# Patient Record
Sex: Female | Born: 1955 | Race: White | Hispanic: No | Marital: Single | Smoking: Former smoker
Health system: Southern US, Community
[De-identification: ages and names within clinical notes are randomized; demographics above are authoritative.]

## PROBLEM LIST (undated history)

## (undated) DIAGNOSIS — M1990 Unspecified osteoarthritis, unspecified site: Secondary | ICD-10-CM

## (undated) DIAGNOSIS — I341 Nonrheumatic mitral (valve) prolapse: Secondary | ICD-10-CM

## (undated) DIAGNOSIS — M810 Age-related osteoporosis without current pathological fracture: Secondary | ICD-10-CM

## (undated) DIAGNOSIS — K219 Gastro-esophageal reflux disease without esophagitis: Secondary | ICD-10-CM

## (undated) DIAGNOSIS — M8580 Other specified disorders of bone density and structure, unspecified site: Principal | ICD-10-CM

## (undated) DIAGNOSIS — R011 Cardiac murmur, unspecified: Secondary | ICD-10-CM

## (undated) HISTORY — DX: Nonrheumatic mitral (valve) prolapse: I34.1

## (undated) HISTORY — DX: Other specified disorders of bone density and structure, unspecified site: M85.80

## (undated) HISTORY — PX: COLONOSCOPY: SHX174

## (undated) HISTORY — DX: Age-related osteoporosis without current pathological fracture: M81.0

## (undated) HISTORY — PX: WISDOM TOOTH EXTRACTION: SHX21

## (undated) HISTORY — PX: BREAST EXCISIONAL BIOPSY: SUR124

## (undated) HISTORY — PX: BREAST SURGERY: SHX581

---

## 2010-06-28 ENCOUNTER — Ambulatory Visit: Payer: Self-pay | Admitting: Internal Medicine

## 2010-06-28 ENCOUNTER — Telehealth: Payer: Self-pay | Admitting: Internal Medicine

## 2010-06-28 ENCOUNTER — Encounter (INDEPENDENT_AMBULATORY_CARE_PROVIDER_SITE_OTHER): Payer: Self-pay | Admitting: *Deleted

## 2010-06-28 ENCOUNTER — Ambulatory Visit: Payer: Self-pay | Admitting: Diagnostic Radiology

## 2010-06-28 ENCOUNTER — Ambulatory Visit (HOSPITAL_BASED_OUTPATIENT_CLINIC_OR_DEPARTMENT_OTHER): Admission: RE | Admit: 2010-06-28 | Discharge: 2010-06-28 | Payer: Self-pay | Admitting: Internal Medicine

## 2010-06-28 DIAGNOSIS — R635 Abnormal weight gain: Secondary | ICD-10-CM | POA: Insufficient documentation

## 2010-06-28 LAB — CONVERTED CEMR LAB
HDL goal, serum: 40 mg/dL
LDL Goal: 160 mg/dL

## 2010-07-07 ENCOUNTER — Other Ambulatory Visit: Admission: RE | Admit: 2010-07-07 | Discharge: 2010-07-07 | Payer: Self-pay | Admitting: Internal Medicine

## 2010-07-07 ENCOUNTER — Ambulatory Visit: Payer: Self-pay | Admitting: Family

## 2010-07-07 ENCOUNTER — Encounter: Payer: Self-pay | Admitting: Internal Medicine

## 2010-07-07 LAB — CONVERTED CEMR LAB
CO2: 24 meq/L (ref 19–32)
Glucose, Bld: 87 mg/dL (ref 70–99)
Pap Smear: NEGATIVE
Potassium: 4.9 meq/L (ref 3.5–5.3)
RBC: 4.14 M/uL (ref 3.87–5.11)
Sodium: 139 meq/L (ref 135–145)
TSH: 2.495 microintl units/mL (ref 0.350–4.500)
Total CHOL/HDL Ratio: 2.7
VLDL: 17 mg/dL (ref 0–40)
WBC: 3.8 10*3/uL — ABNORMAL LOW (ref 4.0–10.5)

## 2010-07-08 ENCOUNTER — Encounter: Payer: Self-pay | Admitting: Internal Medicine

## 2010-07-09 ENCOUNTER — Telehealth: Payer: Self-pay | Admitting: Internal Medicine

## 2010-07-09 ENCOUNTER — Encounter: Payer: Self-pay | Admitting: Internal Medicine

## 2010-07-12 ENCOUNTER — Encounter: Payer: Self-pay | Admitting: Family

## 2010-10-05 NOTE — Letter (Signed)
   [place] [facility] [address] [place], [place]  [address]  [place] Phone: [phone]      July 08, 2010   Tammy Mills [address] [place], [place] [address]  RE:  LAB RESULTS  Dear  Ms. Garrison,  The following is an interpretation of your most recent lab tests.  Please take note of any instructions provided or changes to medications that have resulted from your lab work.  ELECTROLYTES:  Good - no changes needed  KIDNEY FUNCTION TESTS:  Good - no changes needed  LIPID PANEL:  Good - no changes needed Triglyceride: 87   Cholesterol: 152   LDL: 78   HDL: 57   Chol/HDL%:  2.7 Ratio  THYROID STUDIES:  Thyroid studies normal TSH: 2.495     CBC:  Good - no changes needed       Sincerely Yours,    Dr. Thomos Lemons  Appended Document:  mailed

## 2010-10-05 NOTE — Progress Notes (Signed)
Summary: records rec from [facility]  Phone Note Other Incoming   Caller: [facility]  Summary of Call: Records rec from [facility]  Initial call taken by: Roselle Locus,  July 09, 2010 8:06 AM

## 2010-10-05 NOTE — Progress Notes (Signed)
Summary: faxed [facility] for medical records   Phone Note Outgoing Call   Call placed by: Marj Call placed to: [facility]  Summary of Call: faxed [facility] for medical records  Initial call taken by: Roselle Locus,  June 28, 2010 9:41 AM

## 2010-10-05 NOTE — Letter (Signed)
Summary: Pre Visit Letter Revised  [facility]  [address] [place], [place] [address]   Phone: [phone]  Fax: [phone]        06/28/2010 MRN: [id number] Tammy Mills [address] [place], [place]  [address]             Procedure Date:  08-03-10   Welcome to the Gastroenterology Division at [facility].    You are scheduled to see a nurse for your pre-procedure visit on 07-20-10 at 3:30p.m. on the 3rd floor at [facility], [address]  We ask that you try to arrive at our office 15 minutes prior to your appointment time to allow for check-in.  Please take a minute to review the attached form.  If you answer "Yes" to one or more of the questions on the first page, we ask that you call the person listed at your earliest opportunity.  If you answer "No" to all of the questions, please complete the rest of the form and bring it to your appointment.    Your nurse visit will consist of discussing your medical and surgical history, your immediate family medical history, and your medications.   If you are unable to list all of your medications on the form, please bring the medication bottles to your appointment and we will list them.  We will need to be aware of both prescribed and over the counter drugs.  We will need to know exact dosage information as well.    Please be prepared to read and sign documents such as consent forms, a financial agreement, and acknowledgement forms.  If necessary, and with your consent, a friend or relative is welcome to sit-in on the nurse visit with you.  Please bring your insurance card so that we may make a copy of it.  If your insurance requires a referral to see a specialist, please bring your referral form from your primary care physician.  No co-pay is required for this nurse visit.     If you cannot keep your appointment, please call [phone] to cancel or reschedule prior to your appointment date.  This allows [place]  the opportunity to schedule an appointment for another patient in need of care.    Thank you for choosing  Gastroenterology for your medical needs.  We appreciate the opportunity to care for you.  Please visit [place] at our website  to learn more about our practice.  Sincerely, The Gastroenterology Division

## 2010-10-05 NOTE — Letter (Signed)
   [place] at [facility] [address] [place], [place]  [address]  [place] Phone: [phone]      July 12, 2010   Tammy Mills [address] [place], [place] [address]  RE:  LAB RESULTS  Dear  Ms. Ing,  The following is an interpretation of your most recent lab tests.  Please take note of any instructions provided or changes to medications that have resulted from your lab work.  Pap Smear: normal     Sincerely Yours,    Lemont Fillers FNP  Appended Document:  Mailed.

## 2010-10-05 NOTE — Assessment & Plan Note (Signed)
Summary: pap/mhf  flu shot   Vital Signs:  Patient profile:   55 year old female LMP:     09/05/2004 Height:      68 inches Weight:      171.25 pounds BMI:     26.13 Temp:     98.0 degrees F oral Pulse rate:   66 / minute Pulse rhythm:   regular Resp:     16 per minute BP sitting:   108 / 70  (right arm) Cuff size:   regular  Vitals Entered By: Mervin Kung CMA Duncan Dull) (July 07, 2010 9:12 AM) CC: Pt here for pap smear and flu shot. Is Patient Diabetic? No Pain Assessment Patient in pain? no      LMP (date): 09/05/2004     Enter LMP: 09/05/2004   Primary Care Provider:  D. Thomos Lemons DO  CC:  Pt here for pap smear and flu shot..  History of Present Illness: Ms Spurling is a 55 year old female patient of Dr. Olegario Messier who is here today for a screening Pap.  Reports that she has received regular pap smears and they have always been normal.  Last pap was 3 years a go.  Last period was 5 years ago. Not currently sexually active.   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    Physical Exam  General:  Well-developed,well-nourished,in no acute distress; alert,appropriate and cooperative throughout examination Head:  Normocephalic and atraumatic without obvious abnormalities. No apparent alopecia or balding. Breasts:  No mass, nodules, thickening, tenderness, bulging, retraction, inflamation, nipple discharge or skin changes noted.   Lungs:  Normal respiratory effort, chest expands  symmetrically. Lungs are clear to auscultation, no crackles or wheezes. Heart:  Normal rate and regular rhythm. S1 and S2 normal without gallop, murmur, click, rub or other extra sounds. Abdomen:  Bowel sounds positive,abdomen soft and non-tender without masses, organomegaly or hernias noted. Genitalia:  Pelvic Exam:        External: normal female genitalia without lesions or masses        Vagina: normal without lesions or masses- some vaginal atrophy is noted.        Cervix: normal without lesions or masses        Adnexa: normal bimanual exam without masses or fullness        Uterus: normal by palpation        Pap smear: performed   Impression & Recommendations:  Problem # 1:  ROUTINE GYNECOLOGICAL EXAMINATION (ICD-V72.31) Assessment Comment Only  Pap smear performed.  Pt completed mammogram last week.  Fasting lab work performed this AM.   Pt was counseled on SBE.  Flu shot today.    Orders: Specimen Handling ([address])  Other Orders: Admin 1st Vaccine ([address]) Flu Vaccine 57yrs + ([address])  Patient Instructions: 1)  Please follow up with Dr. Artist Pais as scheduled.   Orders Added: 1)  Est. Patient Level III [[address]] 2)  Admin 1st Vaccine [90471] 3)  Flu Vaccine 70yrs + [[address]] 4)  Specimen  Handling [[address]] 5)  Est. Patient Level III [[address]]    Flu Vaccine Consent Questions     Do you have a history of severe allergic reactions to this vaccine? no    Any prior history of allergic reactions to egg and/or gelatin? no    Do you have a sensitivity to the preservative Thimersol? no    Do you have a past history of Guillan-Barre Syndrome? no    Do you currently have an acute febrile illness? no    Have you ever had a severe reaction to latex? no    Vaccine information given and explained to patient? yes    Are you currently pregnant? no    Lot Number:[id number]   Exp Date:03/05/2011   Site Given  Left Deltoid IM.  Nicki Guadalajara Fergerson CMA Duncan Dull)  July 07, 2010 10:01 AM

## 2010-10-05 NOTE — Miscellaneous (Signed)
Summary: bone density  Clinical Lists Changes  Observations: Added new observation of BONE DENSITY: normal (11/23/2006 11:54)      Preventive Care Screening  Bone Density:    Date:  11/23/2006    Results:  normal

## 2010-10-05 NOTE — Assessment & Plan Note (Signed)
Summary: NEW PT EST CARE/DT [employer]   Vital Signs:  Patient profile:   55 year old female Height:      66.5 inches Weight:      172.50 pounds BMI:     27.52 O2 Sat:      99 % on Room air Temp:     97.9 degrees F oral Pulse rate:   70 / minute Pulse rhythm:   regular Resp:     18 per minute BP sitting:   100 / 60  (left arm)  Vitals Entered By: Glendell Docker CMA (June 28, 2010 9:39 AM)  O2 Flow:  Room air CC: New Patient , Lipid Management Is Patient Diabetic? No Pain Assessment Patient in pain? no      Comments establish care, no concerns   Primary Care Provider:  Dondra Spry DO  CC:  New Patient  and Lipid Management.  History of Present Illness: 55 y/o white female to establish and for routine cpx.  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   Lipid Management History:      Positive NCEP/ATP III risk factors include female age 57 years old or older.  Negative NCEP/ATP III risk factors include non-tobacco-user status.    Preventive Screening-Counseling & Management  Alcohol-Tobacco     Alcohol drinks/day: <1     Smoking Status: quit     Packs/Day: 1.0     Year Started: 1974     Year Quit: 2007  Caffeine-Diet-Exercise     Caffeine use/day: 1 beverage daily     Does Patient Exercise: no  Allergies (verified): No Known Drug Allergies  Past History:  Past Surgical History: Right Breast Biopsy 2001  Family History: Family History Breast cancer - MGM Family History of Colon CA - MGF Family History  High cholesterol sister has thyroid problem  Social History: Occupation: Clinical biochemist Rep Single (lives with mom and sister) no children  3 sisters Alcohol use-yes (beer on weekend) Smoking Status:  quit Packs/Day:  1.0 Caffeine use/day:  1 beverage daily Does Patient Exercise:  no  Review of Systems       The patient complains of weight gain.  The patient denies fever, chest pain, syncope, dyspnea on exertion, prolonged cough, abdominal pain, melena, hematochezia, severe indigestion/heartburn, and depression.    Physical Exam  General:  alert.   Head:  normocephalic and atraumatic.   Eyes:  pupils equal, pupils round, and pupils reactive to light.   Ears:  R ear normal and L ear normal.   Mouth:  pharynx pink and moist.   Neck:  No deformities, masses, or tenderness noted.no carotid bruits.   Lungs:  normal respiratory effort, normal breath sounds, no crackles, and no wheezes.   Heart:  normal rate, regular rhythm, no murmur, and no gallop.   Abdomen:  soft, non-tender, normal bowel sounds, no masses, no hepatomegaly, and no splenomegaly.   Extremities:  No lower extremity edema  Neurologic:  cranial nerves II-XII  intact and gait normal.   Psych:  normally interactive, good eye contact, not anxious appearing, and not depressed appearing.     Impression & Recommendations:  Problem # 1:  HEALTH MAINTENANCE EXAM (ICD-V70.0) Reviewed adult health maintenance protocols. Pt counseled on diet and exercise.  Orders: T-Basic Metabolic Panel [id number]) T-Lipid Profile [id number]) T-CBC No Diff ([id number]) T-TSH [id number]) Gastroenterology Referral (GI)  Mammogram: normal (04/22/2008) Pap smear: normal (04/22/2008) Td Booster: Historical (06/20/2005)    Discussed using sunscreen, use of alcohol, drug use, self breast exam, routine dental care, routine eye care, schedule for GYN exam, routine physical exam, seat belts, multiple vitamins, osteoporosis prevention,  adequate calcium intake in diet, recommendations for immunizations, mammograms and Pap smears.  Discussed exercise and checking cholesterol.  Discussed gun safety, safe sex, and contraception.  Other Orders: Mammogram (Screening) (Mammo)  Lipid Assessment/Plan:      Based on NCEP/ATP III, the patient's risk factor category is "0-1 risk factors".  The patient's lipid goals are as follows: Total cholesterol goal is 200; LDL cholesterol goal is 160; HDL cholesterol goal is 40; Triglyceride goal is 150.     Patient Instructions: 1)  [email or web address]-calorie-counter.com/ 2)  Limit your calories to 1200-1400 cal per day. 3)  Please schedule a follow-up appointment in 3 months.   Orders Added: 1)  T-Basic Metabolic Panel [[id number]] 2)  T-Lipid Profile [[id number]] 3)  T-CBC No Diff [[id number]] 4)  T-TSH [[id number]] 5)  Mammogram (Screening) [Mammo] 6)  Gastroenterology Referral [GI] 7)  New Patient 40-64 years [[address]]     Preventive Care Screening  Mammogram:    Date:  04/22/2008    Results:  normal   Pap Smear:    Date:  04/22/2008    Results:  normal   Last Tetanus Booster:    Date:  06/20/2005    Results:  Historical    Current Allergies (reviewed today): No known allergies

## 2011-06-03 ENCOUNTER — Ambulatory Visit (INDEPENDENT_AMBULATORY_CARE_PROVIDER_SITE_OTHER): Payer: 59 | Admitting: Internal Medicine

## 2011-06-03 ENCOUNTER — Encounter: Payer: Self-pay | Admitting: Internal Medicine

## 2011-06-03 VITALS — BP 90/60 | HR 69 | Temp 97.6°F | Resp 16 | Ht 67.0 in | Wt 148.0 lb

## 2011-06-03 DIAGNOSIS — M25561 Pain in right knee: Secondary | ICD-10-CM | POA: Insufficient documentation

## 2011-06-03 DIAGNOSIS — M25562 Pain in left knee: Secondary | ICD-10-CM

## 2011-06-03 DIAGNOSIS — Z23 Encounter for immunization: Secondary | ICD-10-CM

## 2011-06-03 DIAGNOSIS — M25569 Pain in unspecified knee: Secondary | ICD-10-CM

## 2011-06-03 MED ORDER — DICLOFENAC SODIUM 50 MG PO TBEC: DELAYED_RELEASE_TABLET | ORAL | Status: DC

## 2011-06-03 NOTE — Progress Notes (Signed)
  Subjective:    Patient ID: Tammy Mills, female    DOB: May 04, 1956, 55 y.o.   MRN: [id number]  HPI Pt presents to clinic for evaluation of knee pain. Notes one week h/o flare in bilateral knee pain left greater than right. No injury or trauma but did recent walk on uneven ground at [facility]. No instability or falls. Attempted otc tylenol without significant improvement. Notes occasional clicking sound. No other alleviating or exacerbating factors. Denies h/o PUD or CRI. No other complaints.  Reviewed pmh, medications and allergies  Review of Systems     Objective:   Physical Exam  Nursing note and vitals reviewed. Constitutional: She appears well-developed and well-nourished. No distress.  HENT:  Head: Normocephalic and atraumatic.  Eyes: Conjunctivae are normal. No scleral icterus.  Musculoskeletal:       Bilateral knee without erythema, warmth or effusion. FROM bilaterally. Drawer's neg bilaterally. Left mild crepitus noted. Mild tenderness left medial knee ?bursa  Neurological: She is alert.  Skin: Skin is warm and dry. She is not diaphoretic.  Psychiatric: She has a normal mood and affect.          Assessment & Plan:

## 2011-06-03 NOTE — Assessment & Plan Note (Signed)
Attempt voltaren bid x 5 days then bid prn with food and no other nsaids. Followup if no improvement or worsening.

## 2012-05-08 ENCOUNTER — Ambulatory Visit (INDEPENDENT_AMBULATORY_CARE_PROVIDER_SITE_OTHER): Payer: 59 | Admitting: Family Medicine

## 2012-05-08 ENCOUNTER — Encounter: Payer: Self-pay | Admitting: Family Medicine

## 2012-05-08 VITALS — BP 105/71 | HR 71 | Ht 68.0 in | Wt 150.0 lb

## 2012-05-08 DIAGNOSIS — M25551 Pain in right hip: Secondary | ICD-10-CM | POA: Insufficient documentation

## 2012-05-08 DIAGNOSIS — M25559 Pain in unspecified hip: Secondary | ICD-10-CM

## 2012-05-08 DIAGNOSIS — M25561 Pain in right knee: Secondary | ICD-10-CM | POA: Insufficient documentation

## 2012-05-08 DIAGNOSIS — M25569 Pain in unspecified knee: Secondary | ICD-10-CM

## 2012-05-08 NOTE — Patient Instructions (Addendum)
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 Ice over area of pain 3-4 times a day for 15 minutes at a time Hip abduction exercise 3 x 10 once a day - add weights if this becomes too easy. Stretches - pick 2 and hold for 20-30 seconds x 3 - do once or twice a day. Tylenol and/or aleve as needed for pain. If not improving, can consider formal physical therapy and/or steroid injection as discussed. Knee brace as needed for support. Follow up with me in 1 month for reevaluation.

## 2012-05-08 NOTE — Assessment & Plan Note (Signed)
2/2 IT band syndrome.  Start home stretches and strengthening exercises.  Icing, tylenol/aleve as needed.  Relative rest.  If not improving consider trochanteric bursa injection, physical therapy.  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 

## 2012-05-08 NOTE — Progress Notes (Signed)
  Subjective:    Patient ID: Tammy Mills, female    DOB: [birth year], 56 y.o.   MRN: [id number]  PCP: Dr. Rodena Medin  HPI 56 yo F here for right knee and hip pain.  Patient denies known injury. States over last 3 weeks has had slowly worsening lateral right knee and hip pain. About 10 years ago was told bones of knee were rubbing on each other and is known to have genu valgum. Worse with stairs and after prolonged sitting. Knee brace she had is no longer supportive as she's lost weight (no longer fits). Has tried tylenol, naproxen.  Not icing. Not tried any specific stretches or rehab.  History reviewed. No pertinent past medical history.  No current outpatient prescriptions on file prior to visit.    History reviewed. No pertinent past surgical history.  Allergies  Allergen Reactions  . Penicillins     History   Social History  . Marital Status: Single    Spouse Name: N/A    Number of Children: N/A  . Years of Education: N/A   Occupational History  . Not on file.   Social History Main Topics  . Smoking status: Former Games developer  . Smokeless tobacco: Never Used  . Alcohol Use: Yes      2 beers/ 1 glass of wine on weekends  . Drug Use: No  . Sexually Active: Not on file   Other Topics Concern  . Not on file   Social History Narrative  . No narrative on file    Family History  Problem Relation Age of Onset  . Heart attack Neg Hx   . Diabetes Neg Hx   . Hypertension Neg Hx   . Sudden death Neg Hx   . Hyperlipidemia Other     BP 105/71  Pulse 71  Ht 5\' 8"  (1.727 m)  Wt 150 lb (68.04 kg)  BMI 22.81 kg/m2  Review of Systems See HPI above.    Objective:   Physical Exam Gen: NAD  R knee: Genu valgum.  Otherwise no gross deformity, ecchymoses, swelling. TTP greatest distal ITB.  Less TTP lateral joint line, pes bursa.  No med joint line or post patellar facet TTP. FROM. Negative ant/post drawers. Negative valgus/varus testing. Negative lachmanns. Negative  mcmurrays, apleys, patellar apprehension, clarkes. NV intact distally.  R hip: Hip abduction strength 4/5 TTP greater trochanter reproducing pain. Negative logroll    Assessment & Plan:  1. Right knee/hip pain - 2/2 IT band syndrome.  Start home stretches and strengthening exercises.  Icing, tylenol/aleve as needed.  Relative rest.  If not improving consider trochanteric bursa injection, physical therapy.  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

## 2012-05-08 NOTE — Assessment & Plan Note (Signed)
2/2 IT band syndrome.  Start home stretches and strengthening exercises.  Icing, tylenol/aleve as needed.  Relative rest.  If not improving consider trochanteric bursa injection, physical therapy.  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

## 2013-04-30 ENCOUNTER — Encounter: Payer: Self-pay | Admitting: Family

## 2013-04-30 ENCOUNTER — Other Ambulatory Visit: Payer: Self-pay | Admitting: Family

## 2013-04-30 ENCOUNTER — Ambulatory Visit (INDEPENDENT_AMBULATORY_CARE_PROVIDER_SITE_OTHER): Payer: 59 | Admitting: Family

## 2013-04-30 ENCOUNTER — Encounter: Payer: Self-pay | Admitting: Internal Medicine

## 2013-04-30 VITALS — BP 90/60 | HR 67 | Temp 97.9°F | Resp 16 | Ht 67.0 in | Wt 149.0 lb

## 2013-04-30 DIAGNOSIS — Z1231 Encounter for screening mammogram for malignant neoplasm of breast: Secondary | ICD-10-CM

## 2013-04-30 DIAGNOSIS — Z136 Encounter for screening for cardiovascular disorders: Secondary | ICD-10-CM

## 2013-04-30 DIAGNOSIS — Z0000 Encounter for general adult medical examination without abnormal findings: Secondary | ICD-10-CM

## 2013-04-30 LAB — CBC WITH DIFFERENTIAL/PLATELET
HCT: 36.1 % (ref 36.0–46.0)
Hemoglobin: 12.1 g/dL (ref 12.0–15.0)
Lymphocytes Relative: 29 % (ref 12–46)
Lymphs Abs: 1.5 10*3/uL (ref 0.7–4.0)
Monocytes Absolute: 0.7 10*3/uL (ref 0.1–1.0)
Monocytes Relative: 13 % — ABNORMAL HIGH (ref 3–12)
Neutro Abs: 2.9 10*3/uL (ref 1.7–7.7)
RBC: 3.9 MIL/uL (ref 3.87–5.11)
WBC: 5.1 10*3/uL (ref 4.0–10.5)

## 2013-04-30 LAB — HEPATIC FUNCTION PANEL
AST: 17 U/L (ref 0–37)
Albumin: 4.2 g/dL (ref 3.5–5.2)
Total Bilirubin: 0.4 mg/dL (ref 0.3–1.2)
Total Protein: 6.8 g/dL (ref 6.0–8.3)

## 2013-04-30 LAB — LIPID PANEL
Cholesterol: 170 mg/dL (ref 0–200)
Total CHOL/HDL Ratio: 3 Ratio

## 2013-04-30 LAB — BASIC METABOLIC PANEL WITH GFR
CO2: 29 mEq/L (ref 19–32)
Calcium: 9.2 mg/dL (ref 8.4–10.5)
Creat: 0.84 mg/dL (ref 0.50–1.10)

## 2013-04-30 NOTE — Assessment & Plan Note (Signed)
Continue healthy diet, exercise.  Immunizations reviewed and up to date. Obtain fasting lab work, dexa, colo, mammo- pt to schedule pap with gyn.

## 2013-04-30 NOTE — Progress Notes (Signed)
Subjective:    Patient ID: Tammy Mills, female    DOB: 1956-01-18, 57 y.o.   MRN: [id number]  HPI  Patient presents today for complete physical.  Immunizations: tetanus is up to date.  Diet: report healthy diet Exercise: has been walking on treadmill and using a total gym. Colonoscopy:due Dexa: due Pap Smear: due- she will arrange for GYN. Mammogram: due     Review of Systems  Constitutional: Negative for unexpected weight change.  HENT: Negative for congestion.   Eyes: Negative for visual disturbance.  Respiratory: Negative for cough.   Cardiovascular: Negative for chest pain and leg swelling.  Gastrointestinal: Negative for vomiting, diarrhea and constipation.  Genitourinary: Negative for dysuria, frequency and hematuria.  Musculoskeletal: Positive for arthralgias. Negative for myalgias.       Knees hip  Skin: Negative for rash.  Neurological: Negative for headaches.  Hematological: Negative for adenopathy.  Psychiatric/Behavioral:       Denies depression/anxiety   History reviewed. No pertinent past medical history.  History   Social History  . Marital Status: Single    Spouse Name: N/A    Number of Children: N/A  . Years of Education: N/A   Occupational History  . Not on file.   Social History Main Topics  . Smoking status: Former Games developer  . Smokeless tobacco: Never Used  . Alcohol Use: Yes     Comment:  2 beers/ 1 glass of wine on weekends  . Drug Use: No  . Sexual Activity: Not on file   Other Topics Concern  . 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 No pertinent past surgical history.  Family History  Problem Relation Age of Onset  . Heart attack Neg Hx   . Diabetes Neg Hx   . Hypertension Neg Hx   . Sudden death Neg Hx   . Hyperlipidemia Other   .  Stroke Mother     Allergies  Allergen Reactions  . Penicillins     No current outpatient prescriptions on file prior to visit.   No current facility-administered medications on file prior to visit.    BP 90/60  Pulse 67  Temp(Src) 97.9 F (36.6 C) (Oral)  Resp 16  Ht 5\' 7"  (1.702 m)  Wt 149 lb (67.586 kg)  BMI 23.33 kg/m2  SpO2 99%       Objective:   Physical Exam  Physical Exam  Constitutional: She is oriented to person, place, and time. She appears well-developed and well-nourished. No distress.  HENT:  Head: Normocephalic and atraumatic.  Right Ear: Tympanic membrane and ear canal normal.  Left Ear: Tympanic membrane and ear canal normal.  Mouth/Throat: Oropharynx is clear and moist.  Eyes: Pupils are equal, round, and reactive to light. No scleral icterus.  Neck: Normal range of motion. No thyromegaly present.  Cardiovascular: Normal rate and regular rhythm.   No murmur heard. Pulmonary/Chest: Effort normal and breath sounds normal. No respiratory distress. He has no wheezes. She has no rales. She exhibits no tenderness.  Abdominal: Soft. Bowel sounds are normal. He exhibits no distension and no mass. There is no tenderness. There is no rebound and no guarding.  Musculoskeletal: She exhibits no edema.  Lymphadenopathy:    She has no cervical adenopathy.  Neurological: She  is alert and oriented to person, place, and time. She exhibits normal muscle tone. Coordination normal.  Skin: Skin is warm and dry.  Psychiatric: She has a normal mood and affect. Her behavior is normal. Judgment and thought content normal.  Breasts: Examined lying Right: Without masses, retractions, discharge or axillary adenopathy.  Left: Without masses, retractions, discharge or axillary adenopathy.  Pelvic: deferred to gyn          Assessment & Plan:          Assessment & Plan:

## 2013-04-30 NOTE — Patient Instructions (Addendum)
Please schedule your mammogram on the first floor. Schedule bone density at the front desk. Complete lab work prior to leaving. Arrange a Pap smear with GYN. Follow up in 1 year sooner if problems/concerns.

## 2013-05-01 ENCOUNTER — Ambulatory Visit (INDEPENDENT_AMBULATORY_CARE_PROVIDER_SITE_OTHER)
Admission: RE | Admit: 2013-05-01 | Discharge: 2013-05-01 | Disposition: A | Discharge status: Home / Self Care | Payer: 59 | Source: Ambulatory Visit | Attending: Family | Admitting: Family

## 2013-05-01 ENCOUNTER — Encounter: Payer: Self-pay | Admitting: Family

## 2013-05-01 DIAGNOSIS — Z0000 Encounter for general adult medical examination without abnormal findings: Secondary | ICD-10-CM

## 2013-05-01 LAB — URINALYSIS, ROUTINE W REFLEX MICROSCOPIC
Glucose, UA: NEGATIVE mg/dL
Hgb urine dipstick: NEGATIVE
Leukocytes, UA: NEGATIVE
Specific Gravity, Urine: 1.018 (ref 1.005–1.030)
pH: 7.5 (ref 5.0–8.0)

## 2013-05-03 ENCOUNTER — Ambulatory Visit (HOSPITAL_BASED_OUTPATIENT_CLINIC_OR_DEPARTMENT_OTHER)
Admission: RE | Admit: 2013-05-03 | Discharge: 2013-05-03 | Disposition: A | Discharge status: Home / Self Care | Payer: 59 | Source: Ambulatory Visit | Attending: Family | Admitting: Family

## 2013-05-03 DIAGNOSIS — Z1231 Encounter for screening mammogram for malignant neoplasm of breast: Secondary | ICD-10-CM | POA: Insufficient documentation

## 2013-05-07 ENCOUNTER — Telehealth: Payer: Self-pay | Admitting: Family

## 2013-05-07 ENCOUNTER — Encounter: Payer: Self-pay | Admitting: Family

## 2013-05-07 DIAGNOSIS — M8580 Other specified disorders of bone density and structure, unspecified site: Secondary | ICD-10-CM | POA: Insufficient documentation

## 2013-05-07 NOTE — Telephone Encounter (Signed)
Spoke with the pt and informed her of her recent BDS results and note below.  Pt understood and agreed.  Future Vit D ordered and sent.//AB/CMA

## 2013-05-07 NOTE — Telephone Encounter (Signed)
Please call pt and let her know that bone density shows osteopenia "bone thinning."  She should continue calcium chews with vitamin D.  She should ensure 1200 mg of calcium a day through supplement and diet.   Return to the lab at her earliest convenience for vitamin D level. We will plan to repeat bone density in 2 years.

## 2013-07-01 ENCOUNTER — Ambulatory Visit (AMBULATORY_SURGERY_CENTER): Payer: Self-pay | Admitting: *Deleted

## 2013-07-01 VITALS — Ht 67.0 in | Wt 152.0 lb

## 2013-07-01 DIAGNOSIS — Z1211 Encounter for screening for malignant neoplasm of colon: Secondary | ICD-10-CM

## 2013-07-01 MED ORDER — MOVIPREP 100 G PO SOLR: ORAL | Status: DC

## 2013-07-01 NOTE — Progress Notes (Signed)
Patient denies any allergies to eggs or soy. 

## 2013-07-04 ENCOUNTER — Encounter: Payer: Self-pay | Admitting: Internal Medicine

## 2013-07-15 ENCOUNTER — Ambulatory Visit (AMBULATORY_SURGERY_CENTER): Payer: 59 | Admitting: Internal Medicine

## 2013-07-15 ENCOUNTER — Encounter: Payer: Self-pay | Admitting: Internal Medicine

## 2013-07-15 VITALS — BP 120/79 | HR 56 | Temp 97.1°F | Resp 40 | Ht 67.0 in | Wt 152.0 lb

## 2013-07-15 DIAGNOSIS — Z1211 Encounter for screening for malignant neoplasm of colon: Secondary | ICD-10-CM

## 2013-07-15 MED ORDER — SODIUM CHLORIDE 0.9 % IV SOLN: 500.0000 mL | INTRAVENOUS | Status: DC

## 2013-07-15 NOTE — Op Note (Signed)
[facility] [address] [place], [address]   COLONOSCOPY PROCEDURE REPORT  PATIENT: Mills, Tammy  MR#: [id number] BIRTHDATE: 1955-12-10 , 57  yrs. old GENDER: Female ENDOSCOPIST: Roxy Cedar, MD REFERRED AO:[id number] Peggyann Juba, FNP PROCEDURE DATE:  07/15/2013 PROCEDURE:   Colonoscopy, screening First Screening Colonoscopy - Avg.  risk and is 50 yrs.  old or older Yes.  Prior Negative Screening - Now for repeat screening. N/A  History of Adenoma - Now for follow-up colonoscopy & has been > or = to 3 yrs.  N/A  Polyps Removed Today? No.  Recommend repeat exam, <10 yrs? No. ASA CLASS:   Class I INDICATIONS:average risk screening. MEDICATIONS: MAC sedation, administered by CRNA and propofol (Diprivan) 300mg  IV  DESCRIPTION OF PROCEDURE:   After the risks benefits and alternatives of the procedure were thoroughly explained, informed consent was obtained.  A digital rectal exam revealed no abnormalities of the rectum.   The LB [id number]  endoscope was introduced through the anus and advanced to the cecum, which was identified by both the appendix and ileocecal valve. No adverse events experienced.   The quality of the prep was excellent, using MoviPrep  The instrument was then slowly withdrawn as the colon was fully examined.   COLON FINDINGS: Mild diverticulosis was noted in the sigmoid colon. The colon was otherwise normal.  There was no inflammation, polyps or cancers .  Retroflexed views revealed no abnormalities. The time to cecum=3 minutes 48 seconds.  Withdrawal time=7 minutes 39 seconds.  The scope was withdrawn and the procedure completed.  COMPLICATIONS: There were no complications.  ENDOSCOPIC IMPRESSION: 1.   Mild diverticulosis was noted in the sigmoid colon 2.   The colon was otherwise normal  RECOMMENDATIONS: 1. Continue current colorectal screening recommendations for "routine risk" patients with a repeat colonoscopy in 10  years.   eSigned:  Roxy Cedar, MD 07/15/2013 9:34 AM   cc: Sandford Craze, Mena Goes and The Patient

## 2013-07-15 NOTE — Patient Instructions (Signed)
YOU HAD AN ENDOSCOPIC PROCEDURE TODAY AT THE [facility]: Refer to the procedure report that was given to you for any specific questions about what was found during the examination.  If the procedure report does not answer your questions, please call your gastroenterologist to clarify.  If you requested that your care partner not be given the details of your procedure findings, then the procedure report has been included in a sealed envelope for you to review at your convenience later.  YOU SHOULD EXPECT: Some feelings of bloating in the abdomen. Passage of more gas than usual.  Walking can help get rid of the air that was put into your GI tract during the procedure and reduce the bloating. If you had a lower endoscopy (such as a colonoscopy or flexible sigmoidoscopy) you may notice spotting of blood in your stool or on the toilet paper. If you underwent a bowel prep for your procedure, then you may not have a normal bowel movement for a few days.  DIET: Your first meal following the procedure should be a light meal and then it is ok to progress to your normal diet.  A half-sandwich or bowl of soup is an example of a good first meal.  Heavy or fried foods are harder to digest and may make you feel nauseous or bloated.  Likewise meals heavy in dairy and vegetables can cause extra gas to form and this can also increase the bloating.  Drink plenty of fluids but you should avoid alcoholic beverages for 24 hours.  ACTIVITY: Your care partner should take you home directly after the procedure.  You should plan to take it easy, moving slowly for the rest of the day.  You can resume normal activity the day after the procedure however you should NOT DRIVE or use heavy machinery for 24 hours (because of the sedation medicines used during the test).    SYMPTOMS TO REPORT IMMEDIATELY: A gastroenterologist can be reached at any hour.  During normal business hours, 8:30 AM to 5:00 PM Monday through Friday,  call [phone].  After hours and on weekends, please call the GI answering service at [phone] who will take a message and have the physician on call contact you.   Following lower endoscopy (colonoscopy or flexible sigmoidoscopy):  Excessive amounts of blood in the stool  Significant tenderness or worsening of abdominal pains  Swelling of the abdomen that is new, acute  Fever of 100F or higher    FOLLOW UP: If any biopsies were taken you will be contacted by phone or by letter within the next 1-3 weeks.  Call your gastroenterologist if you have not heard about the biopsies in 3 weeks.  Our staff will call the home number listed on your records the next business day following your procedure to check on you and address any questions or concerns that you may have at that time regarding the information given to you following your procedure. This is a courtesy call and so if there is no answer at the home number and we have not heard from you through the emergency physician on call, we will assume that you have returned to your regular daily activities without incident.  SIGNATURES/CONFIDENTIALITY: You and/or your care partner have signed paperwork which will be entered into your electronic medical record.  These signatures attest to the fact that that the information above on your After Visit Summary has been reviewed and is understood.  Full responsibility of the confidentiality   of this discharge information lies with you and/or your care-partner.   Diverticulosis & high fiber diet information given to you today

## 2013-07-15 NOTE — Progress Notes (Signed)
Procedure ends, to recovery, report given and VSS. 

## 2013-07-15 NOTE — Progress Notes (Signed)
Patient did not experience any of the following events: a burn prior to discharge; a fall within the facility; wrong site/side/patient/procedure/implant event; or a hospital transfer or hospital admission upon discharge from the facility. (G8907) Patient did not have preoperative order for IV antibiotic SSI prophylaxis. (G8918)  

## 2013-07-16 ENCOUNTER — Telehealth: Payer: Self-pay | Admitting: *Deleted

## 2013-07-16 NOTE — Telephone Encounter (Signed)
No answer left message to call if questions or concerns. 

## 2013-07-26 ENCOUNTER — Telehealth: Payer: Self-pay | Admitting: Family

## 2013-07-26 NOTE — Telephone Encounter (Signed)
Left message requesting that pt return my call.  I have additional questions for her RE: FMLA paperwork for her mom.

## 2013-07-30 NOTE — Telephone Encounter (Signed)
Per verbal from Provider, FMLA forms completed and pt's daughter will pick them up. Originals placed at the front desk and copy sent for scanning.

## 2014-04-28 ENCOUNTER — Encounter: Payer: 59 | Admitting: Family

## 2014-04-28 DIAGNOSIS — Z0289 Encounter for other administrative examinations: Secondary | ICD-10-CM

## 2019-06-26 ENCOUNTER — Encounter: Payer: Self-pay | Admitting: Physician Assistant

## 2019-06-26 ENCOUNTER — Other Ambulatory Visit: Payer: Self-pay

## 2019-06-26 ENCOUNTER — Ambulatory Visit: Payer: Self-pay

## 2019-06-26 ENCOUNTER — Ambulatory Visit (INDEPENDENT_AMBULATORY_CARE_PROVIDER_SITE_OTHER): Payer: 59 | Admitting: Physician Assistant

## 2019-06-26 DIAGNOSIS — M1712 Unilateral primary osteoarthritis, left knee: Secondary | ICD-10-CM | POA: Diagnosis not present

## 2019-06-26 DIAGNOSIS — M7062 Trochanteric bursitis, left hip: Secondary | ICD-10-CM | POA: Diagnosis not present

## 2019-06-26 MED ORDER — LIDOCAINE HCL 1 % IJ SOLN
3.0000 mL | INTRAMUSCULAR | Status: AC | PRN
  Administered 2019-06-26: 15:00:00 3 mL

## 2019-06-26 MED ORDER — METHYLPREDNISOLONE ACETATE 40 MG/ML IJ SUSP
40.0000 mg | INTRAMUSCULAR | Status: AC | PRN
  Administered 2019-06-26: 15:00:00 40 mg via INTRA_ARTICULAR

## 2019-06-26 NOTE — Progress Notes (Signed)
Office Visit Note   Patient: Tammy Mills           Date of Birth: [birth year]           MRN: [id number] Visit Date: 06/26/2019              Requested by: Debbrah Alar, NP [address] [place],  [place] [address] PCP: Debbrah Alar, NP   Assessment & Plan: Visit Diagnoses:  1. Trochanteric bursitis of left hip   2. Primary osteoarthritis of left knee     Plan: She shown exercises stretching exercises for the trochanteric bursitis.  In regards to her knees see how well she does with the cortisone injection.  She is not interested in any type of surgery at this point time.  She understands she needs to wait at least 3 months between cortisone injections.  Questions were encouraged and answered at length  Follow-Up Instructions: Return if symptoms worsen or fail to improve.   Orders:  Orders Placed This Encounter  Procedures  . Large Joint Inj: L knee  . Large Joint Inj: L greater trochanter  . XR HIP UNILAT W OR W/O PELVIS 2-3 VIEWS LEFT  . XR Knee 1-2 Views Left   No orders of the defined types were placed in this encounter.     Procedures: Large Joint Inj: L knee on 06/26/2019 3:10 PM Indications: pain Details: 22 G 1.5 in needle, anterolateral approach  Arthrogram: No  Medications: 3 mL lidocaine 1 %; 40 mg methylPREDNISolone acetate 40 MG/ML Outcome: tolerated well, no immediate complications Procedure, treatment alternatives, risks and benefits explained, specific risks discussed. Consent was given by the patient. Immediately prior to procedure a time out was called to verify the correct patient, procedure, equipment, support staff and site/side marked as required. Patient was prepped and draped in the usual sterile fashion.   Large Joint Inj: L greater trochanter on 06/26/2019 3:10 PM Indications: pain Details: 22 G 1.5 in needle, lateral approach  Arthrogram: No  Medications: 3 mL lidocaine 1 %; 40 mg methylPREDNISolone acetate 40 MG/ML  Outcome: tolerated well, no immediate complications Procedure, treatment alternatives, risks and benefits explained, specific risks discussed. Consent was given by the patient. Immediately prior to procedure a time out was called to verify the correct patient, procedure, equipment, support staff and site/side marked as required. Patient was prepped and draped in the usual sterile fashion.       Clinical Data: No additional findings.   Subjective: Chief Complaint  Patient presents with  . Left Hip - Pain  . Left Knee - Pain    HPI Tammy Mills is a 63 year old female comes in today with left hip and left knee pain.  She denies any groin pain on the left.  She has mostly dull lateral pain in the left hip has difficulty sleeping on the left side.  She has pain in the left anterior aspect of the knee unable to walk for long periods due to the pain.  States her knees feel very stiff and feel as if they locked up and grind.  She is unable to sleep due to the pain.  She notes some swelling knees at times.  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  She is tried ice and naproxen with minimal relief.  Review of Systems Negative for fevers chills shortness of breath chest pain.  Objective: Vital Signs: There were no vitals taken for this visit.  Physical Exam Pulmonary:  Effort: Pulmonary effort is normal.    General: Well-developed well-nourished female no acute distress. Psych: Alert and oriented x3.  Ortho Exam Bilateral knees she has valgus deformities of both knees.  Tenderness lateral joint line left knee.  No significant tenderness right knee.  Patellofemoral crepitus both knees with passive range of motion.  No instability valgus varus stressing of either knee.  No abnormal warmth erythema fusion of either knee. Bilateral hips good range of motion without pain.  She has tenderness over the trochanteric region both hips left greater than right. Specialty Comments:  No  specialty comments available.  Imaging: Xr Hip Unilat W Or W/o Pelvis 2-3 Views Left  Result Date: 06/26/2019 AP pelvis lateral view left hip: Bilateral hips well maintained well located.  No acute fractures.  No bony abnormalities.  Xr Knee 1-2 Views Left  Result Date: 06/26/2019 Left knee 2 views: Valgus deformity with bone-on-bone lateral compartment and severe patellofemoral changes.  No acute fracture.    PMFS History: Patient Active Problem List   Diagnosis Date Noted  . Osteopenia 05/07/2013  . Routine general medical examination at a health care facility 04/30/2013  . Right knee pain 05/08/2012  . Right hip pain 05/08/2012  . Knee pain, bilateral 06/03/2011  . WEIGHT GAIN 06/28/2010   Past Medical History:  Diagnosis Date  . Osteopenia 05/07/2013    Family History  Problem Relation Age of Onset  . Heart attack Neg Hx   . Diabetes Neg Hx   . Hypertension Neg Hx   . Sudden death Neg Hx   . Hyperlipidemia Other   . Stroke Mother   . Colon cancer Maternal Grandfather 21    Past Surgical History:  Procedure Laterality Date  . WISDOM TOOTH EXTRACTION     Social History   Occupational History  . Not on file  Tobacco Use  . Smoking status: Former Smoker    Quit date: 09/05/2005    Years since quitting: 13.8  . Smokeless tobacco: Never Used  Substance and Sexual Activity  . Alcohol use: Yes    Alcohol/week: 3.0 standard drinks    Types: 1 Glasses of wine, 2 Cans of beer per week    Comment:  2 beers/ 1 glass of wine on weekends  . Drug use: No  . Sexual activity: Not on file

## 2019-10-27 ENCOUNTER — Ambulatory Visit: Payer: 59 | Attending: Internal Medicine

## 2019-10-27 DIAGNOSIS — Z23 Encounter for immunization: Secondary | ICD-10-CM

## 2019-10-27 NOTE — Progress Notes (Signed)
   Covid-19 Vaccination Clinic  Name:  Tammy Mills    MRN: [id number] DOB: 09-05-1956  10/27/2019  [name] was observed post Covid-19 immunization for 15 minutes without incidence. She was provided with Vaccine Information Sheet and instruction to access the V-Safe system.   [name] was instructed to call 911 with any severe reactions post vaccine: Marland Kitchen Difficulty breathing  . Swelling of your face and throat  . A fast heartbeat  . A bad rash all over your body  . Dizziness and weakness    Immunizations Administered    Name Date Dose VIS Date Route   Pfizer COVID-19 Vaccine 10/27/2019  8:28 AM 0.3 mL 08/16/2019 Intramuscular   Manufacturer: [place]   Lot: [id number]   [place]: [id number]

## 2019-11-25 ENCOUNTER — Encounter (HOSPITAL_COMMUNITY): Payer: Self-pay | Admitting: Emergency Medicine

## 2019-11-25 ENCOUNTER — Emergency Department (HOSPITAL_COMMUNITY)
Admission: EM | Admit: 2019-11-25 | Discharge: 2019-11-26 | Disposition: A | Discharge status: Home / Self Care | Payer: 59 | Attending: Emergency Medicine | Admitting: Emergency Medicine

## 2019-11-25 ENCOUNTER — Emergency Department (HOSPITAL_COMMUNITY): Payer: 59

## 2019-11-25 DIAGNOSIS — R55 Syncope and collapse: Secondary | ICD-10-CM

## 2019-11-25 DIAGNOSIS — Z79899 Other long term (current) drug therapy: Secondary | ICD-10-CM | POA: Diagnosis not present

## 2019-11-25 DIAGNOSIS — Z87891 Personal history of nicotine dependence: Secondary | ICD-10-CM | POA: Diagnosis not present

## 2019-11-25 DIAGNOSIS — I951 Orthostatic hypotension: Secondary | ICD-10-CM | POA: Insufficient documentation

## 2019-11-25 LAB — BASIC METABOLIC PANEL
Anion gap: 13 (ref 5–15)
BUN: 17 mg/dL (ref 8–23)
CO2: 23 mmol/L (ref 22–32)
Calcium: 9.4 mg/dL (ref 8.9–10.3)
Chloride: 103 mmol/L (ref 98–111)
Creatinine, Ser: 0.9 mg/dL (ref 0.44–1.00)
GFR calc Af Amer: >60 mL/min (ref >60)
GFR calc non Af Amer: >60 mL/min (ref >60)
Glucose, Bld: 105 mg/dL — ABNORMAL HIGH (ref 70–99)
Potassium: 4.1 mmol/L (ref 3.5–5.1)
Sodium: 139 mmol/L (ref 135–145)

## 2019-11-25 LAB — CBC
HCT: 40.3 % (ref 36.0–46.0)
Hemoglobin: 13.4 g/dL (ref 12.0–15.0)
MCH: 31.6 pg (ref 26.0–34.0)
MCHC: 33.3 g/dL (ref 30.0–36.0)
MCV: 95 fL (ref 80.0–100.0)
Platelets: 279 10*3/uL (ref 150–400)
RBC: 4.24 MIL/uL (ref 3.87–5.11)
RDW: 12.7 % (ref 11.5–15.5)
WBC: 5.3 10*3/uL (ref 4.0–10.5)
nRBC: 0 % (ref 0.0–0.2)

## 2019-11-25 LAB — TROPONIN I (HIGH SENSITIVITY)
Troponin I (High Sensitivity): 4 ng/L (ref <18)
Troponin I (High Sensitivity): 4 ng/L (ref <18)

## 2019-11-25 MED ORDER — SODIUM CHLORIDE 0.9% FLUSH: 3.0000 mL | Freq: Once | INTRAVENOUS | Status: DC

## 2019-11-25 NOTE — ED Notes (Signed)
cbg 128 

## 2019-11-25 NOTE — ED Triage Notes (Signed)
Pt here from home with c/o dizziness no syncopal episodes , no loc . Pt was getting ready for work

## 2019-11-26 ENCOUNTER — Other Ambulatory Visit: Payer: Self-pay

## 2019-11-26 NOTE — Discharge Instructions (Signed)
Your blood pressure did drop when you stood up which could mean you are somewhat dehydraed. Please drink plenty of fluids.  Return if you are having any problems.

## 2019-11-26 NOTE — ED Provider Notes (Signed)
[facility] EMERGENCY DEPARTMENT Provider Note   CSN: [id number] Arrival date & time: 11/25/19  1746   History Chief complaint: Syncope  Tammy Mills is a 64 y.o. female.  The history is provided by the patient.  She has no significant medical history and comes in following a syncopal episode at home.  She states she was taking a shower and as she got up, she felt weak and lightheaded and had syncope with brief loss of consciousness.  There was associated nausea but she did not vomit.  After she passed out, her sister came and noted that she was very pale.  When she tried to sit up, she got dizzy and had to lay back down.  She feels that about the time she got to the hospital, she felt like she was back to normal.  She denies chest pain and denies palpitations.  She has not had similar episodes in the past.  Past Medical History:  Diagnosis Date  . Osteopenia 05/07/2013    Patient Active Problem List   Diagnosis Date Noted  . Osteopenia 05/07/2013  . Routine general medical examination at a health care facility 04/30/2013  . Right knee pain 05/08/2012  . Right hip pain 05/08/2012  . Knee pain, bilateral 06/03/2011  . WEIGHT GAIN 06/28/2010    Past Surgical History:  Procedure Laterality Date  . WISDOM TOOTH EXTRACTION       OB History   No obstetric history on file.     Family History  Problem Relation Age of Onset  . Stroke Mother   . Colon cancer Maternal Grandfather 25  . Hyperlipidemia Other   . Heart attack Neg Hx   . Diabetes Neg Hx   . Hypertension Neg Hx   . Sudden death Neg Hx     Social History   Tobacco Use  . Smoking status: Former Smoker    Quit date: 09/05/2005    Years since quitting: 14.2  . Smokeless tobacco: Never Used  Substance Use Topics  . Alcohol use: Yes    Alcohol/week: 3.0 standard drinks    Types: 1 Glasses of wine, 2 Cans of beer per week    Comment:  2 beers/ 1 glass of wine on weekends  . Drug use: No    Home  Medications Prior to Admission medications   Medication Sig Start Date End Date Taking? Authorizing Provider  acetaminophen (TYLENOL) 500 MG tablet Take 1,000 mg by mouth as needed for pain.    [provider]  Cholecalciferol (VITAMIN D PO) Take 1 tablet by mouth daily.    [provider]  Glucosamine HCl-MSM 1500-500 MG/30ML LIQD Take 1 each by mouth daily.    [provider]  KRILL OIL PO Take 1 tablet by mouth daily.    [provider]  Multiple Minerals-Vitamins ([employer] CALCIUM 600 PLUS) CHEW Chew by mouth.    [provider]  Multiple Vitamin (MULTIVITAMIN) tablet Take 1 tablet by mouth daily.    [provider]  naproxen sodium (ANAPROX) 220 MG tablet Take 660 mg by mouth 2 (two) times daily.    [provider]    Allergies    Penicillins  Review of Systems   Review of Systems  All other systems reviewed and are negative.   Physical Exam Updated Vital Signs BP 115/74   Pulse 74   Temp 97.8 F (36.6 C)   Resp 17   SpO2 98%   Physical Exam Vitals and nursing  note reviewed.   64 year old female, resting comfortably and in no acute distress. Vital signs are normal. Oxygen saturation is 98%, which is normal. Head is normocephalic and atraumatic. PERRLA, EOMI. Oropharynx is clear. Neck is nontender and supple without adenopathy or JVD.  There are no carotid bruits. Back is nontender and there is no CVA tenderness. Lungs are clear without rales, wheezes, or rhonchi. Chest is nontender. Heart has regular rate and rhythm without murmur. Abdomen is soft, flat, nontender without masses or hepatosplenomegaly and peristalsis is normoactive. Extremities have no cyanosis or edema, full range of motion is present. Skin is warm and dry without rash. Neurologic: Mental status is normal, cranial nerves are intact, there are no motor or sensory deficits.  ED Results / Procedures / Treatments   Labs (all labs ordered are  listed, but only abnormal results are displayed) Labs Reviewed  BASIC METABOLIC PANEL - Abnormal; Notable for the following components:      Result Value   Glucose, Bld 105 (*)    All other components within normal limits  CBC  URINALYSIS, ROUTINE W REFLEX MICROSCOPIC  TROPONIN I (HIGH SENSITIVITY)  TROPONIN I (HIGH SENSITIVITY)    EKG EKG Interpretation  Date/Time:  Monday November 25 2019 17:47:43 EDT Ventricular Rate:  77 PR Interval:  124 QRS Duration: 78 QT Interval:  394 QTC Calculation: 445 R Axis:   82 Text Interpretation: Normal sinus rhythm Normal ECG No old tracing to compare Confirmed by Delora Fuel ([id number]) on 11/26/2019 12:46:50 AM   Radiology DG Chest 2 View  Result Date: 11/25/2019 CLINICAL DATA:  Nausea, dizziness, near syncope EXAM: CHEST - 2 VIEW COMPARISON:  None. FINDINGS: Frontal and lateral views of the chest demonstrate an unremarkable cardiac silhouette. There is background emphysema with scattered parenchymal scarring. No airspace disease, effusion, or pneumothorax. No acute bony abnormalities. IMPRESSION: 1. Background emphysema.  No superimposed airspace disease. Electronically Signed   By: Randa Ngo M.D.   On: 11/25/2019 18:28    Procedures Procedures  Medications Ordered in ED Medications  sodium chloride flush (NS) 0.9 % injection 3 mL (has no administration in time range)    ED Course  I have reviewed the triage vital signs and the nursing notes.  Pertinent labs & imaging results that were available during my care of the patient were reviewed by me and considered in my medical decision making (see chart for details).  MDM Rules/Calculators/A&P Syncope which appears to be vasovagal based on symptoms.  No red flags to suggest more serious causes of syncope.  ECG is normal, chest x-ray shows possible COPD.  Labs are unremarkable including normal troponin x2.  Patient is at low risk of major adverse events per [facility] syncope rule.  Will  check orthostatic vital signs and, if not abnormal, will anticipate discharge.  Orthostatic vital signs did show a significant drop in blood pressure, but not to a dangerous range and patient was not dizzy when she stood up.  She is felt to be safe for discharge as there is encouraged to drink fluids at home to maintain hydration.  Return precautions discussed.  Final Clinical Impression(s) / ED Diagnoses Final diagnoses:  Vasovagal syncope  Orthostatic hypotension    Rx / DC Orders ED Discharge Orders    None       Delora Fuel, MD 70/96/28 [phone]

## 2019-11-27 ENCOUNTER — Ambulatory Visit: Payer: 59 | Attending: Internal Medicine

## 2019-11-27 DIAGNOSIS — Z23 Encounter for immunization: Secondary | ICD-10-CM

## 2019-11-27 NOTE — Progress Notes (Signed)
   Covid-19 Vaccination Clinic  Name:  Tammy Mills    MRN: [id number] DOB: 1955-12-04  11/27/2019  [name] was observed post Covid-19 immunization for 15 minutes without incident. She was provided with Vaccine Information Sheet and instruction to access the V-Safe system.   [name] was instructed to call 911 with any severe reactions post vaccine: Marland Kitchen Difficulty breathing  . Swelling of face and throat  . A fast heartbeat  . A bad rash all over body  . Dizziness and weakness   Immunizations Administered    Name Date Dose VIS Date Route   Pfizer COVID-19 Vaccine 11/27/2019  8:24 AM 0.3 mL 08/16/2019 Intramuscular   Manufacturer: [place]   Lot: [id number]   [place]: [address]-1

## 2019-12-02 ENCOUNTER — Ambulatory Visit (INDEPENDENT_AMBULATORY_CARE_PROVIDER_SITE_OTHER): Payer: 59 | Admitting: Family Medicine

## 2019-12-02 ENCOUNTER — Encounter: Payer: Self-pay | Admitting: Family Medicine

## 2019-12-02 ENCOUNTER — Other Ambulatory Visit: Payer: Self-pay

## 2019-12-02 VITALS — BP 138/78 | HR 60 | Temp 97.2°F | Resp 14 | Ht 67.0 in | Wt 156.0 lb

## 2019-12-02 DIAGNOSIS — Z1231 Encounter for screening mammogram for malignant neoplasm of breast: Secondary | ICD-10-CM

## 2019-12-02 DIAGNOSIS — Z13220 Encounter for screening for lipoid disorders: Secondary | ICD-10-CM | POA: Diagnosis not present

## 2019-12-02 DIAGNOSIS — Z780 Asymptomatic menopausal state: Secondary | ICD-10-CM | POA: Diagnosis not present

## 2019-12-02 DIAGNOSIS — R55 Syncope and collapse: Secondary | ICD-10-CM

## 2019-12-02 DIAGNOSIS — Z7689 Persons encountering health services in other specified circumstances: Secondary | ICD-10-CM

## 2019-12-02 NOTE — Progress Notes (Signed)
Subjective:    Patient ID: Tammy Mills, female    DOB: 07/15/1956, 64 y.o.   MRN: [id number]  HPI  Patient is a very pleasant 64 year old Caucasian female here today to establish care.  Recently had to go to the emergency room after having a syncopal episode.  She was taking a shower that she estimates was 10 minutes however it was a hot shower.  Immediately after getting out of the shower, the patient states that she was wiping down the walls of the shower.  This involved her bending over and changing positions.  As she stood up, she started to feel lightheaded like she may pass out.  She sat down on the hamper and called to her family for assistance.  Her sister states that she was very pale in color and her toes even turn purple.  She had to be helped to the floor where she believes she may have passed out momentarily.  EMS was called and she was taken to the emergency room.  EKG in the emergency room shows normal sinus rhythm with normal intervals and a normal axis and no evidence of ischemia or infarction.  Particular there was no evidence of long QT syndrome or Parada syndrome.  There is no family history of sudden cardiac death or cardiac arrhythmias or hypertrophic cardiomyopathy.  Patient states that she had a similar episode approximately 1 month ago.  Again she was taking a hot shower and then went downstairs to do something and felt extremely lightheaded.  She had to sit down but did not pass out.  Vasovagal syncope runs in her family.  Her sister had a syncopal episode due to abdominal pain with defecation.  Patient reports occasional palpitations but no tachyarrhythmias.  She denies any shortness of breath or chest pain or dyspnea on exertion.  She is overdue for mammogram.  She is overdue for a Pap smear.  Her last colonoscopy was 2014.  She is due for a pneumonia vaccine however she is scheduled to receive the Covid shot soon and therefore would like to defer the vaccines today.  I did review  the lab work from the hospital.  Troponins were negative x2.  Basic metabolic panel and CBC were normal. Past Medical History:  Diagnosis Date  . Osteopenia 05/07/2013   Past Surgical History:  Procedure Laterality Date  . BREAST SURGERY    . WISDOM TOOTH EXTRACTION     Current Outpatient Medications on File Prior to Visit  Medication Sig Dispense Refill  . Melatonin 10 MG TABS Take 10 mg by mouth at bedtime as needed (for sleep).    . Multiple Vitamin (MULTIVITAMIN) tablet Take 1 tablet by mouth daily.    . naproxen sodium (ANAPROX) 220 MG tablet Take 660 mg by mouth 2 (two) times daily as needed (for pain).      No current facility-administered medications on file prior to visit.   Allergies  Allergen Reactions  . Penicillins Other (See Comments)    "doesn't work well with me" per patient.   Social History   Socioeconomic History  . Marital status: Single    Spouse name: Not on file  . Number of children: Not on file  . Years of education: Not on file  . Highest education level: Not on file  Occupational History  . Not on file  Tobacco Use  . Smoking status: Former Smoker    Quit date: 09/05/2005    Years since quitting: 14.2  . Smokeless tobacco:  Never Used  Substance and Sexual Activity  . Alcohol use: Yes    Alcohol/week: 3.0 standard drinks    Types: 1 Glasses of wine, 2 Cans of beer per week    Comment:  2 beers/ 1 glass of wine on weekends  . Drug use: No  . Sexual activity: Not on file  Other Topics Concern  . 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 Difficulty of Paying Living Expenses:   Food Insecurity:   . Worried About Charity fundraiser in the Last Year:   . Arboriculturist in the Last Year:   Transportation Needs:     . Film/video editor (Medical):   Marland Kitchen Lack of Transportation (Non-Medical):   Physical Activity:   . Days of Exercise per Week:   . Minutes of Exercise per Session:   Stress:   . Feeling of Stress :   Social Connections:   . Frequency of Communication with Friends and Family:   . Frequency of Social Gatherings with Friends and Family:   . Attends Religious Services:   . Active Member of Clubs or Organizations:   . Attends Archivist Meetings:   Marland Kitchen Marital Status:   Intimate Partner Violence:   . Fear of Current or Ex-Partner:   . Emotionally Abused:   Marland Kitchen Physically Abused:   . Sexually Abused:    Family History  Problem Relation Age of Onset  . Stroke Mother   . Cancer Father        hogkins  . Colon cancer Maternal Grandfather 60  . Cancer Maternal Grandfather        colon  . Hyperlipidemia Other   . Cancer Paternal Grandmother        colon  . Heart attack Neg Hx   . Diabetes Neg Hx   . Hypertension Neg Hx   . Sudden death Neg Hx        Review of Systems  All other systems reviewed and are negative.      Objective:   Physical Exam Vitals reviewed.  Constitutional:      General: She is not in acute distress.    Appearance: Normal appearance. She is normal weight. She is not ill-appearing, toxic-appearing or diaphoretic.  HENT:     Right Ear: Tympanic membrane, ear canal and external ear normal. There is no impacted cerumen.     Left Ear: Tympanic membrane, ear canal and external ear normal. There is no impacted cerumen.     Nose: Nose normal. No congestion or rhinorrhea.     Mouth/Throat:     Mouth: Mucous membranes are moist.     Pharynx: Oropharynx is clear. No oropharyngeal exudate or posterior oropharyngeal erythema.  Eyes:     General:        Right eye: No discharge.        Left eye: No discharge.     Extraocular Movements: Extraocular movements intact.     Conjunctiva/sclera: Conjunctivae normal.     Pupils: Pupils are equal, round, and  reactive to light.  Neck:     Vascular: No carotid bruit.  Cardiovascular:     Rate and Rhythm: Normal rate and regular rhythm.  Pulses: Normal pulses.     Heart sounds: Normal heart sounds. No murmur. No friction rub. No gallop.   Pulmonary:     Effort: Pulmonary effort is normal. No respiratory distress.     Breath sounds: Normal breath sounds. No stridor. No wheezing, rhonchi or rales.  Chest:     Chest wall: No tenderness.  Abdominal:     General: Abdomen is flat. Bowel sounds are normal. There is no distension.     Palpations: Abdomen is soft.     Tenderness: There is no abdominal tenderness. There is no guarding.     Hernia: No hernia is present.  Musculoskeletal:     Cervical back: Normal range of motion and neck supple. No rigidity.     Right lower leg: No edema.     Left lower leg: No edema.  Lymphadenopathy:     Cervical: No cervical adenopathy.  Neurological:     General: No focal deficit present.     Mental Status: She is alert and oriented to person, place, and time. Mental status is at baseline.     Cranial Nerves: No cranial nerve deficit.     Sensory: No sensory deficit.     Motor: No weakness.     Coordination: Coordination normal.     Gait: Gait normal.     Deep Tendon Reflexes: Reflexes normal.  Psychiatric:        Mood and Affect: Mood normal.        Behavior: Behavior normal.        Thought Content: Thought content normal.        Judgment: Judgment normal.           Assessment & Plan:  Encounter for screening mammogram for malignant neoplasm of breast - Plan: MM Digital Screening  Postmenopausal estrogen deficiency - Plan: DG Bone Density  Vasovagal syncope - Plan: ECHOCARDIOGRAM COMPLETE  Screening cholesterol level - Plan: COMPLETE METABOLIC PANEL WITH GFR, Lipid panel  Encounter to establish care with new doctor  Patient's physical exam is completely normal today.  Her history suggest vasovagal syncope.  However given the fact this is  occurred twice within the last month, I would like to get an echocardiogram and heart to rule out structural heart disease or valvular insufficiency.  However there is no murmur today on exam and I suspect that her echocardiogram will be normal.  She is overdue for mammogram so we will schedule this.  She is also due for a bone density test and I will schedule this as well.  While checking lab work today I will screen the patient for hyperlipidemia with a fasting lipid panel.  Colonoscopy is good until 2024.  She politely declines HIV and hepatitis C test today.  I recommended she return in 1 month after she receives the Covid vaccination and received Prevnar 13.  She can receive Pneumovax 23 1 year later.  Her Pap smear is due however she can schedule this at her earliest convenience.

## 2019-12-03 LAB — COMPLETE METABOLIC PANEL WITH GFR
AG Ratio: 1.5 (calc) (ref 1.0–2.5)
ALT: 9 U/L (ref 6–29)
AST: 16 U/L (ref 10–35)
Albumin: 4.1 g/dL (ref 3.6–5.1)
Alkaline phosphatase (APISO): 39 U/L (ref 37–153)
BUN: 16 mg/dL (ref 7–25)
CO2: 25 mmol/L (ref 20–32)
Calcium: 9.4 mg/dL (ref 8.6–10.4)
Chloride: 105 mmol/L (ref 98–110)
Creat: 0.8 mg/dL (ref 0.50–0.99)
GFR, Est African American: 90 mL/min/{1.73_m2} (ref >=60)
GFR, Est Non African American: 77 mL/min/{1.73_m2} (ref >=60)
Globulin: 2.7 g/dL (calc) (ref 1.9–3.7)
Glucose, Bld: 100 mg/dL — ABNORMAL HIGH (ref 65–99)
Potassium: 4.2 mmol/L (ref 3.5–5.3)
Sodium: 139 mmol/L (ref 135–146)
Total Bilirubin: 0.6 mg/dL (ref 0.2–1.2)
Total Protein: 6.8 g/dL (ref 6.1–8.1)

## 2019-12-03 LAB — LIPID PANEL
Cholesterol: 179 mg/dL (ref <200)
HDL: 70 mg/dL (ref >=50)
LDL Cholesterol (Calc): 92 mg/dL (calc)
Non-HDL Cholesterol (Calc): 109 mg/dL (calc) (ref <130)
Total CHOL/HDL Ratio: 2.6 (calc) (ref <5.0)
Triglycerides: 81 mg/dL (ref <150)

## 2019-12-27 ENCOUNTER — Ambulatory Visit (HOSPITAL_COMMUNITY): Payer: 59 | Attending: Family Medicine

## 2019-12-27 ENCOUNTER — Other Ambulatory Visit: Payer: Self-pay

## 2019-12-27 DIAGNOSIS — R55 Syncope and collapse: Secondary | ICD-10-CM | POA: Insufficient documentation

## 2019-12-30 ENCOUNTER — Encounter: Payer: Self-pay | Admitting: Family Medicine

## 2019-12-30 DIAGNOSIS — I341 Nonrheumatic mitral (valve) prolapse: Secondary | ICD-10-CM | POA: Insufficient documentation

## 2020-02-07 ENCOUNTER — Ambulatory Visit: Payer: 59

## 2020-02-07 ENCOUNTER — Other Ambulatory Visit: Payer: 59

## 2020-02-28 ENCOUNTER — Ambulatory Visit (INDEPENDENT_AMBULATORY_CARE_PROVIDER_SITE_OTHER): Payer: 59

## 2020-02-28 ENCOUNTER — Other Ambulatory Visit: Payer: Self-pay

## 2020-02-28 DIAGNOSIS — Z23 Encounter for immunization: Secondary | ICD-10-CM | POA: Diagnosis not present

## 2020-04-14 ENCOUNTER — Other Ambulatory Visit: Payer: Self-pay

## 2020-04-14 ENCOUNTER — Ambulatory Visit
Admission: RE | Admit: 2020-04-14 | Discharge: 2020-04-14 | Disposition: A | Discharge status: Home / Self Care | Payer: 59 | Source: Ambulatory Visit | Attending: Family Medicine | Admitting: Family Medicine

## 2020-04-14 DIAGNOSIS — Z780 Asymptomatic menopausal state: Secondary | ICD-10-CM

## 2020-04-14 DIAGNOSIS — Z1231 Encounter for screening mammogram for malignant neoplasm of breast: Secondary | ICD-10-CM

## 2020-04-16 ENCOUNTER — Other Ambulatory Visit: Payer: Self-pay

## 2020-04-16 ENCOUNTER — Encounter: Payer: Self-pay | Admitting: Family Medicine

## 2020-04-16 DIAGNOSIS — M810 Age-related osteoporosis without current pathological fracture: Secondary | ICD-10-CM | POA: Insufficient documentation

## 2020-04-16 MED ORDER — ALENDRONATE SODIUM 70 MG PO TABS: 70.0000 mg | ORAL_TABLET | ORAL | 11 refills | Status: DC

## 2020-06-03 ENCOUNTER — Other Ambulatory Visit: Payer: Self-pay | Admitting: Family Medicine

## 2020-06-19 ENCOUNTER — Ambulatory Visit (INDEPENDENT_AMBULATORY_CARE_PROVIDER_SITE_OTHER): Payer: 59 | Admitting: Family Medicine

## 2020-06-19 ENCOUNTER — Other Ambulatory Visit: Payer: Self-pay

## 2020-06-19 VITALS — BP 130/60 | HR 69 | Temp 97.8°F | Ht 67.0 in | Wt 150.0 lb

## 2020-06-19 DIAGNOSIS — Z124 Encounter for screening for malignant neoplasm of cervix: Secondary | ICD-10-CM | POA: Diagnosis not present

## 2020-06-19 DIAGNOSIS — Z0000 Encounter for general adult medical examination without abnormal findings: Secondary | ICD-10-CM | POA: Diagnosis not present

## 2020-06-19 NOTE — Patient Instructions (Signed)
° ° ° °  If you have lab work done today you will be contacted with your lab results within the next 2 weeks.  If you have not heard from us then please contact us. The fastest way to get your results is to register for My Chart. ° ° °IF you received an x-ray today, you will receive an invoice from [facility]. Please contact [facility] at [phone] with questions or concerns regarding your invoice.  ° °IF you received labwork today, you will receive an invoice from [facility]. Please contact [facility] at [phone] with questions or concerns regarding your invoice.  ° °Our billing staff will not be able to assist you with questions regarding bills from these companies. ° °You will be contacted with the lab results as soon as they are available. The fastest way to get your results is to activate your My Chart account. Instructions are located on the last page of this paperwork. If you have not heard from us regarding the results in 2 weeks, please contact this office. °  ° ° ° °

## 2020-06-19 NOTE — Progress Notes (Signed)
Subjective:    Patient ID: Tammy Mills, female    DOB: 10/07/1955, 64 y.o.   MRN: [id number]  HPI  Patient is a very pleasant 64 year old Caucasian female here today for complete physical exam.  Her last Pap smear was 11 years ago.  She would like to get one more before discontinuing cervical cancer screening.  She had a mammogram earlier this year that was completely normal.  Her colonoscopy is up-to-date.  Last colonoscopy was 2014 and is not due again until 2024.  She had a bone density test earlier this year that showed a T score of -2.6 in the right hip.  She started Fosamax along with calcium and vitamin D for osteoporosis.  She has had Prevnar 13.  She has had her flu shot.  She has had the [employer] Covid vaccine.  She is scheduling herself for a booster next month.  She is due for a shingles shot.  She is also due for Pneumovax 23 next year. Past Medical History:  Diagnosis Date  . Mitral valve prolapse    mild to moderate regurgitation  . Osteoporosis    Past Surgical History:  Procedure Laterality Date  . BREAST EXCISIONAL BIOPSY Right    20 + yrs ago, benign  . BREAST SURGERY    . WISDOM TOOTH EXTRACTION     Current Outpatient Medications on File Prior to Visit  Medication Sig Dispense Refill  . alendronate (FOSAMAX) 70 MG tablet TAKE 1 TAB BY MOUTH EVERY 7 (SEVEN) DAYS WITH A FULL GLASS OF WATER ON AN EMPTY STOMACH. 12 tablet 4  . Melatonin 10 MG TABS Take 10 mg by mouth at bedtime as needed (for sleep).    . Multiple Vitamin (MULTIVITAMIN) tablet Take 1 tablet by mouth daily.    . naproxen sodium (ANAPROX) 220 MG tablet Take 660 mg by mouth 2 (two) times daily as needed (for pain).      No current facility-administered medications on file prior to visit.   Allergies  Allergen Reactions  . Penicillins Other (See Comments)    "doesn't work well with me" per patient.   Social History   Socioeconomic History  . Marital status: Single    Spouse name: Not on file  . Number  of children: Not on file  . Years of education: Not on file  . Highest education level: Not on file  Occupational History  . Not on file  Tobacco Use  . Smoking status: Former Smoker    Quit date: 09/05/2005    Years since quitting: 14.7  . Smokeless tobacco: Never Used  Substance and Sexual Activity  . Alcohol use: Yes    Alcohol/week: 3.0 standard drinks    Types: 1 Glasses of wine, 2 Cans of beer per week    Comment:  2 beers/ 1 glass of wine on weekends  . Drug use: No  . Sexual activity: Not on file  Other Topics Concern  . 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 Difficulty of Paying Living Expenses: Not on file  Food Insecurity:   . Worried About Charity fundraiser in the Last Year: Not on file  . Ran Out of Food in the Last Year:  Not on file  Transportation Needs:   . Lack of Transportation (Medical): Not on file  . Lack of Transportation (Non-Medical): Not on file  Physical Activity:   . Days of Exercise per Week: Not on file  . Minutes of Exercise per Session: Not on file  Stress:   . Feeling of Stress : Not on file  Social Connections:   . Frequency of Communication with Friends and Family: Not on file  . Frequency of Social Gatherings with Friends and Family: Not on file  . Attends Religious Services: Not on file  . Active Member of Clubs or Organizations: Not on file  . Attends Archivist Meetings: Not on file  . Marital Status: Not on file  Intimate Partner Violence:   . Fear of Current or Ex-Partner: Not on file  . Emotionally Abused: Not on file  . Physically Abused: Not on file  . Sexually Abused: Not on file   Family History  Problem Relation Age of Onset  . Stroke Mother   . Cancer Father        hogkins   . Colon cancer Maternal Grandfather 62  . Cancer Maternal Grandfather        colon  . Hyperlipidemia Other   . Cancer Paternal Grandmother        colon  . Heart attack Neg Hx   . Diabetes Neg Hx   . Hypertension Neg Hx   . Sudden death Neg Hx        Review of Systems  All other systems reviewed and are negative.      Objective:   Physical Exam Vitals reviewed. Exam conducted with a chaperone present.  Constitutional:      General: She is not in acute distress.    Appearance: Normal appearance. She is normal weight. She is not ill-appearing, toxic-appearing or diaphoretic.  HENT:     Right Ear: Tympanic membrane, ear canal and external ear normal. There is no impacted cerumen.     Left Ear: Tympanic membrane, ear canal and external ear normal. There is no impacted cerumen.     Nose: Nose normal. No congestion or rhinorrhea.     Mouth/Throat:     Mouth: Mucous membranes are moist.     Pharynx: Oropharynx is clear. No oropharyngeal exudate or posterior oropharyngeal erythema.  Eyes:     General:        Right eye: No discharge.        Left eye: No discharge.     Extraocular Movements: Extraocular movements intact.     Conjunctiva/sclera: Conjunctivae normal.     Pupils: Pupils are equal, round, and reactive to light.  Neck:     Vascular: No carotid bruit.  Cardiovascular:     Rate and Rhythm: Normal rate and regular rhythm.     Pulses: Normal pulses.     Heart sounds: Normal heart sounds. No murmur heard.  No friction rub. No gallop.   Pulmonary:     Effort: Pulmonary effort is normal. No respiratory distress.     Breath sounds: Normal breath sounds. No stridor. No wheezing, rhonchi or rales.  Chest:     Chest wall: No tenderness.  Abdominal:     General: Abdomen is flat. Bowel sounds are normal. There is no distension.     Palpations: Abdomen is soft.     Tenderness: There is no abdominal tenderness. There is no guarding.     Hernia: No hernia is present.   Genitourinary:  Labia:        Right: No rash, tenderness, lesion or injury.        Left: No rash, tenderness, lesion or injury.      Cervix: No friability, lesion or erythema.    Musculoskeletal:     Cervical back: Normal range of motion and neck supple. No rigidity.     Right lower leg: No edema.     Left lower leg: No edema.  Lymphadenopathy:     Cervical: No cervical adenopathy.  Neurological:     General: No focal deficit present.     Mental Status: She is alert and oriented to person, place, and time. Mental status is at baseline.     Cranial Nerves: No cranial nerve deficit.     Sensory: No sensory deficit.     Motor: No weakness.     Coordination: Coordination normal.     Gait: Gait normal.     Deep Tendon Reflexes: Reflexes normal.  Psychiatric:        Mood and Affect: Mood normal.        Behavior: Behavior normal.        Thought Content: Thought content normal.        Judgment: Judgment normal.           Assessment & Plan:  Cervical cancer screening - Plan: PAP, Thin Prep w/HPV rflx HPV Type 16/18  Patient's physical exam is completely normal today.  We reviewed her lab work from earlier this year including a CBC, CMP, fasting lipid panel all of which was normal.  Mammogram is up-to-date.  Pap smear was sent to pathology in a labeled container.  Colonoscopy is due again in 2024.  Recommended Pneumovax 23 next spring.  Recommended a booster on her Covid shot at her earliest convenience.  Recommended shingles vaccine when available.

## 2020-06-23 LAB — PAP, TP IMAGING W/ HPV RNA, RFLX HPV TYPE 16,18/45: HPV DNA High Risk: NOT DETECTED

## 2020-07-24 ENCOUNTER — Ambulatory Visit: Payer: 59 | Attending: Internal Medicine

## 2020-07-24 ENCOUNTER — Other Ambulatory Visit (HOSPITAL_BASED_OUTPATIENT_CLINIC_OR_DEPARTMENT_OTHER): Payer: Self-pay | Admitting: Internal Medicine

## 2020-07-24 DIAGNOSIS — Z23 Encounter for immunization: Secondary | ICD-10-CM

## 2020-07-24 NOTE — Progress Notes (Signed)
   Covid-19 Vaccination Clinic  Name:  Tammy Mills    MRN: [id number] DOB: [birth year]  07/24/2020  Tammy Mills was observed post Covid-19 immunization for 15 minutes without incident. She was provided with Vaccine Information Sheet and instruction to access the V-Safe system.   Tammy Mills was instructed to call 911 with any severe reactions post vaccine: Marland Kitchen Difficulty breathing  . Swelling of face and throat  . A fast heartbeat  . A bad rash all over body  . Dizziness and weakness   Immunizations Administered    Name Date Dose VIS Date Route   Pfizer COVID-19 Vaccine 07/24/2020  2:27 PM 0.3 mL 06/24/2020 Intramuscular   Manufacturer: [place]   Lot: [id number]   [place]: [address]-1

## 2020-09-02 ENCOUNTER — Encounter: Payer: Self-pay | Admitting: Family Medicine

## 2020-12-09 ENCOUNTER — Encounter: Payer: Self-pay | Admitting: Family Medicine

## 2021-01-06 ENCOUNTER — Encounter: Payer: Self-pay | Admitting: *Deleted

## 2021-01-13 ENCOUNTER — Other Ambulatory Visit: Payer: Self-pay

## 2021-01-13 ENCOUNTER — Ambulatory Visit (INDEPENDENT_AMBULATORY_CARE_PROVIDER_SITE_OTHER): Payer: Medicare Other | Admitting: *Deleted

## 2021-01-13 DIAGNOSIS — M810 Age-related osteoporosis without current pathological fracture: Secondary | ICD-10-CM | POA: Diagnosis not present

## 2021-01-13 MED ORDER — DENOSUMAB 60 MG/ML ~~LOC~~ SOSY
60.0000 mg | PREFILLED_SYRINGE | SUBCUTANEOUS | Status: DC
  Administered 2021-01-13 – 2021-07-20 (×2): 60 mg via SUBCUTANEOUS

## 2021-01-13 NOTE — Patient Instructions (Signed)
Denosumab injection (Prolia) is used: -to treat osteoporosis (a condition in which the bones become thin and weak and break easily) in women who have undergone menopause (''change of life;'' end of menstrual periods) who have an increased risk for fractures (broken bones) or who cannot take or did not respond to other medication treatments for osteoporosis. -to treat men who have an increased risk for fractures (broken bones) or who cannot take or did not respond to other medication treatments for osteoporosis. -treat osteoporosis that is caused by corticosteroid medications in men and women who will be taking corticosteroid medications for at least 6 months and have an increased risk for fractures or who cannot take or did not respond to other medication treatments for osteoporosis. to treat bone loss in men who are being treated for prostate cancer with certain medications that cause bone loss, -to treat bone loss in women with breast cancer who are receiving certain medications that increase their risk for fractures.  Denosumab injection may cause side effects. REPORT these symptoms are severe or do not go away: red, dry, or itchy skin oozing or crusty blisters on skin peeling skin back pain pain in your arms swelling of arms or legs muscle or joint pain nausea diarrhea constipation abdominal pain headache   Return in 6 months for next injection.

## 2021-01-18 ENCOUNTER — Ambulatory Visit (INDEPENDENT_AMBULATORY_CARE_PROVIDER_SITE_OTHER): Payer: Medicare Other | Admitting: Physician Assistant

## 2021-01-18 ENCOUNTER — Encounter: Payer: Self-pay | Admitting: Physician Assistant

## 2021-01-18 ENCOUNTER — Ambulatory Visit (INDEPENDENT_AMBULATORY_CARE_PROVIDER_SITE_OTHER): Payer: Medicare Other

## 2021-01-18 DIAGNOSIS — M25561 Pain in right knee: Secondary | ICD-10-CM

## 2021-01-18 DIAGNOSIS — M25551 Pain in right hip: Secondary | ICD-10-CM | POA: Diagnosis not present

## 2021-01-18 MED ORDER — METHYLPREDNISOLONE ACETATE 40 MG/ML IJ SUSP
40.0000 mg | INTRAMUSCULAR | Status: AC | PRN
  Administered 2021-01-18: 40 mg via INTRA_ARTICULAR

## 2021-01-18 MED ORDER — LIDOCAINE HCL 1 % IJ SOLN
3.0000 mL | INTRAMUSCULAR | Status: AC | PRN
  Administered 2021-01-18: 3 mL

## 2021-01-18 MED ORDER — MELOXICAM 7.5 MG PO TABS: 7.5000 mg | ORAL_TABLET | Freq: Two times a day (BID) | ORAL | 1 refills | Status: DC

## 2021-01-18 NOTE — Progress Notes (Signed)
Office Visit Note   Patient: Tammy Mills           Date of Birth: March 04, 1956           MRN: [id number] Visit Date: 01/18/2021              Requested by: Susy Frizzle, MD [address] [place],  [place] [address] PCP: Susy Frizzle, MD   Assessment & Plan: Visit Diagnoses:  1. Pain in right hip   2. Right knee pain, unspecified chronicity     Plan: Discussed quad strengthening exercises and knee friendly exercises.  Would like for her to stop having duction exercises of the right hip.  Questions were encouraged and answered at length.  She will follow-up as needed.  Did place her on Mobic she will stop the naproxen.    Follow-Up Instructions: Return if symptoms worsen or fail to improve.   Orders:  Orders Placed This Encounter  Procedures  . Large Joint Inj  . Large Joint Inj  . XR HIP UNILAT W OR W/O PELVIS 2-3 VIEWS RIGHT  . XR Knee 1-2 Views Right   Meds ordered this encounter  Medications  . meloxicam (MOBIC) 7.5 MG tablet    Sig: Take 1 tablet (7.5 mg total) by mouth in the morning and at bedtime.    Dispense:  60 tablet    Refill:  1      Procedures: Large Joint Inj: R knee on 01/18/2021 6:31 PM Indications: pain Details: 22 G 1.5 in needle, anterolateral approach  Arthrogram: No  Medications: 3 mL lidocaine 1 %; 40 mg methylPREDNISolone acetate 40 MG/ML Outcome: tolerated well, no immediate complications Procedure, treatment alternatives, risks and benefits explained, specific risks discussed. Consent was given by the patient. Immediately prior to procedure a time out was called to verify the correct patient, procedure, equipment, support staff and site/side marked as required. Patient was prepped and draped in the usual sterile fashion.   Large Joint Inj: R greater trochanter on 01/18/2021 6:31 PM Indications: pain Details: 22 G 1.5 in needle, lateral approach  Arthrogram: No  Medications: 3 mL lidocaine 1 %; 40 mg methylPREDNISolone acetate  40 MG/ML Outcome: tolerated well, no immediate complications Procedure, treatment alternatives, risks and benefits explained, specific risks discussed. Consent was given by the patient. Immediately prior to procedure a time out was called to verify the correct patient, procedure, equipment, support staff and site/side marked as required. Patient was prepped and draped in the usual sterile fashion.       Clinical Data: No additional findings.   Subjective: Chief Complaint  Patient presents with  . Right Hip - Pain  . Right Knee - Pain    HPI Tammy Mills 65 year old female well-known to Dr. Ninfa Linden service comes in today with right hip and right knee pain.  Pains been ongoing for the past month no known injury.  Pain comes and goes.  She does state that she has been doing some exercises that she found on TikTok for tight hips and for knee strengthening.  Last saw our and October 2020 for left hip and left knee pain she states that this pain that she is having right knee and right hip is very similar to what she was having at that time.  She denies any groin pain.  Pains of the lateral aspect of the right hip.  She has difficulty walking at times due to the pain in the knee and hip on the  right side.  She has tried knee braces to help some.  She is also taking naproxen quite a few times during the day. Review of Systems Negative for fevers or chills.  No recent vaccines.  See HPI.  Objective: Vital Signs: There were no vitals taken for this visit.  Physical Exam Pulmonary:     Effort: Pulmonary effort is normal.  Neurological:     Mental Status: She is alert and oriented to person, place, and time.  Psychiatric:        Mood and Affect: Mood normal.     Ortho Exam Bilateral hips excellent range of motion without pain.  Nontender over the left hip trochanteric region.  Tenderness over the right hip trochanteric region. Bilateral knees without obvious valgus deformities.  No gross  instability valgus varus stressing of either knee.  Right knee with tenderness along lateral joint line.  Significant patellofemoral crepitus both knees with passive range of motion.  Good range of motion of both knees calf supple nontender bilaterally. Specialty Comments:  No specialty comments available.  Imaging: XR HIP UNILAT W OR W/O PELVIS 2-3 VIEWS RIGHT  Result Date: 01/18/2021 AP pelvis lateral view right hip: No acute fractures.  Impingement pincer type right hip.  Otherwise hip joints well-maintained.  Both hips are well located.  XR Knee 1-2 Views Right  Result Date: 01/18/2021 AP and lateral view right knee: He is well located.  No acute fracture.  Valgus deformity has progressed since prior films.  Osteochondroma remains unchanged patella notch area.  Moderate patellofemoral arthritic changes.    PMFS History: Patient Active Problem List   Diagnosis Date Noted  . Osteoporosis   . Mitral valve prolapse   . Osteopenia 05/07/2013  . Routine general medical examination at a health care facility 04/30/2013  . Right knee pain 05/08/2012  . Right hip pain 05/08/2012  . Knee pain, bilateral 06/03/2011  . WEIGHT GAIN 06/28/2010   Past Medical History:  Diagnosis Date  . Mitral valve prolapse    mild to moderate regurgitation  . Osteoporosis     Family History  Problem Relation Age of Onset  . Stroke Mother   . Cancer Father        hogkins  . Colon cancer Maternal Grandfather 32  . Cancer Maternal Grandfather        colon  . Hyperlipidemia Other   . Cancer Paternal Grandmother        colon  . Heart attack Neg Hx   . Diabetes Neg Hx   . Hypertension Neg Hx   . Sudden death Neg Hx     Past Surgical History:  Procedure Laterality Date  . BREAST EXCISIONAL BIOPSY Right    20 + yrs ago, benign  . BREAST SURGERY    . WISDOM TOOTH EXTRACTION     Social History   Occupational History  . Not on file  Tobacco Use  . Smoking status: Former Smoker    Quit date:  09/05/2005    Years since quitting: 15.3  . Smokeless tobacco: Never Used  Substance and Sexual Activity  . Alcohol use: Yes    Alcohol/week: 3.0 standard drinks    Types: 1 Glasses of wine, 2 Cans of beer per week    Comment:  2 beers/ 1 glass of wine on weekends  . Drug use: No  . Sexual activity: Not on file

## 2021-03-15 ENCOUNTER — Other Ambulatory Visit: Payer: Self-pay | Admitting: Physician Assistant

## 2021-03-30 ENCOUNTER — Ambulatory Visit: Payer: Medicare Other | Attending: Internal Medicine

## 2021-03-30 DIAGNOSIS — Z23 Encounter for immunization: Secondary | ICD-10-CM

## 2021-03-30 NOTE — Progress Notes (Signed)
   Covid-19 Vaccination Clinic  Name:  Ellajane Stong    MRN: [id number] DOB: 18-Jan-1956  03/30/2021  Ms. Tenpas was observed post Covid-19 immunization for 15 minutes without incident. She was provided with Vaccine Information Sheet and instruction to access the V-Safe system.   Ms. Pawelski was instructed to call 911 with any severe reactions post vaccine: Difficulty breathing  Swelling of face and throat  A fast heartbeat  A bad rash all over body  Dizziness and weakness   Immunizations Administered     Name Date Dose VIS Date Route   PFIZER Comrnaty(Gray TOP) Covid-19 Vaccine 03/30/2021 11:06 AM 0.3 mL 08/13/2020 Intranasal   Manufacturer: [place]   Lot: [id number]   [place]: [phone]

## 2021-04-02 ENCOUNTER — Other Ambulatory Visit (HOSPITAL_BASED_OUTPATIENT_CLINIC_OR_DEPARTMENT_OTHER): Payer: Self-pay

## 2021-04-02 MED ORDER — COVID-19 MRNA VAC-TRIS(PFIZER) 30 MCG/0.3ML IM SUSP
INTRAMUSCULAR | 0 refills | Status: DC
  Filled 2021-04-02: qty 0.3, 1d supply, fill #0

## 2021-04-21 ENCOUNTER — Other Ambulatory Visit: Payer: Self-pay

## 2021-04-21 ENCOUNTER — Ambulatory Visit (INDEPENDENT_AMBULATORY_CARE_PROVIDER_SITE_OTHER): Payer: Medicare Other | Admitting: Nurse Practitioner

## 2021-04-21 ENCOUNTER — Encounter: Payer: Self-pay | Admitting: Nurse Practitioner

## 2021-04-21 VITALS — BP 112/62 | HR 72 | Temp 98.3°F | Resp 16 | Ht 67.0 in | Wt 151.0 lb

## 2021-04-21 DIAGNOSIS — R21 Rash and other nonspecific skin eruption: Secondary | ICD-10-CM | POA: Diagnosis not present

## 2021-04-21 NOTE — Progress Notes (Signed)
Subjective:    Patient ID: Tammy Mills, female    DOB: [birth year], 65 y.o.   MRN: [id number]  HPI: Tammy Mills is a 65 y.o. female presenting for skin infection.  Chief Complaint  Patient presents with   Cellulitis    X3 days- possible insect bites to R lower ABD- large red area surrounding bites    SKIN INFECTION Duration: days Location: right lower abdomen History of trauma in area: no Pain: no Quality: not painful Severity: not painful  Redness: yes Swelling: no Oozing: no Itching: yes Pus: no Fevers: no Nausea/vomiting: no Shortness of breath: no Chest pain: no Status: worse Treatments attempted: hydrocortisone cream  , Benadryl  Allergies  Allergen Reactions   Alendronate Sodium Other (See Comments)   Fosamax [Alendronate]    Penicillins Other (See Comments)    "doesn't work well with me" per patient.    Outpatient Encounter Medications as of 04/21/2021  Medication Sig   calcium citrate-vitamin D (CITRACAL+D) 315-200 MG-UNIT tablet Take 1 tablet by mouth 2 (two) times daily.   Melatonin 10 MG TABS Take 10 mg by mouth at bedtime as needed (for sleep).   meloxicam (MOBIC) 7.5 MG tablet TAKE 1 TABLET(7.5 MG) BY MOUTH IN THE MORNING AND AT BEDTIME   Multiple Vitamin (MULTIVITAMIN) tablet Take 1 tablet by mouth daily.   naproxen sodium (ANAPROX) 220 MG tablet Take 660 mg by mouth 2 (two) times daily as needed (for pain).    [DISCONTINUED] COVID-19 mRNA Vac-TriS, Pfizer, SUSP injection Inject into the muscle.   [DISCONTINUED] COVID-19 mRNA vaccine, Pfizer, 30 MCG/0.3ML injection AS DIRECTED   Facility-Administered Encounter Medications as of 04/21/2021  Medication   denosumab (PROLIA) injection 60 mg    Patient Active Problem List   Diagnosis Date Noted   Osteoporosis    Mitral valve prolapse    Osteopenia 05/07/2013   Routine general medical examination at a health care facility 04/30/2013   Right knee pain 05/08/2012   Right hip pain 05/08/2012   Knee  pain, bilateral 06/03/2011   WEIGHT GAIN 06/28/2010    Past Medical History:  Diagnosis Date   Mitral valve prolapse    mild to moderate regurgitation   Osteoporosis     Relevant past medical, surgical, family and social history reviewed and updated as indicated. Interim medical history since our last visit reviewed.  Review of Systems Per HPI unless specifically indicated above     Objective:    BP 112/62   Pulse 72   Temp 98.3 F (36.8 C) (Temporal)   Resp 16   Ht 5' 7" (1.702 m)   Wt 151 lb (68.5 kg)   SpO2 99%   BMI 23.65 kg/m   Wt Readings from Last 3 Encounters:  04/21/21 151 lb (68.5 kg)  06/19/20 150 lb (68 kg)  12/02/19 156 lb (70.8 kg)    Physical Exam Vitals and nursing note reviewed.  Constitutional:      General: She is not in acute distress.    Appearance: Normal appearance. She is not toxic-appearing.  Skin:    General: Skin is warm and dry.     Capillary Refill: Capillary refill takes less than 2 seconds.     Coloration: Skin is not jaundiced or pale.     Findings: Erythema and rash present. Rash is papular and urticarial.          Comments: Rough, erythematous rash noted to right lower abdomen and groin.  Areas with pinpoint papule in middle.  Nonfluctuant, no drainage, warmth, tenderness.   Neurological:     Mental Status: She is alert and oriented to person, place, and time.     Motor: No weakness.     Gait: Gait normal.  Psychiatric:        Mood and Affect: Mood normal.        Behavior: Behavior normal.        Thought Content: Thought content normal.        Judgment: Judgment normal.      Assessment & Plan:  1. Rash and nonspecific skin eruption Acute.  I suspect insect bite with localized skin irritation. Discussed use of antihistamine to help with itch and OTC hydrocortisone cream with emollient to allow skin to heal.  No systemic s/s cellulitis today.  Follow up if symptoms worsen.    Follow up plan: Return if symptoms worsen or  fail to improve. 

## 2021-05-11 ENCOUNTER — Other Ambulatory Visit: Payer: Self-pay | Admitting: Physician Assistant

## 2021-05-17 ENCOUNTER — Other Ambulatory Visit (HOSPITAL_BASED_OUTPATIENT_CLINIC_OR_DEPARTMENT_OTHER): Payer: Self-pay

## 2021-06-03 ENCOUNTER — Ambulatory Visit (INDEPENDENT_AMBULATORY_CARE_PROVIDER_SITE_OTHER): Payer: Medicare Other | Admitting: Family Medicine

## 2021-06-03 ENCOUNTER — Other Ambulatory Visit: Payer: Self-pay

## 2021-06-03 VITALS — BP 140/74 | HR 78 | Temp 96.8°F | Ht 67.0 in | Wt 153.0 lb

## 2021-06-03 DIAGNOSIS — M26609 Unspecified temporomandibular joint disorder, unspecified side: Secondary | ICD-10-CM | POA: Diagnosis not present

## 2021-06-03 MED ORDER — DIAZEPAM 5 MG PO TABS: ORAL_TABLET | ORAL | 1 refills | Status: DC

## 2021-06-03 MED ORDER — PREDNISONE 20 MG PO TABS: ORAL_TABLET | ORAL | 0 refills | Status: DC

## 2021-06-03 NOTE — Progress Notes (Signed)
Subjective:    Patient ID: Tammy Mills, female    DOB: 1955-11-15, 65 y.o.   MRN: [id number]  HPI Patient is a very pleasant 65 year old Caucasian female who presents today with left TMJ pain.  She states that she has had pain in the joint for years.  Over a year ago, her dentist made her a bite guard due to bruxism at night.  However the pain has worsened.  She now reports crepitus and pain and popping every time she chews.  She has a hard time opening and closing her jaw time due to the pain.  She denies any otalgia.  She denies any hearing loss or tinnitus or vertigo.  She denies any sore throat or dysphagia.  There is no palpable lymphadenopathy in the head or neck.  There is no visible deformity in the left auditory canal or in the posterior oropharynx but there is palpable crepitus with range of motion and pain elicited directly over the TMJ joint. Past Medical History:  Diagnosis Date   Mitral valve prolapse    mild to moderate regurgitation   Osteoporosis    .psh Current Outpatient Medications on File Prior to Visit  Medication Sig Dispense Refill   calcium citrate-vitamin D (CITRACAL+D) 315-200 MG-UNIT tablet Take 1 tablet by mouth 2 (two) times daily.     Melatonin 10 MG TABS Take 10 mg by mouth at bedtime as needed (for sleep).     meloxicam (MOBIC) 7.5 MG tablet TAKE 1 TABLET(7.5 MG) BY MOUTH IN THE MORNING AND AT BEDTIME 60 tablet 1   Multiple Vitamin (MULTIVITAMIN) tablet Take 1 tablet by mouth daily.     Current Facility-Administered Medications on File Prior to Visit  Medication Dose Route Frequency Provider Last Rate Last Admin   denosumab (PROLIA) injection 60 mg  60 mg Subcutaneous Q6 months Susy Frizzle, MD   60 mg at 01/13/21 1250   Allergies  Allergen Reactions   Alendronate Sodium Other (See Comments)   Fosamax [Alendronate]    Penicillins Other (See Comments)    "doesn't work well with me" per patient.   Social History   Socioeconomic History   Marital  status: Single    Spouse name: Not on file   Number of children: Not on file   Years of education: Not on file   Highest education level: Not on file  Occupational History   Not on file  Tobacco Use   Smoking status: Former    Types: Cigarettes    Quit date: 09/05/2005    Years since quitting: 15.7   Smokeless tobacco: Never  Substance and Sexual Activity   Alcohol use: Yes    Alcohol/week: 3.0 standard drinks    Types: 1 Glasses of wine, 2 Cans of beer per week    Comment:  2 beers/ 1 glass of wine on weekends   Drug use: No   Sexual activity: Not on file  Other Topics Concern   Not on file  Social History Narrative   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  Constitutional:      Appearance: Normal appearance. She is normal weight.  HENT:     Right Ear: Tympanic membrane and ear canal normal.     Left Ear: Tympanic membrane, ear canal and external ear normal.     Nose: Nose normal.     Mouth/Throat:     Mouth: Mucous membranes are moist.     Pharynx: Oropharynx is clear. No oropharyngeal exudate or posterior oropharyngeal erythema.  Cardiovascular:     Pulses: Normal pulses.     Heart sounds: Normal heart sounds.  Pulmonary:     Effort: Pulmonary effort is normal.     Breath sounds: Normal breath sounds.  Musculoskeletal:     Cervical back: Normal range of motion and neck supple.  Lymphadenopathy:     Cervical: No cervical adenopathy.  Neurological:     Mental Status: She is alert.           Assessment & Plan:   TMJ (temporomandibular joint syndrome) Begin Valium 5 to 10 mg p.o. nightly as a muscle relaxer every night for the next 2 to 3 weeks.  She is already been on meloxicam without any success and using a bite guard.  Therefore I will step her up to prednisone for a week and continue the bite guard.  Then step back down to the meloxicam 15 mg daily and reassess in 2 to 3 weeks.  If no improvement is seen, may require referral to oral maxillofacial surgery

## 2021-06-30 ENCOUNTER — Encounter: Payer: Self-pay | Admitting: *Deleted

## 2021-07-08 ENCOUNTER — Other Ambulatory Visit: Payer: Self-pay | Admitting: Physician Assistant

## 2021-07-09 ENCOUNTER — Other Ambulatory Visit (HOSPITAL_BASED_OUTPATIENT_CLINIC_OR_DEPARTMENT_OTHER): Payer: Self-pay

## 2021-07-09 ENCOUNTER — Ambulatory Visit: Payer: Medicare Other | Attending: Internal Medicine

## 2021-07-09 DIAGNOSIS — Z23 Encounter for immunization: Secondary | ICD-10-CM

## 2021-07-09 MED ORDER — INFLUENZA VAC A&B SA ADJ QUAD 0.5 ML IM PRSY
PREFILLED_SYRINGE | INTRAMUSCULAR | 0 refills | Status: DC
  Filled 2021-07-09: qty 0.5, 1d supply, fill #0

## 2021-07-09 NOTE — Progress Notes (Signed)
   Covid-19 Vaccination Clinic  Name:  Tammy Mills    MRN: [id number] DOB: Sep 11, 1955  07/09/2021  [name] was observed post Covid-19 immunization for 15 minutes without incident. She was provided with Vaccine Information Sheet and instruction to access the V-Safe system.   [name] was instructed to call 911 with any severe reactions post vaccine: Difficulty breathing  Swelling of face and throat  A fast heartbeat  A bad rash all over body  Dizziness and weakness   Immunizations Administered     Name Date Dose VIS Date Route   Pfizer Covid-19 Vaccine Bivalent Booster 07/09/2021 12:53 PM 0.3 mL 05/05/2021 Intramuscular   Manufacturer: [place]   Lot: [id number]   [place]: [phone]

## 2021-07-20 ENCOUNTER — Ambulatory Visit: Payer: Medicare Other

## 2021-07-20 ENCOUNTER — Ambulatory Visit (INDEPENDENT_AMBULATORY_CARE_PROVIDER_SITE_OTHER): Payer: Medicare Other | Admitting: *Deleted

## 2021-07-20 ENCOUNTER — Other Ambulatory Visit: Payer: Self-pay

## 2021-07-20 DIAGNOSIS — M810 Age-related osteoporosis without current pathological fracture: Secondary | ICD-10-CM | POA: Diagnosis not present

## 2021-07-27 ENCOUNTER — Other Ambulatory Visit (HOSPITAL_BASED_OUTPATIENT_CLINIC_OR_DEPARTMENT_OTHER): Payer: Self-pay

## 2021-07-27 MED ORDER — PFIZER COVID-19 VAC BIVALENT 30 MCG/0.3ML IM SUSP
INTRAMUSCULAR | 0 refills | Status: DC
  Filled 2021-07-27: qty 0.3, 1d supply, fill #0

## 2021-08-09 ENCOUNTER — Other Ambulatory Visit: Payer: Self-pay | Admitting: Orthopaedic Surgery

## 2021-08-12 ENCOUNTER — Encounter: Payer: Self-pay | Admitting: Nurse Practitioner

## 2021-08-12 ENCOUNTER — Other Ambulatory Visit: Payer: Self-pay

## 2021-08-12 ENCOUNTER — Ambulatory Visit (INDEPENDENT_AMBULATORY_CARE_PROVIDER_SITE_OTHER): Payer: Medicare Other | Admitting: Nurse Practitioner

## 2021-08-12 VITALS — BP 132/60 | HR 70 | Ht 67.0 in | Wt 158.0 lb

## 2021-08-12 DIAGNOSIS — M79672 Pain in left foot: Secondary | ICD-10-CM | POA: Diagnosis not present

## 2021-08-12 NOTE — Progress Notes (Signed)
Subjective:    Patient ID: Tammy Mills, female    DOB: [birth year], 65 y.o.   MRN: [id number]  HPI: Tammy Mills is a 65 y.o. female presenting for left foot pain.  Chief Complaint  Patient presents with   Follow-up    L foot swelling painful x2 days no injury    FOOT PAIN Started wearing inserts a couple of days ago.  Pain started after this.  Duration: days Involved foot: left Mechanism of injury: unknown Location: bottom of left foot Onset: worse in the morning; sudden when she steps out of bed Severity: worse in the morning  Quality: aching Frequency: constant, worse/better at times Radiation: no Aggravating factors: morning time, tighter shoes with laces, inserts Alleviating factors: wearing looser shoes like Crocs, elevation, compression  Status: better Treatments attempted: none  Relief with NSAIDs?:  No NSAIDs Taken Weakness with weight bearing or walking: no Morning stiffness: no Swelling: yes Redness: no Bruising: no Paresthesias / decreased sensation: no  Fevers:no  Allergies  Allergen Reactions   Alendronate Sodium Other (See Comments)   Fosamax [Alendronate]    Penicillins Other (See Comments)    "doesn't work well with me" per patient.    Outpatient Encounter Medications as of 08/12/2021  Medication Sig   calcium citrate-vitamin D (CITRACAL+D) 315-200 MG-UNIT tablet Take 1 tablet by mouth 2 (two) times daily.   COVID-19 mRNA bivalent vaccine, Pfizer, (PFIZER COVID-19 VAC BIVALENT) injection Inject into the muscle.   diazepam (VALIUM) 5 MG tablet 1-2 before bed for muscle spasms   influenza vaccine adjuvanted (FLUAD) 0.5 ML injection Inject into the muscle.   Melatonin 10 MG TABS Take 10 mg by mouth at bedtime as needed (for sleep).   meloxicam (MOBIC) 7.5 MG tablet TAKE 1 TABLET(7.5 MG) BY MOUTH IN THE MORNING AND AT BEDTIME   Multiple Vitamin (MULTIVITAMIN) tablet Take 1 tablet by mouth daily.   predniSONE (DELTASONE) 20 MG tablet 3 tabs poqday 1-2,  2 tabs poqday 3-4, 1 tab poqday 5-6   Facility-Administered Encounter Medications as of 08/12/2021  Medication   denosumab (PROLIA) injection 60 mg    Patient Active Problem List   Diagnosis Date Noted   Osteoporosis    Mitral valve prolapse    Osteopenia 05/07/2013   Routine general medical examination at a health care facility 04/30/2013   Right knee pain 05/08/2012   Right hip pain 05/08/2012   Knee pain, bilateral 06/03/2011   WEIGHT GAIN 06/28/2010    Past Medical History:  Diagnosis Date   Mitral valve prolapse    mild to moderate regurgitation   Osteoporosis     Relevant past medical, surgical, family and social history reviewed and updated as indicated. Interim medical history since our last visit reviewed.  Review of Systems Per HPI unless specifically indicated above     Objective:    BP 132/60   Pulse 70   Ht '5\' 7"'  (1.702 m)   Wt 158 lb (71.7 kg)   SpO2 97%   BMI 24.75 kg/m   Wt Readings from Last 3 Encounters:  08/12/21 158 lb (71.7 kg)  06/03/21 153 lb (69.4 kg)  04/21/21 151 lb (68.5 kg)    Physical Exam Vitals and nursing note reviewed.  Constitutional:      General: She is not in acute distress.    Appearance: Normal appearance. She is not toxic-appearing.  Musculoskeletal:        General: No deformity.     Right lower leg: No edema.  Left lower leg: No edema.       Feet:  Feet:     Right foot:     Skin integrity: Skin integrity normal.     Left foot:     Skin integrity: Skin integrity normal.     Comments: Tenderness to deep palpation in area marked.  No masses or nodules palpated.  Skin:    General: Skin is warm and dry.     Capillary Refill: Capillary refill takes less than 2 seconds.     Coloration: Skin is not jaundiced or pale.     Findings: No erythema.  Neurological:     Mental Status: She is alert and oriented to person, place, and time.     Motor: No weakness.     Gait: Gait normal.      Assessment & Plan:  1.  Acute foot pain, left Acute.  Given history and no recent fall or injury, suspect plantar fasciitis.  Discussed stretches and handout given.  Less likely causes include compression fracture and midfoot, although this seems unlikely.  Encouraged to stop wearing inserts and start stretches. Follow up if symptoms worsen and we can check an x-ray although I do not think one is indicated currently.  With no improvement in symptoms in ~2 weeks, return to clinic.   Follow up plan: Return if symptoms worsen or fail to improve.

## 2021-11-08 ENCOUNTER — Other Ambulatory Visit: Payer: Self-pay

## 2021-11-08 ENCOUNTER — Encounter: Payer: Self-pay | Admitting: Emergency Medicine

## 2021-11-08 ENCOUNTER — Ambulatory Visit
Admission: EM | Admit: 2021-11-08 | Discharge: 2021-11-08 | Disposition: A | Discharge status: Home / Self Care | Payer: Medicare Other | Attending: Emergency Medicine | Admitting: Emergency Medicine

## 2021-11-08 DIAGNOSIS — H6123 Impacted cerumen, bilateral: Secondary | ICD-10-CM | POA: Diagnosis present

## 2021-11-08 DIAGNOSIS — J358 Other chronic diseases of tonsils and adenoids: Secondary | ICD-10-CM | POA: Diagnosis present

## 2021-11-08 LAB — POCT RAPID STREP A (OFFICE): Rapid Strep A Screen: NEGATIVE

## 2021-11-08 NOTE — Discharge Instructions (Addendum)
Based on my physical exam findings and the description you provided today, I believe that you have a tonsil stone that is causing a slight foreign body sensation and looks a little bit like tiny yellow stone in the wall of the back of your throat. ? ?I have been on good authority from the doctor I work with that sucking on a lemon wedge 4 times daily will significantly increase your saliva production and cause the stone to fall out on its own.  If you feel adventurous and want to try to dislodge the stone with either your fingertip or a Q-tip, you are certainly welcome to try.  Antibiotics are not indicated and would be of no benefit.  Gargling with salt water may provide relief of pain if you have any but otherwise the limb and treatment is the way to go. ? ?Please remember to ask your primary care provider about cleaning out your ears, the wax in your ears does not look infected but there is enough of it there that I think you would appreciate having this done. ? ?Your strep test today is negative.  Throat culture will be performed per our protocol.  The result of your throat culture will be posted to your MyChart once it is complete, this typically takes 3 to 5 days.  If there is a positive result, you will be contacted by phone and antibiotics will be prescribed for you. ?  ?Thank you very much for visiting urgent care today.  I appreciate the opportunity to participate in your care. ?

## 2021-11-08 NOTE — ED Provider Notes (Signed)
[facility]    CSN: [id number] Arrival date & time: 11/08/21  1009    HISTORY   Chief Complaint  Patient presents with   Sore Throat   HPI Tammy Mills is a 66 y.o. female. Pt c/o scratchy but not sore throat since Saturday.  Upon inspection this morning, patient states that she noticed pustula area in back of throat on the left.  Patient states her tonsils do not feel swollen, denies difficulty swallowing but states she can feel the area with the back of her tongue sometimes.  Patient denies a history of GERD.  Patient denies a history of allergic rhinitis or seasonal allergies.  She states she has never anything like this before, has not tried anything to relieve her symptoms other than gargling with some quest mouthwash which does not contain alcohol, she adds.  The history is provided by the patient.  Past Medical History:  Diagnosis Date   Mitral valve prolapse    mild to moderate regurgitation   Osteoporosis    Patient Active Problem List   Diagnosis Date Noted   Osteoporosis    Mitral valve prolapse    Osteopenia 05/07/2013   Routine general medical examination at a health care facility 04/30/2013   Right knee pain 05/08/2012   Right hip pain 05/08/2012   Knee pain, bilateral 06/03/2011   WEIGHT GAIN 06/28/2010   Past Surgical History:  Procedure Laterality Date   BREAST EXCISIONAL BIOPSY Right    20 + yrs ago, benign   BREAST SURGERY     WISDOM TOOTH EXTRACTION     OB History   No obstetric history on file.    Home Medications    Prior to Admission medications   Medication Sig Start Date End Date Taking? Authorizing Provider  calcium citrate-vitamin D (CITRACAL+D) 315-200 MG-UNIT tablet Take 1 tablet by mouth 2 (two) times daily.    [provider]  COVID-19 mRNA bivalent vaccine, Pfizer, (PFIZER COVID-19 VAC BIVALENT) injection Inject into the muscle. 07/09/21   Carlyle Basques, MD  diazepam (VALIUM) 5 MG tablet 1-2 before bed for muscle  spasms 06/03/21   Susy Frizzle, MD  influenza vaccine adjuvanted (FLUAD) 0.5 ML injection Inject into the muscle. 07/09/21   Carlyle Basques, MD  Melatonin 10 MG TABS Take 10 mg by mouth at bedtime as needed (for sleep).    [provider]  meloxicam (MOBIC) 7.5 MG tablet TAKE 1 TABLET(7.5 MG) BY MOUTH IN THE MORNING AND AT BEDTIME 08/09/21   Mcarthur Rossetti, MD  Multiple Vitamin (MULTIVITAMIN) tablet Take 1 tablet by mouth daily.    [provider]   Family History Family History  Problem Relation Age of Onset   Stroke Mother    Cancer Father        hogkins   Colon cancer Maternal Grandfather 31   Cancer Maternal Grandfather        colon   Hyperlipidemia Other    Cancer Paternal Grandmother        colon   Heart attack Neg Hx    Diabetes Neg Hx    Hypertension Neg Hx    Sudden death Neg Hx    Social History Social History   Tobacco Use   Smoking status: Former    Types: Cigarettes    Quit date: 09/05/2005    Years since quitting: 16.1   Smokeless tobacco: Never  Substance Use Topics   Alcohol use: Yes    Alcohol/week: 3.0 standard drinks  Types: 1 Glasses of wine, 2 Cans of beer per week    Comment:  2 beers/ 1 glass of wine on weekends   Drug use: No   Allergies   Alendronate sodium, Fosamax [alendronate], and Penicillins  Review of Systems Review of Systems Pertinent findings noted in history of present illness.   Physical Exam Triage Vital Signs ED Triage Vitals  Enc Vitals Group     BP 07/02/21 0827 (!) 147/82     Pulse Rate 07/02/21 0827 72     Resp 07/02/21 0827 18     Temp 07/02/21 0827 98.3 F (36.8 C)     Temp Source 07/02/21 0827 Oral     SpO2 07/02/21 0827 98 %     Weight --      Height --      Head Circumference --      Peak Flow --      Pain Score 07/02/21 0826 5     Pain Loc --      Pain Edu? --      Excl. in [place]? --   No data found.  Updated Vital Signs BP (!) 136/91 (BP Location: Left Arm)    Pulse 76     Temp 98 F (36.7 C) (Oral)    Resp 17    SpO2 98%   Physical Exam Vitals and nursing note reviewed.  Constitutional:      General: She is not in acute distress.    Appearance: Normal appearance. She is not ill-appearing.  HENT:     Head: Normocephalic and atraumatic.     Salivary Glands: Right salivary gland is not diffusely enlarged or tender. Left salivary gland is not diffusely enlarged or tender.     Right Ear: Hearing and external ear normal.     Left Ear: Hearing and external ear normal.     Ears:     Comments: Excessive cerumen present in both ear canals making visualization of TM not possible    Nose: Nose normal. No nasal deformity, septal deviation, mucosal edema, congestion or rhinorrhea.     Right Turbinates: Not enlarged, swollen or pale.     Left Turbinates: Not enlarged, swollen or pale.     Right Sinus: No maxillary sinus tenderness or frontal sinus tenderness.     Left Sinus: No maxillary sinus tenderness or frontal sinus tenderness.     Mouth/Throat:     Lips: Pink. No lesions.     Mouth: Mucous membranes are moist. No oral lesions.     Pharynx: Oropharynx is clear. Uvula midline. No posterior oropharyngeal erythema or uvula swelling.     Tonsils: No tonsillar exudate. 0 on the right. 0 on the left.      Comments: Small punctate in the left posterior aspect of the pharynx concerning for tonsil stone without surrounding erythema or inflammation Eyes:     General: Lids are normal.        Right eye: No discharge.        Left eye: No discharge.     Extraocular Movements: Extraocular movements intact.     Conjunctiva/sclera: Conjunctivae normal.     Right eye: Right conjunctiva is not injected.     Left eye: Left conjunctiva is not injected.  Neck:     Trachea: Trachea and phonation normal.  Cardiovascular:     Rate and Rhythm: Normal rate and regular rhythm.     Pulses: Normal pulses.     Heart sounds: Normal heart sounds. No  murmur heard.   No friction rub. No  gallop.  Pulmonary:     Effort: Pulmonary effort is normal. No accessory muscle usage, prolonged expiration or respiratory distress.     Breath sounds: Normal breath sounds. No stridor, decreased air movement or transmitted upper airway sounds. No decreased breath sounds, wheezing, rhonchi or rales.  Chest:     Chest wall: No tenderness.  Musculoskeletal:        General: Normal range of motion.     Cervical back: Normal range of motion and neck supple. Normal range of motion.  Lymphadenopathy:     Cervical: No cervical adenopathy.  Skin:    General: Skin is warm and dry.     Findings: No erythema or rash.  Neurological:     General: No focal deficit present.     Mental Status: She is alert and oriented to person, place, and time.  Psychiatric:        Mood and Affect: Mood normal.        Behavior: Behavior normal.    Visual Acuity Right Eye Distance:   Left Eye Distance:   Bilateral Distance:    Right Eye Near:   Left Eye Near:    Bilateral Near:     [facility] Couse / Diagnostics / Procedures:    EKG  Radiology No results found.  Procedures Procedures (including critical care time)  UC Diagnoses / Final Clinical Impressions(s)   I have reviewed the triage vital signs and the nursing notes.  Pertinent labs & imaging results that were available during my care of the patient were reviewed by me and considered in my medical decision making (see chart for details).   Final diagnoses:  Tonsillith  Tonsillar exudate  Excessive cerumen in both ear canals   Patient vies that she likely has a tonsil stone, recommend simple treatment with sucking on a limited 4 times daily to increase saliva production, patient advised is okay to manipulate stone with a Q-tip or her finger if she wishes.  Patient advised to follow-up with her primary care provider regarding the cerumen her ears, she will benefit from irrigation of both ears.  ED Prescriptions   None    PDMP not reviewed this  encounter.  Pending results:  Labs Reviewed  CULTURE, GROUP A STREP [facility])  POCT RAPID STREP A (OFFICE)    Medications Ordered in UC: Medications - No data to display  Disposition Upon Discharge:  Condition: stable for discharge home Home: take medications as prescribed; routine discharge instructions as discussed; follow up as advised.  Patient presented with an acute illness with associated systemic symptoms and significant discomfort requiring urgent management. In my opinion, this is a condition that a prudent lay person (someone who possesses an average knowledge of health and medicine) may potentially expect to result in complications if not addressed urgently such as respiratory distress, impairment of bodily function or dysfunction of bodily organs.   Routine symptom specific, illness specific and/or disease specific instructions were discussed with the patient and/or caregiver at length.   As such, the patient has been evaluated and assessed, work-up was performed and treatment was provided in alignment with urgent care protocols and evidence based medicine.  Patient/parent/caregiver has been advised that the patient may require follow up for further testing and treatment if the symptoms continue in spite of treatment, as clinically indicated and appropriate.  If the patient was tested for COVID-19, Influenza and/or RSV, then the patient/parent/guardian was advised to isolate at home  pending the results of his/her diagnostic coronavirus test and potentially longer if theyre positive. I have also advised pt that if his/her COVID-19 test returns positive, it's recommended to self-isolate for at least 10 days after symptoms first appeared AND until fever-free for 24 hours without fever reducer AND other symptoms have improved or resolved. Discussed self-isolation recommendations as well as instructions for household member/close contacts as per the [facility] and [place] DHHS, and also gave patient  the [place] packet with this information.  Patient/parent/caregiver has been advised to return to the [facility] or PCP in 3-5 days if no better; to PCP or the Emergency Department if new signs and symptoms develop, or if the current signs or symptoms continue to change or worsen for further workup, evaluation and treatment as clinically indicated and appropriate  The patient will follow up with their current PCP if and as advised. If the patient does not currently have a PCP we will assist them in obtaining one.   The patient may need specialty follow up if the symptoms continue, in spite of conservative treatment and management, for further workup, evaluation, consultation and treatment as clinically indicated and appropriate.  Patient/parent/caregiver verbalized understanding and agreement of plan as discussed.  All questions were addressed during visit.  Please see discharge instructions below for further details of plan.  Discharge Instructions:   Discharge Instructions      Based on my physical exam findings and the description you provided today, I believe that you have a tonsil stone that is causing a slight foreign body sensation and looks a little bit like tiny yellow stone in the wall of the back of your throat.  I have been on good authority from the doctor I work with that sucking on a lemon wedge 4 times daily will significantly increase your saliva production and cause the stone to fall out on its own.  If you feel adventurous and want to try to dislodge the stone with either your fingertip or a Q-tip, you are certainly welcome to try.  Antibiotics are not indicated and would be of no benefit.  Gargling with salt water may provide relief of pain if you have any but otherwise the limb and treatment is the way to go.  Please remember to ask your primary care provider about cleaning out your ears, the wax in your ears does not look infected but there is enough of it there that I think you  would appreciate having this done.  Your strep test today is negative.  Throat culture will be performed per our protocol.  The result of your throat culture will be posted to your MyChart once it is complete, this typically takes 3 to 5 days.  If there is a positive result, you will be contacted by phone and antibiotics will be prescribed for you.   Thank you very much for visiting urgent care today.  I appreciate the opportunity to participate in your care.    This office note has been dictated using Museum/gallery curator.  Unfortunately, and despite my best efforts, this method of dictation can sometimes lead to occasional typographical or grammatical errors.  I apologize in advance if this occurs.     Lynden Oxford Scales, PA-C 11/08/21 1230

## 2021-11-08 NOTE — ED Triage Notes (Signed)
Pt c/o sore throat since Saturday. Noticed pustula area in back of throat.   ?

## 2021-11-11 LAB — CULTURE, GROUP A STREP (THRC)

## 2021-11-15 ENCOUNTER — Other Ambulatory Visit: Payer: Self-pay

## 2021-11-15 ENCOUNTER — Encounter: Payer: Self-pay | Admitting: Family Medicine

## 2021-11-15 ENCOUNTER — Ambulatory Visit (INDEPENDENT_AMBULATORY_CARE_PROVIDER_SITE_OTHER): Payer: Medicare Other | Admitting: Family Medicine

## 2021-11-15 VITALS — BP 128/82 | HR 64 | Temp 97.3°F | Resp 18 | Ht 67.0 in | Wt 155.0 lb

## 2021-11-15 DIAGNOSIS — Z0001 Encounter for general adult medical examination with abnormal findings: Secondary | ICD-10-CM

## 2021-11-15 DIAGNOSIS — R0789 Other chest pain: Secondary | ICD-10-CM

## 2021-11-15 DIAGNOSIS — Z13220 Encounter for screening for lipoid disorders: Secondary | ICD-10-CM

## 2021-11-15 DIAGNOSIS — M810 Age-related osteoporosis without current pathological fracture: Secondary | ICD-10-CM

## 2021-11-15 DIAGNOSIS — Z0000 Encounter for general adult medical examination without abnormal findings: Secondary | ICD-10-CM

## 2021-11-15 DIAGNOSIS — Z23 Encounter for immunization: Secondary | ICD-10-CM | POA: Diagnosis not present

## 2021-11-15 DIAGNOSIS — Z1231 Encounter for screening mammogram for malignant neoplasm of breast: Secondary | ICD-10-CM | POA: Diagnosis not present

## 2021-11-15 LAB — COMPLETE METABOLIC PANEL WITH GFR
AG Ratio: 1.6 (calc) (ref 1.0–2.5)
ALT: 12 U/L (ref 6–29)
AST: 17 U/L (ref 10–35)
Albumin: 4.4 g/dL (ref 3.6–5.1)
Alkaline phosphatase (APISO): 25 U/L — ABNORMAL LOW (ref 37–153)
BUN: 15 mg/dL (ref 7–25)
CO2: 29 mmol/L (ref 20–32)
Calcium: 9.7 mg/dL (ref 8.6–10.4)
Chloride: 104 mmol/L (ref 98–110)
Creat: 0.89 mg/dL (ref 0.50–1.05)
Globulin: 2.8 g/dL (calc) (ref 1.9–3.7)
Glucose, Bld: 91 mg/dL (ref 65–99)
Potassium: 4.5 mmol/L (ref 3.5–5.3)
Sodium: 139 mmol/L (ref 135–146)
Total Bilirubin: 0.6 mg/dL (ref 0.2–1.2)
Total Protein: 7.2 g/dL (ref 6.1–8.1)
eGFR: 71 mL/min/{1.73_m2} (ref >=60)

## 2021-11-15 LAB — CBC WITH DIFFERENTIAL/PLATELET
Absolute Monocytes: 497 cells/uL (ref 200–950)
Basophils Absolute: 31 cells/uL (ref 0–200)
Basophils Relative: 0.7 %
Eosinophils Absolute: 88 cells/uL (ref 15–500)
Eosinophils Relative: 2 %
HCT: 38 % (ref 35.0–45.0)
Hemoglobin: 12.3 g/dL (ref 11.7–15.5)
Lymphs Abs: 1558 cells/uL (ref 850–3900)
MCH: 31.2 pg (ref 27.0–33.0)
MCHC: 32.4 g/dL (ref 32.0–36.0)
MCV: 96.4 fL (ref 80.0–100.0)
MPV: 10.4 fL (ref 7.5–12.5)
Monocytes Relative: 11.3 %
Neutro Abs: 2226 cells/uL (ref 1500–7800)
Neutrophils Relative %: 50.6 %
Platelets: 292 10*3/uL (ref 140–400)
RBC: 3.94 10*6/uL (ref 3.80–5.10)
RDW: 11.7 % (ref 11.0–15.0)
Total Lymphocyte: 35.4 %
WBC: 4.4 10*3/uL (ref 3.8–10.8)

## 2021-11-15 LAB — LIPID PANEL
Cholesterol: 229 mg/dL — ABNORMAL HIGH (ref <200)
HDL: 70 mg/dL (ref >=50)
LDL Cholesterol (Calc): 131 mg/dL (calc) — ABNORMAL HIGH
Non-HDL Cholesterol (Calc): 159 mg/dL (calc) — ABNORMAL HIGH (ref <130)
Total CHOL/HDL Ratio: 3.3 (calc) (ref <5.0)
Triglycerides: 163 mg/dL — ABNORMAL HIGH (ref <150)

## 2021-11-15 NOTE — Progress Notes (Signed)
? ?Subjective:  ? ? Patient ID: Tammy Mills, female    DOB: [birth year], 66 y.o.   MRN: [id number] ? ?HPI  ?Patient is here today for complete physical exam.  Her last mammogram was in 2021.  She is due again today.  She would like me to schedule this for her.  Her last colonoscopy was in 2014.  She is due next year.  Her last bone density test was in 2021.  This showed osteoporosis with a T score of -2.6.  She is on treatment for this and is also taking calcium and vitamin D.  She is due to repeat bone density test next year.  Her last Pap smear was in 2021 and was normal.  Therefore she does not have to have this again.  She does have a seborrheic keratosis on the right side of her neck that she would like removed.  I treated this with liquid nitrogen cryotherapy today.  She is due for Prevnar 20 as well as Shingrix.  She denies any falls, depression, or memory loss.  She does report some chest wall pain.  It sounds like a sore muscle in her right chest.  She denies any angina or shortness of breath or dyspnea on exertion.  She is tender to palpation in the right anterior chest wall ?Past Medical History:  ?Diagnosis Date  ? Mitral valve prolapse   ? mild to moderate regurgitation  ? Osteoporosis   ? ?Past Surgical History:  ?Procedure Laterality Date  ? BREAST EXCISIONAL BIOPSY Right   ? 20 + yrs ago, benign  ? BREAST SURGERY    ? WISDOM TOOTH EXTRACTION    ? ?Current Outpatient Medications on File Prior to Visit  ?Medication Sig Dispense Refill  ? calcium citrate-vitamin D (CITRACAL+D) 315-200 MG-UNIT tablet Take 1 tablet by mouth 2 (two) times daily.    ? Melatonin 10 MG TABS Take 10 mg by mouth at bedtime as needed (for sleep).    ? meloxicam (MOBIC) 7.5 MG tablet TAKE 1 TABLET(7.5 MG) BY MOUTH IN THE MORNING AND AT BEDTIME 60 tablet 1  ? Multiple Vitamin (MULTIVITAMIN) tablet Take 1 tablet by mouth daily.    ? Omega-3 Fatty Acids (FISH OIL PO) Take by mouth.    ? ?Current Facility-Administered Medications on  File Prior to Visit  ?Medication Dose Route Frequency Provider Last Rate Last Admin  ? denosumab (PROLIA) injection 60 mg  60 mg Subcutaneous Q6 months Susy Frizzle, MD   60 mg at 07/20/21 1115  ? ?Allergies  ?Allergen Reactions  ? Alendronate Sodium Other (See Comments)  ? Fosamax [Alendronate]   ? Penicillins Other (See Comments)  ?  "doesn't work well with me" per patient.  ? ?Social History  ? ?Socioeconomic History  ? Marital status: Single  ?  Spouse name: Not on file  ? Number of children: Not on file  ? Years of education: Not on file  ? Highest education level: Not on file  ?Occupational History  ? Not on file  ?Tobacco Use  ? Smoking status: Former  ?  Types: Cigarettes  ?  Quit date: 09/05/2005  ?  Years since quitting: 16.2  ? Smokeless tobacco: Never  ?Substance and Sexual Activity  ? Alcohol use: Yes  ?  Alcohol/week: 3.0 standard drinks  ?  Types: 1 Glasses of wine, 2 Cans of beer per week  ?  Comment:  2 beers/ 1 glass of wine on weekends  ? Drug use: No  ?  Sexual activity: Not on file  ?Other Topics Concern  ? Not on file  ?Social History Narrative  ? 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 Lives with mom and 2 sisters  ? No children  ? Single  ? Enjoys walking/hiking/outdoors  ? Completed 2 years of college  ? ?Social Determinants of Health  ? ?Financial Resource Strain: Not on file  ?Food Insecurity: Not on file  ?Transportation Needs: Not on file  ?Physical Activity: Not on file  ?Stress: Not on file  ?Social Connections: Not on file  ?Intimate Partner Violence: Not on file  ? ?Family History  ?Problem Relation Age of Onset  ? Stroke Mother   ? Cancer Father   ?     hogkins  ? Colon cancer Maternal Grandfather 36  ? Cancer Maternal Grandfather   ?     colon  ? Hyperlipidemia Other   ? Cancer Paternal Grandmother   ?     colon  ? Heart attack Neg Hx   ? Diabetes Neg Hx   ? Hypertension Neg Hx   ? Sudden death Neg Hx   ? ? ? ? ? ?Review of Systems  ?All other systems reviewed and are  negative. ? ?   ?Objective:  ? Physical Exam ?Vitals reviewed.  ?Constitutional:   ?   General: She is not in acute distress. ?   Appearance: Normal appearance. She is normal weight. She is not ill-appearing, toxic-appearing or diaphoretic.  ?HENT:  ?   Right Ear: Tympanic membrane, ear canal and external ear normal. There is no impacted cerumen.  ?   Left Ear: Tympanic membrane, ear canal and external ear normal. There is no impacted cerumen.  ?   Nose: Nose normal. No congestion or rhinorrhea.  ?   Mouth/Throat:  ?   Mouth: Mucous membranes are moist.  ?   Pharynx: Oropharynx is clear. No oropharyngeal exudate or posterior oropharyngeal erythema.  ?Eyes:  ?   General:     ?   Right eye: No discharge.     ?   Left eye: No discharge.  ?   Extraocular Movements: Extraocular movements intact.  ?   Conjunctiva/sclera: Conjunctivae normal.  ?   Pupils: Pupils are equal, round, and reactive to light.  ?Neck:  ?   Vascular: No carotid bruit.  ?Cardiovascular:  ?   Rate and Rhythm: Normal rate and regular rhythm.  ?   Pulses: Normal pulses.  ?   Heart sounds: Normal heart sounds. No murmur heard. ?  No friction rub. No gallop.  ?Pulmonary:  ?   Effort: Pulmonary effort is normal. No respiratory distress.  ?   Breath sounds: Normal breath sounds. No stridor. No wheezing, rhonchi or rales.  ?Chest:  ?   Chest wall: No tenderness.  ?Abdominal:  ?   General: Abdomen is flat. Bowel sounds are normal. There is no distension.  ?   Palpations: Abdomen is soft.  ?   Tenderness: There is no abdominal tenderness. There is no guarding.  ?   Hernia: No hernia is present.  ?Musculoskeletal:  ?   Cervical back: Normal range of motion and neck supple. No rigidity.  ?   Right lower leg: No edema.  ?   Left lower leg: No edema.  ?Lymphadenopathy:  ?   Cervical: No cervical adenopathy.  ?Skin: ?   Coloration: Skin is not jaundiced or pale.  ?   Findings: No bruising, erythema, lesion or rash.  ?  Neurological:  ?   General: No focal deficit  present.  ?   Mental Status: She is alert and oriented to person, place, and time. Mental status is at baseline.  ?   Cranial Nerves: No cranial nerve deficit.  ?   Sensory: No sensory deficit.  ?   Motor: No weakness.  ?   Coordination: Coordination normal.  ?   Gait: Gait normal.  ?   Deep Tendon Reflexes: Reflexes normal.  ?Psychiatric:     ?   Mood and Affect: Mood normal.     ?   Behavior: Behavior normal.     ?   Thought Content: Thought content normal.     ?   Judgment: Judgment normal.  ? ? ? ? ? ?   ?Assessment & Plan:  ?Encounter for screening mammogram for malignant neoplasm of breast - Plan: MM Digital Screening ? ?Screening for cholesterol level - Plan: CBC with Differential/Platelet, Lipid panel, COMPLETE METABOLIC PANEL WITH GFR ? ?General medical exam - Plan: CBC with Differential/Platelet, Lipid panel, COMPLETE METABOLIC PANEL WITH GFR ? ?Osteoporosis, unspecified osteoporosis type, unspecified pathological fracture presence - Plan: CBC with Differential/Platelet, Lipid panel, COMPLETE METABOLIC PANEL WITH GFR ? ?Chest wall pain - Plan: CBC with Differential/Platelet, Lipid panel, COMPLETE METABOLIC PANEL WITH GFR ?Patient has right anterior chest wall pain.  I believe this is muscular.  No treatment is necessary.  I will check a CBC, CMP, fasting lipid panel.  Schedule patient for mammogram.  Treated as seborrheic keratosis on the right side of her neck with liquid nitrogen cryotherapy.  Bone density is not due until next year.  Colonoscopy is due next year.  Pap smear is up-to-date.  Patient received Prevnar 20.  Recommended Shingrix.  Patient denies any falls or depression or memory loss. ?

## 2021-11-15 NOTE — Addendum Note (Signed)
Addended by: Shelby Dubin on: 11/15/2021 09:16 AM ? ? Modules accepted: Orders ? ?

## 2021-11-17 ENCOUNTER — Other Ambulatory Visit: Payer: Self-pay | Admitting: Orthopaedic Surgery

## 2021-11-24 ENCOUNTER — Ambulatory Visit
Admission: RE | Admit: 2021-11-24 | Discharge: 2021-11-24 | Disposition: A | Discharge status: Home / Self Care | Payer: Medicare Other | Source: Ambulatory Visit | Attending: Family Medicine | Admitting: Family Medicine

## 2021-11-24 DIAGNOSIS — Z1231 Encounter for screening mammogram for malignant neoplasm of breast: Secondary | ICD-10-CM

## 2022-01-16 ENCOUNTER — Other Ambulatory Visit: Payer: Self-pay | Admitting: Physician Assistant

## 2022-01-24 ENCOUNTER — Encounter: Payer: Self-pay | Admitting: Physician Assistant

## 2022-01-24 ENCOUNTER — Ambulatory Visit (INDEPENDENT_AMBULATORY_CARE_PROVIDER_SITE_OTHER): Payer: Medicare Other

## 2022-01-24 ENCOUNTER — Ambulatory Visit (INDEPENDENT_AMBULATORY_CARE_PROVIDER_SITE_OTHER): Payer: Medicare Other | Admitting: Physician Assistant

## 2022-01-24 ENCOUNTER — Encounter: Payer: Self-pay | Admitting: Family Medicine

## 2022-01-24 ENCOUNTER — Ambulatory Visit: Payer: Medicare Other

## 2022-01-24 VITALS — Ht 67.0 in | Wt 155.0 lb

## 2022-01-24 DIAGNOSIS — M1711 Unilateral primary osteoarthritis, right knee: Secondary | ICD-10-CM

## 2022-01-24 DIAGNOSIS — M1712 Unilateral primary osteoarthritis, left knee: Secondary | ICD-10-CM

## 2022-01-24 NOTE — Progress Notes (Signed)
Office Visit Note   Patient: Tammy Mills           Date of Birth: 1956-07-23           MRN: [id number] Visit Date: 01/24/2022              Requested by: Susy Frizzle, MD [address] [place],  [place] [address] PCP: Susy Frizzle, MD   Assessment & Plan: Visit Diagnoses:  1. Primary osteoarthritis of right knee   2. Primary osteoarthritis of left knee     Plan: Due to the fact that patient's failed conservative treatment which has included strengthening of quads and knee friendly exercises, time and cortisone injections in yet still has pain that is interfering with her life.  Recommend total knee replacement.  Right knee is most painful therefore recommend starting with the right knee.  Risk benefits of surgery discussed with patient at length.  Risk include but not limited to DVT/PE, wound healing problems, infection, blood loss, nerve or vessel injury prolonged worsening pain.  Questions were encouraged and answered by Dr. Ninfa Linden and myself.  Discussed the surgical procedure along with the postoperative protocol.  Patient will require surgical clearance due to her history of mitral valve prolapse.  Follow-Up Instructions: Return for post op.   Orders:  Orders Placed This Encounter  Procedures   XR Knee 1-2 Views Right   XR Knee 1-2 Views Left   No orders of the defined types were placed in this encounter.     Procedures: No procedures performed   Clinical Data: No additional findings.   Subjective: Chief Complaint  Patient presents with   Right Knee - Pain   Left Knee - Pain    HPI Tammy Mills comes in today with bilateral knee pain.  Right greater than left.  She is last given cortisone injection right knee on 01/18/2021 helped for a while.  She states she feels like the right knee is getting stuck at times.  She has left knee pain with standing.  She states "not living life the way I want".  She states if she exercises it takes her several weeks  before she can exercise again due to the pain in both knees.  She is taking meloxicam which helps some.  She has had no new injury to either knee.  She is nondiabetic.  Denies any fevers or chills. Review of Systems Please see HPI otherwise negative or noncontributory  Objective: Vital Signs: Ht _0  (1.702 m)   Wt 155 lb (70.3 kg)   BMI 24.28 kg/m   Physical Exam Constitutional:      Appearance: She is normal weight.  Pulmonary:     Effort: Pulmonary effort is normal.  Neurological:     Mental Status: She is alert and oriented to person, place, and time.  Psychiatric:        Mood and Affect: Mood normal.    Ortho Exam Bilateral knees good range of motion.  No abnormal warmth erythema or effusion.  Left knee significant patellofemoral crepitus.  Obvious valgus deformities of both knees.  No gross instability valgus varus stressing of either knee.  Calf supple nontender.  Ambulates without any assistive device. Specialty Comments:  No specialty comments available.  Imaging: XR Knee 1-2 Views Left  Result Date: 01/24/2022 Left knee: End-stage tricompartmental arthritis with bone-on-bone lateral compartment valgus deformity.  Severe end-stage patellofemoral arthritic changes.  No acute fractures.  The knee is well located.  XR Knee 1-2 Views Right  Result Date: 01/24/2022 Right knee 2 views: Moderate to severe patellofemoral arthritic changes.  Bone-on-bone lateral compartment with valgus deformity.  He is well located.  No bony abnormalities otherwise.    PMFS History: Patient Active Problem List   Diagnosis Date Noted   Osteoporosis    Mitral valve prolapse    Osteopenia 05/07/2013   Routine general medical examination at a health care facility 04/30/2013   Right knee pain 05/08/2012   Right hip pain 05/08/2012   Knee pain, bilateral 06/03/2011   WEIGHT GAIN 06/28/2010   Past Medical History:  Diagnosis Date   Mitral valve prolapse    mild to moderate regurgitation    Osteoporosis     Family History  Problem Relation Age of Onset   Stroke Mother    Cancer Father        hogkins   Colon cancer Maternal Grandfather 71   Cancer Maternal Grandfather        colon   Hyperlipidemia Other    Cancer Paternal Grandmother        colon   Heart attack Neg Hx    Diabetes Neg Hx    Hypertension Neg Hx    Sudden death Neg Hx     Past Surgical History:  Procedure Laterality Date   BREAST EXCISIONAL BIOPSY Right    20 + yrs ago, benign   BREAST SURGERY     WISDOM TOOTH EXTRACTION     Social History   Occupational History   Not on file  Tobacco Use   Smoking status: Former    Types: Cigarettes    Quit date: 09/05/2005    Years since quitting: 16.3   Smokeless tobacco: Never  Substance and Sexual Activity   Alcohol use: Yes    Alcohol/week: 3.0 standard drinks    Types: 1 Glasses of wine, 2 Cans of beer per week    Comment:  2 beers/ 1 glass of wine on weekends   Drug use: No   Sexual activity: Not on file

## 2022-01-28 ENCOUNTER — Ambulatory Visit (INDEPENDENT_AMBULATORY_CARE_PROVIDER_SITE_OTHER): Payer: Medicare Other | Admitting: Family Medicine

## 2022-01-28 VITALS — BP 118/66 | HR 62 | Temp 97.9°F | Ht 67.0 in | Wt 152.4 lb

## 2022-01-28 DIAGNOSIS — I340 Nonrheumatic mitral (valve) insufficiency: Secondary | ICD-10-CM

## 2022-01-28 NOTE — Progress Notes (Signed)
Subjective:    Patient ID: Tammy Mills, female    DOB: 05/05/1956, 66 y.o.   MRN: [id number]  HPI  In 2021, patient had an echocardiogram as part of a work-up for syncope.  Echocardiogram revealed mitral valve prolapse with mild to moderate mitral valve regurgitation.  This is completely asymptomatic.  Patient is having elective knee surgery performed and orthopedics has requested a cardiology clearance prior to proceeding with her elective knee surgery.  At the present time, patient denies any chest pain shortness of breath dyspnea on exertion syncope near syncope lightheadedness although she does occasionally have palpitations that I suspect her PVCs are related to the mitral valve prolapse. Past Medical History:  Diagnosis Date   Mitral valve prolapse    mild to moderate regurgitation   Osteoporosis    Past Surgical History:  Procedure Laterality Date   BREAST EXCISIONAL BIOPSY Right    20 + yrs ago, benign   BREAST SURGERY     WISDOM TOOTH EXTRACTION     Current Outpatient Medications on File Prior to Visit  Medication Sig Dispense Refill   calcium citrate-vitamin D (CITRACAL+D) 315-200 MG-UNIT tablet Take 1 tablet by mouth 2 (two) times daily.     Melatonin 10 MG TABS Take 10 mg by mouth at bedtime as needed (for sleep).     meloxicam (MOBIC) 7.5 MG tablet TAKE 1 TABLET BY MOUTH EVERY MORNING AND EVERY NIGHT AT BEDTIME 60 tablet 1   Multiple Vitamin (MULTIVITAMIN) tablet Take 1 tablet by mouth daily.     Omega-3 Fatty Acids (FISH OIL PO) Take by mouth.     Current Facility-Administered Medications on File Prior to Visit  Medication Dose Route Frequency Provider Last Rate Last Admin   denosumab (PROLIA) injection 60 mg  60 mg Subcutaneous Q6 months Susy Frizzle, MD   60 mg at 07/20/21 1115   Allergies  Allergen Reactions   Alendronate Sodium Other (See Comments)   Fosamax [Alendronate]    Penicillins Other (See Comments)    "doesn't work well with me" per patient.    Social History   Socioeconomic History   Marital status: Single    Spouse name: Not on file   Number of children: Not on file   Years of education: Not on file   Highest education level: Not on file  Occupational History   Not on file  Tobacco Use   Smoking status: Former    Types: Cigarettes    Quit date: 09/05/2005    Years since quitting: 16.4   Smokeless tobacco: Never  Substance and Sexual Activity   Alcohol use: Yes    Alcohol/week: 3.0 standard drinks    Types: 1 Glasses of wine, 2 Cans of beer per week    Comment:  2 beers/ 1 glass of wine on weekends   Drug use: No   Sexual activity: Not on file  Other Topics Concern   Not on file  Social History Narrative   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   Lives with mom and 2 sisters   No children   Single   Enjoys walking/hiking/outdoors   Completed 2 years of college   Social Determinants of Health   Financial Resource Strain: Not on file  Food Insecurity: Not on file  Transportation Needs: Not on file  Physical Activity: Not on file  Stress: Not on file  Social Connections: Not on file  Intimate Partner Violence: Not on file   Family History  Problem  Relation Age of Onset   Stroke Mother    Cancer Father        hogkins   Colon cancer Maternal Grandfather 67   Cancer Maternal Grandfather        colon   Hyperlipidemia Other    Cancer Paternal Grandmother        colon   Heart attack Neg Hx    Diabetes Neg Hx    Hypertension Neg Hx    Sudden death Neg Hx        Review of Systems  All other systems reviewed and are negative.     Objective:   Physical Exam Vitals reviewed.  Constitutional:      General: She is not in acute distress.    Appearance: Normal appearance. She is normal weight. She is not ill-appearing, toxic-appearing or diaphoretic.  HENT:     Right Ear: Tympanic membrane, ear canal and external ear normal. There is no impacted cerumen.     Left Ear: Tympanic membrane,  ear canal and external ear normal. There is no impacted cerumen.     Nose: Nose normal. No congestion or rhinorrhea.     Mouth/Throat:     Mouth: Mucous membranes are moist.     Pharynx: Oropharynx is clear. No oropharyngeal exudate or posterior oropharyngeal erythema.  Eyes:     General:        Right eye: No discharge.        Left eye: No discharge.     Extraocular Movements: Extraocular movements intact.     Conjunctiva/sclera: Conjunctivae normal.     Pupils: Pupils are equal, round, and reactive to light.  Neck:     Vascular: No carotid bruit.  Cardiovascular:     Rate and Rhythm: Normal rate and regular rhythm.     Pulses: Normal pulses.     Heart sounds: Normal heart sounds. No murmur heard.   No friction rub. No gallop.  Pulmonary:     Effort: Pulmonary effort is normal. No respiratory distress.     Breath sounds: Normal breath sounds. No stridor. No wheezing, rhonchi or rales.  Chest:     Chest wall: No tenderness.  Abdominal:     General: Abdomen is flat. Bowel sounds are normal. There is no distension.     Palpations: Abdomen is soft.     Tenderness: There is no abdominal tenderness. There is no guarding.     Hernia: No hernia is present.  Musculoskeletal:     Cervical back: Normal range of motion and neck supple. No rigidity.     Right lower leg: No edema.     Left lower leg: No edema.  Lymphadenopathy:     Cervical: No cervical adenopathy.  Skin:    Coloration: Skin is not jaundiced or pale.     Findings: No bruising, erythema, lesion or rash.  Neurological:     General: No focal deficit present.     Mental Status: She is alert and oriented to person, place, and time. Mental status is at baseline.     Cranial Nerves: No cranial nerve deficit.     Sensory: No sensory deficit.     Motor: No weakness.     Coordination: Coordination normal.     Gait: Gait normal.     Deep Tendon Reflexes: Reflexes normal.  Psychiatric:        Mood and Affect: Mood normal.         Behavior: Behavior normal.  Thought Content: Thought content normal.        Judgment: Judgment normal.          Assessment & Plan:  Nonrheumatic mitral valve regurgitation - Plan: Ambulatory referral to Cardiology I am unable to appreciate a murmur on exam.  She is asymptomatic.  Therefore I do not feel that there is any cardiac restrictions prior to her upcoming elective knee surgery.  I will happily get a second opinion with cardiology as requested by orthopedics.  However I tried to assuage the patient's fears.  This is something that simply needs to be monitored and at the present time she is doing quite well.

## 2022-02-07 ENCOUNTER — Telehealth: Payer: Self-pay | Admitting: *Deleted

## 2022-02-07 NOTE — Telephone Encounter (Signed)
   Pre-operative Risk Assessment    Patient Name: Tammy Mills  DOB: 08/20/1956 MRN: [id number]      Request for Surgical Clearance    Procedure:   LEFT TOTAL KNEE ARTHROPLASTY  Date of Surgery:  Clearance 04/01/22                                 Surgeon:  DR. Jean Rosenthal Surgeon's Group or Practice Name:  [facility] [place] Phone number:  [phone] Fax number:  [phone] ATTN: SHERRIE   Type of Clearance Requested:   - Medical    Type of Anesthesia:  Spinal & BLOCK   Additional requests/questions:    Jiles Prows   02/07/2022, 3:25 PM

## 2022-02-09 ENCOUNTER — Encounter: Payer: Self-pay | Admitting: Cardiovascular Disease

## 2022-02-09 ENCOUNTER — Ambulatory Visit (INDEPENDENT_AMBULATORY_CARE_PROVIDER_SITE_OTHER): Payer: Medicare Other | Admitting: Cardiovascular Disease

## 2022-02-09 VITALS — BP 122/62 | HR 65 | Ht 67.0 in | Wt 153.4 lb

## 2022-02-09 DIAGNOSIS — Z01810 Encounter for preprocedural cardiovascular examination: Secondary | ICD-10-CM

## 2022-02-09 DIAGNOSIS — I340 Nonrheumatic mitral (valve) insufficiency: Secondary | ICD-10-CM

## 2022-02-09 NOTE — Progress Notes (Signed)
Cardiology Office Note:    Date:  02/11/2022   ID:  Tammy Mills, DOB 11/07/1955, MRN [id number]  PCP:  Susy Frizzle, MD   [facility] Providers Cardiologist:  None     Referring MD: Susy Frizzle, MD   Chief Complaint  Patient presents with   Heart Murmur    History of Present Illness:    Tammy Mills is a 66 y.o. female with a hx of mitral valve prolapse with mild to moderate mitral regurgitation, referred for preoperative cardiovascular exam.  The patient has advanced osteoarthritis in her knees, and is pending total knee replacement.  She had an echocardiogram performed in 2021 after an episode of syncope.  This demonstrated moderate late systolic prolapse of the mitral valve with mild to moderate mitral regurgitation.  There were no other significant abnormality seen.  The patient has no history of cardiac symptoms and specifically denies any chest pain, chest pressure, shortness of breath, orthopnea, PND, or heart palpitations.  She had an episode of syncope in 2021 that was an isolated event when she thinks she was dehydrated and it occurred after she took a hot shower.  She has had no recurrence.  Her work-up at that time was negative.  Past Medical History:  Diagnosis Date   Mitral valve prolapse    mild to moderate regurgitation   Osteoporosis     Past Surgical History:  Procedure Laterality Date   BREAST EXCISIONAL BIOPSY Right    20 + yrs ago, benign   BREAST SURGERY     WISDOM TOOTH EXTRACTION      Current Medications: Current Meds  Medication Sig   calcium citrate-vitamin D (CITRACAL+D) 315-200 MG-UNIT tablet Take 1 tablet by mouth 2 (two) times daily.   Melatonin 10 MG TABS Take 10 mg by mouth at bedtime as needed (for sleep).   meloxicam (MOBIC) 7.5 MG tablet TAKE 1 TABLET BY MOUTH EVERY MORNING AND EVERY NIGHT AT BEDTIME   Multiple Vitamin (MULTIVITAMIN) tablet Take 1 tablet by mouth daily.   Omega-3 Fatty Acids (FISH OIL PO) Take by mouth.    Current Facility-Administered Medications for the 02/09/22 encounter (Office Visit) with Sherren Mocha, MD  Medication   denosumab (PROLIA) injection 60 mg     Allergies:   Alendronate sodium, Fosamax [alendronate], and Penicillins   Social History   Socioeconomic History   Marital status: Single    Spouse name: Not on file   Number of children: Not on file   Years of education: Not on file   Highest education level: Not on file  Occupational History   Not on file  Tobacco Use   Smoking status: Former    Types: Cigarettes    Quit date: 09/05/2005    Years since quitting: 16.4   Smokeless tobacco: Never  Substance and Sexual Activity   Alcohol use: Yes    Alcohol/week: 3.0 standard drinks of alcohol    Types: 1 Glasses of wine, 2 Cans of beer per week    Comment:  2 beers/ 1 glass of wine on weekends   Drug use: No   Sexual activity: Not on file  Other Topics Concern   Not on file  Social History Narrative   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 There is no history of Heart attack, Diabetes, Hypertension, or Sudden death.  ROS:   Please see the history of present illness.    All other systems reviewed and are negative.  EKGs/Labs/Other Studies Reviewed:    The following studies were reviewed today: Echo 12/27/2019:  1. Left ventricular ejection fraction, by  estimation, is 60 to 65%. The  left ventricle has normal function. The left ventricle has no regional  wall motion abnormalities. Left ventricular diastolic parameters are  consistent with Grade I diastolic  dysfunction (impaired relaxation).   2. Right ventricular systolic function is normal. The right ventricular  size is normal. There is mildly elevated pulmonary artery systolic  pressure.   3. The mitral valve is myxomatous. There is moderate late systolic  prolapse of both leaflets of the mitral valve. Mild to moderate mitral  valve regurgitation.   4. Tricuspid valve regurgitation is mild to moderate.   5. The aortic valve is normal in structure. Aortic valve regurgitation is  trivial.   6. The inferior vena cava is normal in size with greater than 50%  respiratory variability, suggesting right atrial pressure of 3 mmHg.  EKG:  EKG is ordered today.  The ekg ordered today demonstrates NSR 65 bpm, within normal limits  Recent Labs: 11/15/2021: ALT 12; BUN 15; Creat 0.89; Hemoglobin 12.3; Platelets 292; Potassium 4.5; Sodium 139  Recent Lipid Panel    Component Value Date/Time   CHOL 229 (H) 11/15/2021 0910   TRIG 163 (H) 11/15/2021 0910   HDL 70 11/15/2021 0910   CHOLHDL 3.3 11/15/2021 0910   VLDL 33 04/30/2013 1154   LDLCALC 131 (H) 11/15/2021 0910     Risk Assessment/Calculations:           Physical Exam:    VS:  BP 122/62   Pulse 65   Ht 5' 7" (1.702 m)   Wt 153 lb 6.4 oz (69.6 kg)   SpO2 98%   BMI 24.03 kg/m     Wt Readings from Last 3 Encounters:  02/09/22 153 lb 6.4 oz (69.6 kg)  01/28/22 152 lb 6.4 oz (69.1 kg)  01/24/22 155 lb (70.3 kg)     GEN:  Well nourished, well developed in no acute distress HEENT: Normal NECK: No JVD; No carotid bruits LYMPHATICS: No lymphadenopathy CARDIAC: RRR, no murmurs, rubs, gallops RESPIRATORY:  Clear to auscultation without rales, wheezing or rhonchi  ABDOMEN: Soft, non-tender, non-distended MUSCULOSKELETAL:  No  edema; No deformity  SKIN: Warm and dry NEUROLOGIC:  Alert and oriented x 3 PSYCHIATRIC:  Normal affect   ASSESSMENT:    1. Preop cardiovascular exam   2. Nonrheumatic mitral valve regurgitation    PLAN:    In order of problems listed above:  The patient is pending knee replacement surgery.  She has no cardiac symptoms at this time.  I do not appreciate a heart murmur on her exam.  We will obtain an echocardiogram since she has a history of mitral valve prolapse with mitral regurgitation, but I do not anticipate her having any contraindication to surgery.  She has no  history of any cardiac symptoms and is able to achieve greater than 4 metabolic equivalents without limitation.  Once her echo is reviewed, she can proceed with surgery at low risk of cardiac complication. We reviewed the natural history of mitral valve prolapse today.  I do not appreciate a significant murmur on her exam.  Will obtain a 2D echocardiogram to better evaluate since her last echocardiogram from 2021 demonstrated mitral valve prolapse with mild to moderate mitral regurgitation.     Medication Adjustments/Labs and Tests Ordered: Current medicines are reviewed at length with the patient today.  Concerns regarding medicines are outlined above.  Orders Placed This Encounter  Procedures   EKG 12-Lead   ECHOCARDIOGRAM COMPLETE   No orders of the defined types were placed in this encounter.   Patient Instructions  Medication Instructions:  Your physician recommends that you continue on your current medications as directed. Please refer to the Current Medication list given to you today.  *If you need a refill on your cardiac medications before your next appointment, please call your pharmacy*   Lab Work: NONE If you have labs (blood work) drawn today and your tests are completely normal, you will receive your results only by: [place] (if you have MyChart) OR A paper copy in the mail If you have any  lab test that is abnormal or we need to change your treatment, we will call you to review the results.   Testing/Procedures: ECHO Your physician has requested that you have an echocardiogram. Echocardiography is a painless test that uses sound waves to create images of your heart. It provides your doctor with information about the size and shape of your heart and how well your heart's chambers and valves are working. This procedure takes approximately one hour. There are no restrictions for this procedure.  Follow-Up: At [facility], you and your health needs are our priority.  As part of our continuing mission to provide you with exceptional heart care, we have created designated Provider Care Teams.  These Care Teams include your primary Cardiologist (physician) and Advanced Practice Providers (APPs -  Physician Assistants and Nurse Practitioners) who all work together to provide you with the care you need, when you need it.  Your next appointment:   As Needed  Provider: Sherren Mocha MD       Important Information About Sugar         Signed, Sherren Mocha, MD  02/11/2022 6:13 AM    [place]

## 2022-02-09 NOTE — Patient Instructions (Signed)
Medication Instructions:  Your physician recommends that you continue on your current medications as directed. Please refer to the Current Medication list given to you today.  *If you need a refill on your cardiac medications before your next appointment, please call your pharmacy*   Lab Work: NONE If you have labs (blood work) drawn today and your tests are completely normal, you will receive your results only by: [place] (if you have MyChart) OR A paper copy in the mail If you have any lab test that is abnormal or we need to change your treatment, we will call you to review the results.   Testing/Procedures: ECHO Your physician has requested that you have an echocardiogram. Echocardiography is a painless test that uses sound waves to create images of your heart. It provides your doctor with information about the size and shape of your heart and how well your heart's chambers and valves are working. This procedure takes approximately one hour. There are no restrictions for this procedure.  Follow-Up: At [facility], you and your health needs are our priority.  As part of our continuing mission to provide you with exceptional heart care, we have created designated Provider Care Teams.  These Care Teams include your primary Cardiologist (physician) and Advanced Practice Providers (APPs -  Physician Assistants and Nurse Practitioners) who all work together to provide you with the care you need, when you need it.  Your next appointment:   As Needed  Provider: Sherren Mocha MD       Important Information About Sugar

## 2022-02-11 ENCOUNTER — Encounter: Payer: Self-pay | Admitting: Cardiovascular Disease

## 2022-02-18 ENCOUNTER — Encounter: Payer: Self-pay | Admitting: Family Medicine

## 2022-02-24 ENCOUNTER — Other Ambulatory Visit: Payer: Self-pay

## 2022-02-28 ENCOUNTER — Ambulatory Visit (HOSPITAL_COMMUNITY): Payer: Medicare Other | Attending: Internal Medicine

## 2022-02-28 DIAGNOSIS — I340 Nonrheumatic mitral (valve) insufficiency: Secondary | ICD-10-CM | POA: Diagnosis present

## 2022-02-28 LAB — ECHOCARDIOGRAM COMPLETE
Area-P 1/2: 3.4 cm2
P 1/2 time: 582 msec
S' Lateral: 3.1 cm

## 2022-03-08 NOTE — Telephone Encounter (Signed)
Ok to proceed with surgery. Echo reviewed showing mild MVP, mild MR, unremarkable study.

## 2022-03-09 NOTE — Telephone Encounter (Signed)
   Patient Name: Tammy Mills  DOB: 1956/08/11 MRN: [id number]  Primary Cardiologist: Sherren Mocha, MD  Chart reviewed as part of pre-operative protocol coverage. Given past medical history and time since last visit, based on ACC/AHA guidelines, [name] would be at acceptable risk for the planned procedure without further cardiovascular testing.   The patient was advised that if she develops new symptoms prior to surgery to contact our office to arrange for a follow-up visit, and she verbalized understanding.  I will route this recommendation to the requesting party via Epic fax function and remove from pre-op pool.  Please call with questions.  Lenna Sciara, NP 03/09/2022, 10:20 AM

## 2022-03-16 ENCOUNTER — Other Ambulatory Visit: Payer: Self-pay | Admitting: Orthopaedic Surgery

## 2022-03-18 NOTE — Patient Instructions (Signed)
DUE TO COVID-19 ONLY TWO VISITORS  (aged 68 and older)  ARE ALLOWED TO COME WITH YOU AND STAY IN THE WAITING ROOM ONLY DURING PRE OP AND PROCEDURE.   **NO VISITORS ARE ALLOWED IN THE SHORT STAY AREA OR RECOVERY ROOM!!**  IF YOU WILL BE ADMITTED INTO THE HOSPITAL YOU ARE ALLOWED ONLY FOUR SUPPORT PEOPLE DURING VISITATION HOURS ONLY (7 AM -8PM)   The support person(s) must pass our screening, gel in and out, and wear a mask at all times, including in the patient's room. Patients must also wear a mask when staff or their support person are in the room. Visitors GUEST BADGE MUST BE WORN VISIBLY  One adult visitor may remain with you overnight and MUST be in the room by 8 P.M.     Your procedure is scheduled on: 04/01/22   Report to [facility] Main Entrance    Report to admitting at 8:20 AM   Call this number if you have problems the morning of surgery [phone]   Do not eat food :After Midnight.   After Midnight you may have the following liquids until 7:50 AM DAY OF SURGERY  Water Black Coffee (sugar ok, NO MILK/CREAM OR CREAMERS)  Tea (sugar ok, NO MILK/CREAM OR CREAMERS) regular and decaf                             Plain Jell-O (NO RED)                                           Fruit ices (not with fruit pulp, NO RED)                                     Popsicles (NO RED)                                                                  Juice: apple, WHITE grape, WHITE cranberry Sports drinks like Gatorade (NO RED) Clear broth(vegetable,chicken,beef)  FOLLOW BOWEL PREP AND ANY ADDITIONAL PRE OP INSTRUCTIONS YOU RECEIVED FROM YOUR SURGEON'S OFFICE!!!     Oral Hygiene is also important to reduce your risk of infection.                                    Remember - BRUSH YOUR TEETH THE MORNING OF SURGERY WITH YOUR REGULAR TOOTHPASTE   Take these medicines the morning of surgery with A SIP OF WATER: None                              You may not have any metal on your  body including hair pins, jewelry, and body piercing             Do not wear make-up, lotions, powders, perfumes, or deodorant  Do not wear nail polish including gel and S&S, artificial/acrylic nails, or any other type of covering on  natural nails including finger and toenails. If you have artificial nails, gel coating, etc. that needs to be removed by a nail salon please have this removed prior to surgery or surgery may need to be canceled/ delayed if the surgeon/ anesthesia feels like they are unable to be safely monitored.   Do not shave  48 hours prior to surgery.    Do not bring valuables to the hospital. [place].   Bring small overnight bag day of surgery.   DO NOT [place]. PHARMACY WILL DISPENSE MEDICATIONS LISTED ON YOUR MEDICATION LIST TO YOU DURING YOUR ADMISSION [place]!    Special Instructions: Bring a copy of your healthcare power of attorney and living will documents         the day of surgery if you haven't scanned them before.              Please read over the following fact sheets you were given: IF YOU HAVE QUESTIONS ABOUT YOUR PRE-OP INSTRUCTIONS PLEASE CALL [place] - Preparing for Surgery Before surgery, you can play an important role.  Because skin is not sterile, your skin needs to be as free of germs as possible.  You can reduce the number of germs on your skin by washing with CHG (chlorahexidine gluconate) soap before surgery.  CHG is an antiseptic cleaner which kills germs and bonds with the skin to continue killing germs even after washing. Please DO NOT use if you have an allergy to CHG or antibacterial soaps.  If your skin becomes reddened/irritated stop using the CHG and inform your nurse when you arrive at Short Stay. Do not shave (including legs and underarms) for at least 48 hours prior to the first CHG shower.  You may shave your  face/neck.  Please follow these instructions carefully:  1.  Shower with CHG Soap the night before surgery and the  morning of surgery.  2.  If you choose to wash your hair, wash your hair first as usual with your normal  shampoo.  3.  After you shampoo, rinse your hair and body thoroughly to remove the shampoo.                             4.  Use CHG as you would any other liquid soap.  You can apply chg directly to the skin and wash.  Gently with a scrungie or clean washcloth.  5.  Apply the CHG Soap to your body ONLY FROM THE NECK DOWN.   Do   not use on face/ open                           Wound or open sores. Avoid contact with eyes, ears mouth and   genitals (private parts).                       Wash face,  Genitals (private parts) with your normal soap.             6.  Wash thoroughly, paying special attention to the area where your    surgery  will be performed.  7.  Thoroughly rinse your body with warm water from the neck down.  8.  DO NOT shower/wash with your normal soap after using and rinsing off the CHG Soap.                9.  Pat yourself dry with a clean towel.            10.  Wear clean pajamas.            11.  Place clean sheets on your bed the night of your first shower and do not  sleep with pets. Day of Surgery : Do not apply any lotions/deodorants the morning of surgery.  Please wear clean clothes to the hospital/surgery center.  FAILURE TO FOLLOW THESE INSTRUCTIONS MAY RESULT IN THE CANCELLATION OF YOUR SURGERY  PATIENT SIGNATURE_________________________________  NURSE SIGNATURE__________________________________  ________________________________________________________________________

## 2022-03-18 NOTE — Progress Notes (Signed)
Please place orders for PAT appointment scheduled 03/21/22. 

## 2022-03-18 NOTE — Progress Notes (Signed)
COVID Vaccine Completed: yes x4  Date of COVID positive in last 90 days: no  PCP - Lynnea Ferrier, MD Cardiologist - Tonny Bollman, MD  Cardiac clearance by Danielle Rankin 03/09/22 By Bernadene Person in Epic  Chest x-ray - n/a EKG - 02/09/22 Epic Stress Test - n/a ECHO - 02/28/22 Epic Cardiac Cath - n/a Pacemaker/ICD device last checked: n/a Spinal Cord Stimulator: n/a  Bowel Prep - no  Sleep Study - n/a CPAP -   Fasting Blood Sugar - n/a Checks Blood Sugar _____ times a day  Blood Thinner Instructions: n/a Aspirin Instructions: Last Dose:  Activity level: Can go up a flight of stairs and perform activities of daily living without stopping and without symptoms of chest pain or shortness of breath.   Anesthesia review:   Patient denies shortness of breath, fever, cough and chest pain at PAT appointment  Patient verbalized understanding of instructions that were given to them at the PAT appointment. Patient was also instructed that they will need to review over the PAT instructions again at home before surgery.

## 2022-03-21 ENCOUNTER — Encounter (HOSPITAL_COMMUNITY): Payer: Self-pay

## 2022-03-21 ENCOUNTER — Encounter (HOSPITAL_COMMUNITY)
Admission: RE | Admit: 2022-03-21 | Discharge: 2022-03-21 | Disposition: A | Discharge status: Home / Self Care | Payer: Medicare Other | Source: Ambulatory Visit | Attending: Orthopaedic Surgery | Admitting: Orthopaedic Surgery

## 2022-03-21 VITALS — BP 159/83 | HR 67 | Temp 98.4°F | Resp 12 | Ht 67.0 in | Wt 149.0 lb

## 2022-03-21 DIAGNOSIS — I341 Nonrheumatic mitral (valve) prolapse: Secondary | ICD-10-CM | POA: Insufficient documentation

## 2022-03-21 DIAGNOSIS — Z01812 Encounter for preprocedural laboratory examination: Secondary | ICD-10-CM | POA: Insufficient documentation

## 2022-03-21 DIAGNOSIS — Z01818 Encounter for other preprocedural examination: Secondary | ICD-10-CM

## 2022-03-21 LAB — BASIC METABOLIC PANEL
Anion gap: 8 (ref 5–15)
BUN: 15 mg/dL (ref 8–23)
CO2: 24 mmol/L (ref 22–32)
Calcium: 10.2 mg/dL (ref 8.9–10.3)
Chloride: 106 mmol/L (ref 98–111)
Creatinine, Ser: 0.98 mg/dL (ref 0.44–1.00)
GFR, Estimated: >60 mL/min (ref >60)
Glucose, Bld: 102 mg/dL — ABNORMAL HIGH (ref 70–99)
Potassium: 4.7 mmol/L (ref 3.5–5.1)
Sodium: 138 mmol/L (ref 135–145)

## 2022-03-21 LAB — CBC
HCT: 37.4 % (ref 36.0–46.0)
Hemoglobin: 12.5 g/dL (ref 12.0–15.0)
MCH: 30.6 pg (ref 26.0–34.0)
MCHC: 33.4 g/dL (ref 30.0–36.0)
MCV: 91.4 fL (ref 80.0–100.0)
Platelets: 285 10*3/uL (ref 150–400)
RBC: 4.09 MIL/uL (ref 3.87–5.11)
RDW: 13.7 % (ref 11.5–15.5)
WBC: 6 10*3/uL (ref 4.0–10.5)
nRBC: 0 % (ref 0.0–0.2)

## 2022-03-21 LAB — SURGICAL PCR SCREEN
MRSA, PCR: NEGATIVE
Staphylococcus aureus: NEGATIVE

## 2022-03-21 NOTE — Telephone Encounter (Signed)
Please advise 

## 2022-03-25 ENCOUNTER — Other Ambulatory Visit: Payer: Self-pay | Admitting: Physician Assistant

## 2022-03-25 DIAGNOSIS — M1712 Unilateral primary osteoarthritis, left knee: Secondary | ICD-10-CM

## 2022-03-31 DIAGNOSIS — M1712 Unilateral primary osteoarthritis, left knee: Secondary | ICD-10-CM

## 2022-03-31 NOTE — H&P (Signed)
TOTAL KNEE ADMISSION H&P  Patient is being admitted for left total knee arthroplasty.  Subjective:  Chief Complaint:left knee pain.  HPI: Tammy Mills, 66 y.o. female, has a history of pain and functional disability in the left knee due to arthritis and has failed non-surgical conservative treatments for greater than 12 weeks to includeNSAID's and/or analgesics, corticosteriod injections, viscosupplementation injections, flexibility and strengthening excercises, use of assistive devices, and activity modification.  Onset of symptoms was gradual, starting 4 years ago with gradually worsening course since that time. The patient noted no past surgery on the left knee(s).  Patient currently rates pain in the left knee(s) at 10 out of 10 with activity. Patient has night pain, worsening of pain with activity and weight bearing, pain that interferes with activities of daily living, pain with passive range of motion, crepitus, and joint swelling.  Patient has evidence of subchondral sclerosis, periarticular osteophytes, and joint space narrowing by imaging studies. There is no active infection.  Patient Active Problem List   Diagnosis Date Noted   Unilateral primary osteoarthritis, left knee 03/31/2022   Osteoporosis    Mitral valve prolapse    Osteopenia 05/07/2013   Routine general medical examination at a health care facility 04/30/2013   Right knee pain 05/08/2012   Right hip pain 05/08/2012   Knee pain, bilateral 06/03/2011   WEIGHT GAIN 06/28/2010   Past Medical History:  Diagnosis Date   Mitral valve prolapse    mild to moderate regurgitation   Osteoporosis     Past Surgical History:  Procedure Laterality Date   BREAST EXCISIONAL BIOPSY Right    20 + yrs ago, benign   BREAST SURGERY     COLONOSCOPY     WISDOM TOOTH EXTRACTION      Current Facility-Administered Medications  Medication Dose Route Frequency Provider Last Rate Last Admin   denosumab (PROLIA) injection 60 mg  60 mg  Subcutaneous Q6 months Pickard, Warren T, MD   60 mg at 07/20/21 1115   Current Outpatient Medications  Medication Sig Dispense Refill Last Dose   Calcium Carbonate-Vitamin D (CALCIUM 600+D PO) Take 1 tablet by mouth 2 (two) times daily.      Cyanocobalamin (B-12) 5000 MCG CAPS Take 5,000 mcg by mouth daily.      denosumab (PROLIA) 60 MG/ML SOSY injection Inject 60 mg into the skin every 6 (six) months.      famotidine-calcium carbonate-magnesium hydroxide (PEPCID COMPLETE) 10-800-165 MG chewable tablet Chew 1 tablet by mouth daily as needed (heartburn).      loratadine (CLARITIN) 10 MG tablet Take 10 mg by mouth at bedtime as needed for allergies.      Melatonin 10 MG TABS Take 10 mg by mouth at bedtime as needed (for sleep).      meloxicam (MOBIC) 7.5 MG tablet TAKE 1 TABLET BY MOUTH EVERY MORNING AND EVERY NIGHT AT BEDTIME 60 tablet 1    Multiple Vitamin (MULTIVITAMIN) tablet Take 1 tablet by mouth daily.      Omega-3 Fatty Acids (FISH OIL PO) Take 900 mg by mouth daily.      Allergies  Allergen Reactions   Fosamax [Alendronate]     Mouth sores    Penicillins Other (See Comments)    "doesn't work well with me" per patient.    Social History   Tobacco Use   Smoking status: Former    Types: Cigarettes    Quit date: 09/05/2005    Years since quitting: 16.5   Smokeless tobacco: Never  Substance Use Topics   Alcohol use: Yes    Alcohol/week: 3.0 standard drinks of alcohol    Types: 1 Glasses of wine, 2 Cans of beer per week    Comment:  2 beers/ 1 glass of wine on weekends    Family History  Problem Relation Age of Onset   Stroke Mother    Cancer Father        hogkins   Colon cancer Maternal Grandfather 45   Cancer Maternal Grandfather        colon   Hyperlipidemia Other    Cancer Paternal Grandmother        colon   Heart attack Neg Hx    Diabetes Neg Hx    Hypertension Neg Hx    Sudden death Neg Hx      Review of Systems  Musculoskeletal:  Positive for gait problem  and joint swelling.  All other systems reviewed and are negative.   Objective:  Physical Exam Vitals reviewed.  Constitutional:      Appearance: Normal appearance.  HENT:     Head: Normocephalic and atraumatic.  Eyes:     Extraocular Movements: Extraocular movements intact.     Pupils: Pupils are equal, round, and reactive to light.  Cardiovascular:     Rate and Rhythm: Normal rate.  Pulmonary:     Effort: Pulmonary effort is normal.     Breath sounds: Normal breath sounds.  Abdominal:     Palpations: Abdomen is soft.  Musculoskeletal:     Cervical back: Normal range of motion and neck supple.     Left knee: Effusion, bony tenderness and crepitus present. Decreased range of motion. Tenderness present over the medial joint line, lateral joint line and patellar tendon. Abnormal alignment.  Neurological:     Mental Status: She is alert and oriented to person, place, and time.  Psychiatric:        Behavior: Behavior normal.     Vital signs in last 24 hours:    Labs:   Estimated body mass index is 23.34 kg/m as calculated from the following:   Height as of 03/21/22: _0  (1.702 m).   Weight as of 03/21/22: 67.6 kg.   Imaging Review Plain radiographs demonstrate severe degenerative joint disease of the left knee(s). The overall alignment issignificant valgus. The bone quality appears to be good for age and reported activity level.      Assessment/Plan:  End stage arthritis, left knee   The patient history, physical examination, clinical judgment of the provider and imaging studies are consistent with end stage degenerative joint disease of the left knee(s) and total knee arthroplasty is deemed medically necessary. The treatment options including medical management, injection therapy arthroscopy and arthroplasty were discussed at length. The risks and benefits of total knee arthroplasty were presented and reviewed. The risks due to aseptic loosening, infection,  stiffness, patella tracking problems, thromboembolic complications and other imponderables were discussed. The patient acknowledged the explanation, agreed to proceed with the plan and consent was signed. Patient is being admitted for inpatient treatment for surgery, pain control, PT, OT, prophylactic antibiotics, VTE prophylaxis, progressive ambulation and ADL's and discharge planning. The patient is planning to be discharged home with home health services

## 2022-04-01 ENCOUNTER — Observation Stay (HOSPITAL_COMMUNITY): Payer: Medicare Other

## 2022-04-01 ENCOUNTER — Encounter (HOSPITAL_COMMUNITY): Payer: Self-pay | Admitting: Orthopaedic Surgery

## 2022-04-01 ENCOUNTER — Ambulatory Visit (HOSPITAL_COMMUNITY): Payer: Medicare Other | Admitting: Anesthesiology

## 2022-04-01 ENCOUNTER — Other Ambulatory Visit: Payer: Self-pay

## 2022-04-01 ENCOUNTER — Observation Stay (HOSPITAL_COMMUNITY)
Admission: RE | Admit: 2022-04-01 | Discharge: 2022-04-03 | Disposition: A | Discharge status: Home Health | Payer: Medicare Other | Source: Ambulatory Visit | Attending: Orthopaedic Surgery | Admitting: Orthopaedic Surgery

## 2022-04-01 ENCOUNTER — Encounter (HOSPITAL_COMMUNITY)
Admission: RE | Disposition: A | Discharge status: Home Health | Payer: Self-pay | Source: Ambulatory Visit | Attending: Orthopaedic Surgery

## 2022-04-01 ENCOUNTER — Ambulatory Visit (HOSPITAL_BASED_OUTPATIENT_CLINIC_OR_DEPARTMENT_OTHER): Payer: Medicare Other | Admitting: Anesthesiology

## 2022-04-01 DIAGNOSIS — Z87891 Personal history of nicotine dependence: Secondary | ICD-10-CM | POA: Insufficient documentation

## 2022-04-01 DIAGNOSIS — M21062 Valgus deformity, not elsewhere classified, left knee: Secondary | ICD-10-CM | POA: Diagnosis not present

## 2022-04-01 DIAGNOSIS — M1712 Unilateral primary osteoarthritis, left knee: Principal | ICD-10-CM

## 2022-04-01 DIAGNOSIS — Z96652 Presence of left artificial knee joint: Secondary | ICD-10-CM

## 2022-04-01 DIAGNOSIS — Z01818 Encounter for other preprocedural examination: Secondary | ICD-10-CM

## 2022-04-01 DIAGNOSIS — I341 Nonrheumatic mitral (valve) prolapse: Secondary | ICD-10-CM

## 2022-04-01 HISTORY — PX: TOTAL KNEE ARTHROPLASTY: SHX125

## 2022-04-01 LAB — TYPE AND SCREEN
ABO/RH(D): AB POS
Antibody Screen: NEGATIVE

## 2022-04-01 LAB — ABO/RH: ABO/RH(D): AB POS

## 2022-04-01 SURGERY — ARTHROPLASTY, KNEE, TOTAL
Anesthesia: Monitor Anesthesia Care | Site: Knee | Laterality: Left

## 2022-04-01 MED ORDER — POVIDONE-IODINE 10 % EX SWAB
2.0000 | Freq: Once | CUTANEOUS | Status: AC
  Administered 2022-04-01: 2 via TOPICAL

## 2022-04-01 MED ORDER — ONDANSETRON HCL 4 MG/2ML IJ SOLN: 4.0000 mg | Freq: Once | INTRAMUSCULAR | Status: DC | PRN

## 2022-04-01 MED ORDER — STERILE WATER FOR IRRIGATION IR SOLN
Status: DC | PRN
  Administered 2022-04-01: 2000 mL

## 2022-04-01 MED ORDER — DOCUSATE SODIUM 100 MG PO CAPS
100.0000 mg | ORAL_CAPSULE | Freq: Two times a day (BID) | ORAL | Status: DC
  Administered 2022-04-01 – 2022-04-03 (×4): 100 mg via ORAL
  Filled 2022-04-01 (×4): qty 1

## 2022-04-01 MED ORDER — MENTHOL 3 MG MT LOZG: 1.0000 | LOZENGE | OROMUCOSAL | Status: DC | PRN

## 2022-04-01 MED ORDER — HYDROMORPHONE HCL 1 MG/ML IJ SOLN: 0.2500 mg | INTRAMUSCULAR | Status: DC | PRN

## 2022-04-01 MED ORDER — OXYCODONE HCL 5 MG PO TABS: 5.0000 mg | ORAL_TABLET | Freq: Once | ORAL | Status: DC | PRN

## 2022-04-01 MED ORDER — ONDANSETRON HCL 4 MG/2ML IJ SOLN: 4.0000 mg | Freq: Four times a day (QID) | INTRAMUSCULAR | Status: DC | PRN

## 2022-04-01 MED ORDER — LACTATED RINGERS IV SOLN: INTRAVENOUS | Status: DC

## 2022-04-01 MED ORDER — TRANEXAMIC ACID-NACL 1000-0.7 MG/100ML-% IV SOLN
1000.0000 mg | INTRAVENOUS | Status: AC
  Administered 2022-04-01: 1000 mg via INTRAVENOUS
  Filled 2022-04-01: qty 100

## 2022-04-01 MED ORDER — MIDAZOLAM HCL 2 MG/2ML IJ SOLN
INTRAMUSCULAR | Status: AC
  Administered 2022-04-01: 2 mg via INTRAVENOUS
  Filled 2022-04-01: qty 2

## 2022-04-01 MED ORDER — FENTANYL CITRATE PF 50 MCG/ML IJ SOSY
PREFILLED_SYRINGE | INTRAMUSCULAR | Status: AC
  Administered 2022-04-01: 50 ug via INTRAVENOUS
  Filled 2022-04-01: qty 2

## 2022-04-01 MED ORDER — SODIUM CHLORIDE 0.9 % IR SOLN
Status: DC | PRN
  Administered 2022-04-01: 1000 mL

## 2022-04-01 MED ORDER — BUPIVACAINE-EPINEPHRINE (PF) 0.25% -1:200000 IJ SOLN
INTRAMUSCULAR | Status: AC
  Filled 2022-04-01: qty 30

## 2022-04-01 MED ORDER — FENTANYL CITRATE PF 50 MCG/ML IJ SOSY: 50.0000 ug | PREFILLED_SYRINGE | Freq: Once | INTRAMUSCULAR | Status: AC

## 2022-04-01 MED ORDER — PHENYLEPHRINE 80 MCG/ML (10ML) SYRINGE FOR IV PUSH (FOR BLOOD PRESSURE SUPPORT)
PREFILLED_SYRINGE | INTRAVENOUS | Status: AC
  Filled 2022-04-01: qty 10

## 2022-04-01 MED ORDER — METHOCARBAMOL 500 MG PO TABS
500.0000 mg | ORAL_TABLET | Freq: Four times a day (QID) | ORAL | Status: DC | PRN
  Administered 2022-04-01 – 2022-04-02 (×2): 500 mg via ORAL
  Filled 2022-04-01 (×3): qty 1

## 2022-04-01 MED ORDER — KETOROLAC TROMETHAMINE 30 MG/ML IJ SOLN: 15.0000 mg | Freq: Once | INTRAMUSCULAR | Status: DC | PRN

## 2022-04-01 MED ORDER — MIDAZOLAM HCL 5 MG/5ML IJ SOLN
INTRAMUSCULAR | Status: DC | PRN
  Administered 2022-04-01: 2 mg via INTRAVENOUS

## 2022-04-01 MED ORDER — METOCLOPRAMIDE HCL 5 MG PO TABS: 5.0000 mg | ORAL_TABLET | Freq: Three times a day (TID) | ORAL | Status: DC | PRN

## 2022-04-01 MED ORDER — DEXAMETHASONE SODIUM PHOSPHATE 10 MG/ML IJ SOLN
INTRAMUSCULAR | Status: DC | PRN
  Administered 2022-04-01: 8 mg
  Administered 2022-04-01: 10 mg

## 2022-04-01 MED ORDER — HYDROMORPHONE HCL 2 MG PO TABS
2.0000 mg | ORAL_TABLET | ORAL | Status: DC | PRN
  Administered 2022-04-01 – 2022-04-02 (×3): 2 mg via ORAL
  Administered 2022-04-02: 3 mg via ORAL
  Administered 2022-04-02: 2 mg via ORAL
  Filled 2022-04-01: qty 1
  Filled 2022-04-01: qty 2
  Filled 2022-04-01 (×4): qty 1

## 2022-04-01 MED ORDER — DIPHENHYDRAMINE HCL 12.5 MG/5ML PO ELIX
12.5000 mg | ORAL_SOLUTION | ORAL | Status: DC | PRN
  Administered 2022-04-02: 25 mg via ORAL
  Filled 2022-04-01: qty 10

## 2022-04-01 MED ORDER — ASPIRIN 81 MG PO CHEW
81.0000 mg | CHEWABLE_TABLET | Freq: Two times a day (BID) | ORAL | Status: DC
  Administered 2022-04-01 – 2022-04-03 (×4): 81 mg via ORAL
  Filled 2022-04-01 (×4): qty 1

## 2022-04-01 MED ORDER — PHENYLEPHRINE HCL (PRESSORS) 10 MG/ML IV SOLN
INTRAVENOUS | Status: AC
  Filled 2022-04-01: qty 1

## 2022-04-01 MED ORDER — ORAL CARE MOUTH RINSE: 15.0000 mL | Freq: Once | OROMUCOSAL | Status: AC

## 2022-04-01 MED ORDER — FENTANYL CITRATE (PF) 100 MCG/2ML IJ SOLN
INTRAMUSCULAR | Status: DC | PRN
  Administered 2022-04-01 (×2): 50 ug via INTRAVENOUS

## 2022-04-01 MED ORDER — METOCLOPRAMIDE HCL 5 MG/ML IJ SOLN: 5.0000 mg | Freq: Three times a day (TID) | INTRAMUSCULAR | Status: DC | PRN

## 2022-04-01 MED ORDER — PROPOFOL 1000 MG/100ML IV EMUL
INTRAVENOUS | Status: AC
  Filled 2022-04-01: qty 100

## 2022-04-01 MED ORDER — 0.9 % SODIUM CHLORIDE (POUR BTL) OPTIME
TOPICAL | Status: DC | PRN
  Administered 2022-04-01: 1000 mL

## 2022-04-01 MED ORDER — OXYCODONE HCL 5 MG PO TABS
5.0000 mg | ORAL_TABLET | ORAL | Status: DC | PRN
  Administered 2022-04-01: 5 mg via ORAL
  Administered 2022-04-02 (×2): 10 mg via ORAL
  Administered 2022-04-02: 5 mg via ORAL
  Administered 2022-04-03 (×3): 10 mg via ORAL
  Filled 2022-04-01: qty 2
  Filled 2022-04-01: qty 1
  Filled 2022-04-01 (×2): qty 2
  Filled 2022-04-01: qty 1
  Filled 2022-04-01 (×2): qty 2

## 2022-04-01 MED ORDER — ACETAMINOPHEN 500 MG PO TABS
1000.0000 mg | ORAL_TABLET | Freq: Once | ORAL | Status: AC
  Administered 2022-04-01: 1000 mg via ORAL
  Filled 2022-04-01: qty 2

## 2022-04-01 MED ORDER — ONDANSETRON HCL 4 MG/2ML IJ SOLN
INTRAMUSCULAR | Status: AC
  Filled 2022-04-01: qty 2

## 2022-04-01 MED ORDER — ACETAMINOPHEN 325 MG PO TABS
325.0000 mg | ORAL_TABLET | Freq: Four times a day (QID) | ORAL | Status: DC | PRN
  Administered 2022-04-02 – 2022-04-03 (×2): 650 mg via ORAL
  Filled 2022-04-01 (×2): qty 2

## 2022-04-01 MED ORDER — HYDROMORPHONE HCL 1 MG/ML IJ SOLN
0.5000 mg | INTRAMUSCULAR | Status: DC | PRN
  Administered 2022-04-01: 1 mg via INTRAVENOUS

## 2022-04-01 MED ORDER — ONDANSETRON HCL 4 MG PO TABS: 4.0000 mg | ORAL_TABLET | Freq: Four times a day (QID) | ORAL | Status: DC | PRN

## 2022-04-01 MED ORDER — AMISULPRIDE (ANTIEMETIC) 5 MG/2ML IV SOLN: 10.0000 mg | Freq: Once | INTRAVENOUS | Status: DC | PRN

## 2022-04-01 MED ORDER — PHENYLEPHRINE HCL-NACL 20-0.9 MG/250ML-% IV SOLN
INTRAVENOUS | Status: DC | PRN
  Administered 2022-04-01: 50 ug/min via INTRAVENOUS

## 2022-04-01 MED ORDER — FENTANYL CITRATE (PF) 100 MCG/2ML IJ SOLN
INTRAMUSCULAR | Status: AC
  Filled 2022-04-01: qty 2

## 2022-04-01 MED ORDER — ROPIVACAINE HCL 5 MG/ML IJ SOLN
INTRAMUSCULAR | Status: DC | PRN
  Administered 2022-04-01: 30 mL via PERINEURAL

## 2022-04-01 MED ORDER — BUPIVACAINE-EPINEPHRINE 0.25% -1:200000 IJ SOLN
INTRAMUSCULAR | Status: DC | PRN
  Administered 2022-04-01: 20 mL

## 2022-04-01 MED ORDER — MIDAZOLAM HCL 2 MG/2ML IJ SOLN
INTRAMUSCULAR | Status: AC
  Filled 2022-04-01: qty 2

## 2022-04-01 MED ORDER — PHENOL 1.4 % MT LIQD: 1.0000 | OROMUCOSAL | Status: DC | PRN

## 2022-04-01 MED ORDER — PHENYLEPHRINE 80 MCG/ML (10ML) SYRINGE FOR IV PUSH (FOR BLOOD PRESSURE SUPPORT)
PREFILLED_SYRINGE | INTRAVENOUS | Status: DC | PRN
  Administered 2022-04-01 (×5): 80 ug via INTRAVENOUS

## 2022-04-01 MED ORDER — PROPOFOL 500 MG/50ML IV EMUL
INTRAVENOUS | Status: DC | PRN
  Administered 2022-04-01: 20 mg via INTRAVENOUS
  Administered 2022-04-01: 30 mg via INTRAVENOUS
  Administered 2022-04-01: 100 ug/kg/min via INTRAVENOUS
  Administered 2022-04-01: 20 mg via INTRAVENOUS
  Administered 2022-04-01 (×2): 30 mg via INTRAVENOUS

## 2022-04-01 MED ORDER — ALUM & MAG HYDROXIDE-SIMETH 200-200-20 MG/5ML PO SUSP: 30.0000 mL | ORAL | Status: DC | PRN

## 2022-04-01 MED ORDER — CEFAZOLIN SODIUM-DEXTROSE 1-4 GM/50ML-% IV SOLN
1.0000 g | Freq: Four times a day (QID) | INTRAVENOUS | Status: AC
  Administered 2022-04-01 (×2): 1 g via INTRAVENOUS
  Filled 2022-04-01 (×2): qty 50

## 2022-04-01 MED ORDER — CHLORHEXIDINE GLUCONATE 0.12 % MT SOLN
15.0000 mL | Freq: Once | OROMUCOSAL | Status: AC
  Administered 2022-04-01: 15 mL via OROMUCOSAL

## 2022-04-01 MED ORDER — MIDAZOLAM HCL 2 MG/2ML IJ SOLN: 2.0000 mg | Freq: Once | INTRAMUSCULAR | Status: AC

## 2022-04-01 MED ORDER — PANTOPRAZOLE SODIUM 40 MG PO TBEC
40.0000 mg | DELAYED_RELEASE_TABLET | Freq: Every day | ORAL | Status: DC
  Administered 2022-04-01 – 2022-04-03 (×3): 40 mg via ORAL
  Filled 2022-04-01 (×3): qty 1

## 2022-04-01 MED ORDER — SODIUM CHLORIDE 0.9 % IV SOLN: INTRAVENOUS | Status: DC

## 2022-04-01 MED ORDER — OXYCODONE HCL 5 MG/5ML PO SOLN: 5.0000 mg | Freq: Once | ORAL | Status: DC | PRN

## 2022-04-01 MED ORDER — DEXAMETHASONE SODIUM PHOSPHATE 10 MG/ML IJ SOLN
INTRAMUSCULAR | Status: AC
  Filled 2022-04-01: qty 1

## 2022-04-01 MED ORDER — METHOCARBAMOL 1000 MG/10ML IJ SOLN: 500.0000 mg | Freq: Four times a day (QID) | INTRAVENOUS | Status: DC | PRN

## 2022-04-01 MED ORDER — CEFAZOLIN SODIUM-DEXTROSE 2-4 GM/100ML-% IV SOLN
2.0000 g | INTRAVENOUS | Status: AC
  Administered 2022-04-01: 2 g via INTRAVENOUS
  Filled 2022-04-01: qty 100

## 2022-04-01 SURGICAL SUPPLY — 68 items
ARTISURF 14M VE L 6-9 EF KNEE (Knees) ×1 IMPLANT
BAG COUNTER SPONGE SURGICOUNT (BAG) ×1 IMPLANT
BAG ZIPLOCK 12X15 (MISCELLANEOUS) ×2 IMPLANT
BENZOIN TINCTURE PRP APPL 2/3 (GAUZE/BANDAGES/DRESSINGS) IMPLANT
BLADE SAG 18X100X1.27 (BLADE) ×2 IMPLANT
BLADE SAW SGTL 11.0X1.19X90.0M (BLADE) ×1 IMPLANT
BLADE SURG SZ10 CARB STEEL (BLADE) ×4 IMPLANT
BNDG ELASTIC 6X5.8 VLCR STR LF (GAUZE/BANDAGES/DRESSINGS) ×3 IMPLANT
BOWL SMART MIX CTS (DISPOSABLE) ×1 IMPLANT
CEMENT BONE R 1X40 (Cement) ×2 IMPLANT
COOLER ICEMAN CLASSIC (MISCELLANEOUS) ×2 IMPLANT
COVER SURGICAL LIGHT HANDLE (MISCELLANEOUS) ×2 IMPLANT
CUFF TOURN SGL QUICK 34 (TOURNIQUET CUFF) ×1
CUFF TRNQT CYL 34X4.125X (TOURNIQUET CUFF) ×1 IMPLANT
DRAPE INCISE IOBAN 66X45 STRL (DRAPES) ×2 IMPLANT
DRAPE U-SHAPE 47X51 STRL (DRAPES) ×2 IMPLANT
DRSG PAD ABDOMINAL 8X10 ST (GAUZE/BANDAGES/DRESSINGS) ×4 IMPLANT
DURAPREP 26ML APPLICATOR (WOUND CARE) ×2 IMPLANT
ELECT BLADE TIP CTD 4 INCH (ELECTRODE) ×2 IMPLANT
ELECT REM PT RETURN 15FT ADLT (MISCELLANEOUS) ×2 IMPLANT
FEMUR  CMT CCR STD SZ8 L KNEE (Knees) ×1 IMPLANT
FEMUR CMT CCR STD SZ8 L KNEE (Knees) ×1 IMPLANT
FEMUR CMTD CCR STD SZ8 L KNEE (Knees) IMPLANT
GAUZE PAD ABD 7.5X8 STRL (GAUZE/BANDAGES/DRESSINGS) ×1 IMPLANT
GAUZE SPONGE 4X4 12PLY STRL (GAUZE/BANDAGES/DRESSINGS) ×2 IMPLANT
GAUZE XEROFORM 1X8 LF (GAUZE/BANDAGES/DRESSINGS) ×1 IMPLANT
GLOVE BIO SURGEON STRL SZ7.5 (GLOVE) ×3 IMPLANT
GLOVE BIO SURGEON STRL SZ8 (GLOVE) ×3 IMPLANT
GLOVE BIOGEL PI IND STRL 8 (GLOVE) ×2 IMPLANT
GLOVE BIOGEL PI IND STRL 8.5 (GLOVE) IMPLANT
GLOVE BIOGEL PI INDICATOR 8 (GLOVE) ×3
GLOVE BIOGEL PI INDICATOR 8.5 (GLOVE) ×1
GLOVE ECLIPSE 8.0 STRL XLNG CF (GLOVE) ×2 IMPLANT
GOWN STRL REUS W/ TWL XL LVL3 (GOWN DISPOSABLE) ×2 IMPLANT
GOWN STRL REUS W/TWL XL LVL3 (GOWN DISPOSABLE) ×2
HANDPIECE INTERPULSE COAX TIP (DISPOSABLE) ×1
HDLS TROCR DRIL PIN KNEE 75 (PIN) ×1
HOLDER FOLEY CATH W/STRAP (MISCELLANEOUS) ×1 IMPLANT
IMMOBILIZER KNEE 20 (SOFTGOODS) ×2
IMMOBILIZER KNEE 20 THIGH 36 (SOFTGOODS) ×1 IMPLANT
KIT TURNOVER KIT A (KITS) ×1 IMPLANT
NS IRRIG 1000ML POUR BTL (IV SOLUTION) ×2 IMPLANT
PACK TOTAL KNEE CUSTOM (KITS) ×2 IMPLANT
PAD COLD SHLDR WRAP-ON (PAD) ×2 IMPLANT
PAD ORTHO SHOULDER 7X19 LRG (SOFTGOODS) ×1 IMPLANT
PADDING CAST COTTON 6X4 STRL (CAST SUPPLIES) ×4 IMPLANT
PADDING CAST SYN 6 (CAST SUPPLIES) ×1
PADDING CAST SYNTHETIC 6X4 NS (CAST SUPPLIES) IMPLANT
PIN DRILL HDLS TROCAR 75 4PK (PIN) IMPLANT
PROTECTOR NERVE ULNAR (MISCELLANEOUS) ×2 IMPLANT
SCREW FEMALE HEX FIX 25X2.5 (ORTHOPEDIC DISPOSABLE SUPPLIES) ×1 IMPLANT
SET HNDPC FAN SPRY TIP SCT (DISPOSABLE) ×1 IMPLANT
SET PAD KNEE POSITIONER (MISCELLANEOUS) ×2 IMPLANT
SPIKE FLUID TRANSFER (MISCELLANEOUS) ×1 IMPLANT
STAPLER VISISTAT 35W (STAPLE) ×1 IMPLANT
STEM POLY PAT PLY 32M KNEE (Knees) ×1 IMPLANT
STEM TIBIA 5 DEG SZ F L KNEE (Knees) IMPLANT
STRIP CLOSURE SKIN 1/2X4 (GAUZE/BANDAGES/DRESSINGS) IMPLANT
SUT MNCRL AB 4-0 PS2 18 (SUTURE) IMPLANT
SUT VIC AB 0 CT1 27 (SUTURE) ×1
SUT VIC AB 0 CT1 27XBRD ANTBC (SUTURE) ×1 IMPLANT
SUT VIC AB 1 CT1 36 (SUTURE) ×4 IMPLANT
SUT VIC AB 2-0 CT1 27 (SUTURE) ×2
SUT VIC AB 2-0 CT1 TAPERPNT 27 (SUTURE) ×2 IMPLANT
TIBIA STEM 5 DEG SZ F L KNEE (Knees) ×2 IMPLANT
TRAY FOLEY W/BAG SLVR 14FR LF (SET/KITS/TRAYS/PACK) ×1 IMPLANT
TUBE SUCTION HIGH CAP CLEAR NV (SUCTIONS) ×1 IMPLANT
WATER STERILE IRR 1000ML POUR (IV SOLUTION) ×4 IMPLANT

## 2022-04-01 NOTE — Evaluation (Signed)
Physical Therapy Evaluation Patient Details Name: Tammy Mills MRN: [id number] DOB: 12-17-1955 Today's Date: 04/01/2022  History of Present Illness  Pt is a 66yo female presenting s/p L-TKA on 04/01/22. PMH: OP, Mitral valve prolapse  Clinical Impression  Tammy Mills is a 66 y.o. female POD 0 s/p L-TKA. Patient reports independence with mobility at baseline. Patient is now limited by functional impairments (see PT problem list below) and demonstrated modified independence for bed mobility and min guard for transfers. Patient was able to ambulate 15 feet with RW and min guard level of assist. Patient instructed in exercise to facilitate ROM and circulation to manage edema. Provided incentive spirometer and with Vcs pt able to achieve 782m. Patient will benefit from continued skilled PT interventions to address impairments and progress towards PLOF. Acute PT will follow to progress mobility and stair training in preparation for safe discharge home.       Recommendations for follow up therapy are one component of a multi-disciplinary discharge planning process, led by the attending physician.  Recommendations may be updated based on patient status, additional functional criteria and insurance authorization.  Follow Up Recommendations Follow physician's recommendations for discharge plan and follow up therapies      Assistance Recommended at Discharge Set up Supervision/Assistance  Patient can return home with the following  A little help with walking and/or transfers;A little help with bathing/dressing/bathroom;Assistance with cooking/housework;Assist for transportation;Help with stairs or ramp for entrance    Equipment Recommendations None recommended by PT  Recommendations for Other Services       Functional Status Assessment Patient has had a recent decline in their functional status and demonstrates the ability to make significant improvements in function in a reasonable and predictable amount  of time.     Precautions / Restrictions Precautions Precautions: Fall Required Braces or Orthoses: Knee Immobilizer - Left Knee Immobilizer - Left: On when out of bed or walking Restrictions Weight Bearing Restrictions: No Other Position/Activity Restrictions: WBAT      Mobility  Bed Mobility Overal bed mobility: Modified Independent             General bed mobility comments: increased time    Transfers Overall transfer level: Needs assistance Equipment used: Rolling walker (2 wheels) Transfers: Sit to/from Stand Sit to Stand: Min guard, From elevated surface           General transfer comment: For safety only, VCs for sequencing    Ambulation/Gait Ambulation/Gait assistance: Min guard, +2 safety/equipment Gait Distance (Feet): 15 Feet Assistive device: Rolling walker (2 wheels) Gait Pattern/deviations: Step-to pattern Gait velocity: decreased     General Gait Details: Pt ambulated with RW 169fand min guard, no physical assist required or overt LOB noted. +2 for recliner follow  Stairs            Wheelchair Mobility    Modified Rankin (Stroke Patients Only)       Balance Overall balance assessment: Needs assistance Sitting-balance support: Feet supported, No upper extremity supported Sitting balance-[name]: Good     Standing balance support: Reliant on assistive device for balance, During functional activity, Bilateral upper extremity supported Standing balance-[name]: Poor                               Pertinent Vitals/Pain Pain Assessment Pain Assessment: 0-10 Pain Score: 5  Pain Location: L knee Pain Descriptors / Indicators: Operative site guarding Pain Intervention(s): Limited activity within patient's tolerance,  Monitored during session, Repositioned, Ice applied    Home Living Family/patient expects to be discharged to:: Private residence Living Arrangements: Other relatives Available Help at Discharge:  Family;Available 24 hours/day Type of Home: House Home Access: Stairs to enter Entrance Stairs-Rails: None Entrance Stairs-Number of Steps: 4 (Garage entry: 1 "big step" no railing) Alternate Level Stairs-Number of Steps: 14 Home Layout: Multi-level Home Equipment: Shower seat - built in;Grab bars - Statistician (2 wheels);BSC/3in1      Prior Function Prior Level of Function : Independent/Modified Independent;Driving             Mobility Comments: ind ADLs Comments: ind     Hand Dominance        Extremity/Trunk Assessment   Upper Extremity Assessment Upper Extremity Assessment: Overall WFL for tasks assessed    Lower Extremity Assessment Lower Extremity Assessment: RLE deficits/detail;LLE deficits/detail RLE Deficits / Details: MMT ank DF/PF 5/5 RLE Sensation: WNL LLE Deficits / Details: MMT ank DF/PF 5/5, No extensor lag ntoed LLE Sensation: WNL    Cervical / Trunk Assessment Cervical / Trunk Assessment: Normal  Communication   Communication: No difficulties  Cognition Arousal/Alertness: Awake/alert Behavior During Therapy: WFL for tasks assessed/performed Overall Cognitive Status: Within Functional Limits for tasks assessed                                          General Comments General comments (skin integrity, edema, etc.): Sister Maudie Mercury present for session    Exercises Total Joint Exercises Ankle Circles/Pumps: AROM, Both, 10 reps   Assessment/Plan    PT Assessment Patient needs continued PT services  PT Problem List Decreased strength;Decreased range of motion;Decreased activity tolerance;Decreased balance;Decreased mobility;Decreased coordination;Pain       PT Treatment Interventions DME instruction;Gait training;Stair training;Functional mobility training;Therapeutic activities;Therapeutic exercise;Balance training;Neuromuscular re-education;Patient/family education    PT Goals (Current goals can be found in the  Care Plan section)  Acute Rehab PT Goals Patient Stated Goal: Walk more than a mile PT Goal Formulation: With patient Time For Goal Achievement: 04/08/22 Potential to Achieve Goals: Good    Frequency 7X/week     Co-evaluation               AM-PAC PT "6 Clicks" Mobility  Outcome Measure Help needed turning from your back to your side while in a flat bed without using bedrails?: None Help needed moving from lying on your back to sitting on the side of a flat bed without using bedrails?: None Help needed moving to and from a bed to a chair (including a wheelchair)?: A Little Help needed standing up from a chair using your arms (e.g., wheelchair or bedside chair)?: A Little Help needed to walk in hospital room?: A Little Help needed climbing 3-5 steps with a railing? : A Little 6 Click Score: 20    End of Session Equipment Utilized During Treatment: Gait belt;Left knee immobilizer Activity Tolerance: Patient tolerated treatment well;No increased pain Patient left: in chair;with call bell/phone within reach;with chair alarm set;with family/visitor present;with SCD's reapplied Nurse Communication: Mobility status PT Visit Diagnosis: Pain;Difficulty in walking, not elsewhere classified (R26.2) Pain - Right/Left: Left Pain - part of body: Knee    Time: 6190-1222 PT Time Calculation (min) (ACUTE ONLY): 26 min   Charges:   PT Evaluation $PT Eval Low Complexity: 1 Low PT Treatments $Gait Training: 8-22 mins  Coolidge Breeze, PT, DPT [facility] Office: [phone] Pager: [phone]  Coolidge Breeze 04/01/2022, 7:03 PM

## 2022-04-01 NOTE — Interval H&P Note (Signed)
History and Physical Interval Note: The patient understands that she is here today for a left knee replacement to treat her severe left knee osteoarthritis.  There has been no acute or interval change in her medical status.  See recent H&P.  The risks and benefits of surgery been explained in detail and informed consent is obtained.  The left operative knee has been marked.  04/01/2022 9:46 AM  Tammy Mills  has presented today for surgery, with the diagnosis of ENDSTAGE OSTEOARTHRITIS LEFT KNEE.  The various methods of treatment have been discussed with the patient and family. After consideration of risks, benefits and other options for treatment, the patient has consented to  Procedure(s): LEFT TOTAL KNEE ARTHROPLASTY (Left) as a surgical intervention.  The patient's history has been reviewed, patient examined, no change in status, stable for surgery.  I have reviewed the patient's chart and labs.  Questions were answered to the patient's satisfaction.     Mcarthur Rossetti

## 2022-04-01 NOTE — Anesthesia Preprocedure Evaluation (Addendum)
Anesthesia Evaluation  Patient identified by MRN, date of birth, ID band Patient awake    Reviewed: Allergy & Precautions, NPO status , Patient's Chart, lab work & pertinent test results  Airway Mallampati: III  TM Distance: >3 FB Neck ROM: Full    Dental  (+) Teeth Intact, Dental Advisory Given   Pulmonary former smoker,  Quit smoking 2007 Snores at night, has never sleep study    Pulmonary exam normal breath sounds clear to auscultation       Cardiovascular Normal cardiovascular exam+ Valvular Problems/Murmurs (mild AI, mild MR w/ MVP) MR, AI and MVP  Rhythm:Regular Rate:Normal  Echo 02/2022 1. Mild basal inferior/inferoseptal hypokinesis. . Left ventricular  ejection fraction, by estimation, is 55%. The left ventricle has normal  function. Left ventricular diastolic parameters were normal.  2. Right ventricular systolic function is normal. The right ventricular  size is normal.  3. MR is centrally directed. . Mild mitral valve regurgitation. There is  mild prolapse of both leaflets of the mitral valve.  4. The aortic valve is tricuspid. Aortic valve regurgitation is mild.  Aortic valve sclerosis/calcification is present, without any evidence of  aortic stenosis.  5. The inferior vena cava is normal in size with greater than 50%  respiratory variability, suggesting right atrial pressure of 3 mmHg.    Neuro/Psych negative neurological ROS  negative psych ROS   GI/Hepatic negative GI ROS, Neg liver ROS,   Endo/Other  negative endocrine ROS  Renal/GU negative Renal ROS  negative genitourinary   Musculoskeletal  (+) Arthritis , Osteoarthritis,    Abdominal   Peds  Hematology negative hematology ROS (+)   Anesthesia Other Findings   Reproductive/Obstetrics negative OB ROS                            Anesthesia Physical Anesthesia Plan  ASA: 2  Anesthesia Plan: Spinal, Regional and  MAC   Post-op Pain Management: Tylenol PO (pre-op) and Regional block*   Induction:   PONV Risk Score and Plan: 3 and Treatment may vary due to age or medical condition, Propofol infusion and TIVA  Airway Management Planned: Natural Airway and Simple Face Mask  Additional Equipment: None  Intra-op Plan:   Post-operative Plan:   Informed Consent: I have reviewed the patients History and Physical, chart, labs and discussed the procedure including the risks, benefits and alternatives for the proposed anesthesia with the patient or authorized representative who has indicated his/her understanding and acceptance.       Plan Discussed with: CRNA  Anesthesia Plan Comments:         Anesthesia Quick Evaluation

## 2022-04-01 NOTE — Anesthesia Procedure Notes (Signed)
Spinal  Patient location during procedure: OR Start time: 04/01/2022 11:08 AM End time: 04/01/2022 11:13 AM Reason for block: surgical anesthesia Staffing Performed: anesthesiologist  Anesthesiologist: Lannie Fields, DO Performed by: Lannie Fields, DO Authorized by: Lannie Fields, DO   Preanesthetic Checklist Completed: patient identified, IV checked, risks and benefits discussed, surgical consent, monitors and equipment checked, pre-op evaluation and timeout performed Spinal Block Patient position: sitting Prep: DuraPrep and site prepped and draped Patient monitoring: cardiac monitor, continuous pulse ox and blood pressure Approach: midline Location: L3-4 Injection technique: single-shot Needle Needle type: Pencan  Needle gauge: 24 G Needle length: 9 cm Assessment Sensory level: T6 Events: CSF return Additional Notes Functioning IV was confirmed and monitors were applied. Sterile prep and drape, including hand hygiene and sterile gloves were used. The patient was positioned and the spine was prepped. The skin was anesthetized with lidocaine.  Free flow of clear CSF was obtained prior to injecting local anesthetic into the CSF.  The spinal needle aspirated freely following injection.  The needle was carefully withdrawn.  The patient tolerated the procedure well.

## 2022-04-01 NOTE — Op Note (Signed)
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 Magnus Ivan, MD Assistant: Rexene Edison, PA-C  Anesthesia: #1 left lower extremity adductor canal block, #2 spinal, #3 local Tourniquet time: 54 minutes Antibiotics: 2 g IV Ancef Blood loss: Less than 100 cc Complications: None  Indications: The patient is well-known to me.  She is a very pleasant and active 66 year old female with debilitating arthritis of both her knees with significant valgus malalignment.  She has tried and failed conservative treatment for over a year now.  Her knees hurt on a daily basis and they are detrimentally affecting her mobility, her quality of life, and her actives daily living.  She wishes to see the total knee arthroplasty on the left side today and I agree with this as well.  We discussed the risk of acute blood loss anemia, nerve vessel injury, fracture, infection, DVT, implant failure.  We talked about the goals being hopefully decrease pain, improve mobility, and improve quality of life.  Procedure description: After informed consent was obtained and appropriate left knee was marked, anesthesia obtained an adductor canal block of the left lower extremity in the holding room.  The patient was then brought to the operating room and set up on the operating table.  Spinal anesthesia was obtained.  She was then laid in supine position on the operating table and a Foley catheter was placed.  A nonsterile tourniquet was placed around her upper left thigh.  Her left thigh, knee, leg, ankle and foot were prepped and draped with DuraPrep and sterile  drapes including a sterile stockinette.  A timeout was called and she was notified as the correct patient and the correct left knee.  An Esmarch was used to wrap the leg and the tourniquet was inflated to 300 mm of pressure.  With the knee extended I then made a direct midline incision over the patella and carried this proximally and distally.  We dissected down the joint and carried out a medial parapatellar arthrotomy finding a very large joint effusion and significant arthritis throughout the knee especially in the lateral compartment.  With the knee in a flexed position we remove remnants of the ACL, PCL, medial and lateral meniscus.  We removed osteophytes from all 3 compartments.  We then made our proximal tibial cut using extramedullary cutting guide setting the slope of the cut at 3 degrees and correctable varus and valgus.  We made this cut to take 2 mm off the low side of the tibia.  We then backed it down to additional millimeters.  We then used an intramedullary cutting guide for distal femoral cut setting this for a left knee with some 5 degrees of external rotation and a 10 mm distal femoral cut.  We did this Without difficulty.  We will need to down to full extension with a 10 mm extension block achieved full extension.  There is actually some slight hyperextension.  We will back to the femoral aspect of the knee with the knee in a flexed position used a femoral sizing guide based off the epicondylar axis.  Based off of this we chose a size 8 femur.  We put a 4-in-1 cutting block for size 8  femur and made anterior posterior cuts but our chamfer cuts.  We then made a femoral box cut.  We then back to the tibia and chose a size F left tibial tray for coverage of the tibial plateau setting the rotation of the tibial tubercle and the femur.  We made a keel punch and drill hole off of this.  With the trial F left tibia we also trialed a left 8 femur.  We went up to a 14 mm PS fixed-bearing trial  polyethylene insert.  I was pleased with range of motion and stability with that insert.  We then made a patella cut and drilled 3 holes for a size 32 patella button.  With all transportation today we put her through several cycles of motion and we are pleased with range of motion and stability.  We then removed all trial components the knee and irrigate the knee with normal saline solution.  We then placed Marcaine with epinephrine around the arthrotomy.  We then mixed our cement with the knee in a flexed position cemented our Biomet/Zimmer persona tibial tray size F for left knee.  We cemented our size 8 left PS femur.  We placed our 14 mm PS polyethylene insert and cemented our 32 mm patella button.  We then put her through several cycles of motion was pleased range of motion and stability.  We held the knee fully extended to compress the cement until the cement hardened.  We then let the tourniquet down and hemostasis was obtained electrocautery.  We closed the arthrotomy with interrupted #1 Ethibond suture followed by #1 Vicryl post tensor fascia directly discussed the deep tissue and 2-0 Vicryl was used to close subcutaneous tissue the skin was closed with staples.  Well-padded sterile dressing was applied.  She was taken recovery in stable addition with all final counts being correct and no complications noted.  Of note go-cart PA-C assisted the entire case from beginning to end.  His assistance was medically necessary and crucial for retracting soft tissues, helping guide implant placement and a layered closure of the wound.

## 2022-04-01 NOTE — Anesthesia Postprocedure Evaluation (Signed)
Anesthesia Post Note  Patient: Arrington Hayner  Procedure(s) Performed: LEFT TOTAL KNEE ARTHROPLASTY (Left: Knee)     Patient location during evaluation: PACU Anesthesia Type: Regional, MAC and Spinal Level of consciousness: awake and alert and oriented Pain management: pain level controlled Vital Signs Assessment: post-procedure vital signs reviewed and stable Respiratory status: spontaneous breathing, nonlabored ventilation and respiratory function stable Cardiovascular status: blood pressure returned to baseline and stable Postop Assessment: no headache, no backache, spinal receding and no apparent nausea or vomiting Anesthetic complications: no   No notable events documented.  Last Vitals:  Vitals:   04/01/22 1430 04/01/22 1446  BP:  133/63  Pulse: 65 65  Resp: 14 18  Temp:  36.5 C  SpO2: 98% 100%    Last Pain:  Vitals:   04/01/22 1446  TempSrc: Oral  PainSc:                  Pervis Hocking

## 2022-04-01 NOTE — Transfer of Care (Signed)
Immediate Anesthesia Transfer of Care Note  Patient: Tammy Mills  Procedure(s) Performed: LEFT TOTAL KNEE ARTHROPLASTY (Left: Knee)  Patient Location: PACU  Anesthesia Type:Spinal  Level of Consciousness: awake  Airway & Oxygen Therapy: Patient Spontanous Breathing  Post-op Assessment: Report given to RN  Post vital signs: stable  Last Vitals:  Vitals Value Taken Time  BP 130/76 04/01/22 1307  Temp    Pulse 63 04/01/22 1308  Resp 20 04/01/22 1308  SpO2 99 % 04/01/22 1308  Vitals shown include unvalidated device data.  Last Pain:  Vitals:   04/01/22 1011  TempSrc:   PainSc: 2          Complications: No notable events documented.

## 2022-04-01 NOTE — TOC Transition Note (Signed)
Transition of Care (TOC) - CM/SW Discharge Note  Patient Details  Name: Tammy Mills MRN: [id number] Date of Birth: 04/06/1956  Transition of Care (TOC) CM/SW Contact:  Megan S Glenn, LCSW Phone Number: 04/01/2022, 3:34 PM  Clinical Narrative: Patient is expected to discharge home after working with PT. CSW spoke with patient to confirm discharge plan and needs. Patient will go home with HHPT, which was prearranged through [facility]. Patient confirmed she does not have a rolling walker and is agreeable to DME referral to [facility]. CSW made walker referral to Danielle with [employer]. [employer] to deliver rolling walker to patient's room. TOC signing off.   Final next level of care: Home w Home Health Services Barriers to Discharge: No Barriers Identified  Patient Goals and CMS Choice Patient states their goals for this hospitalization and ongoing recovery are:: Discharge home with HHPT CMS Medicare.gov Compare Post Acute Care list provided to:: Patient Choice offered to / list presented to : Patient  Discharge Plan and Services        DME Arranged: Walker rolling DME Agency: [facility] Date DME Agency Contacted: 04/01/22 Time DME Agency Contacted: 1532 Representative spoke with at DME Agency: Danielle HH Arranged: PT HH Agency: [facility] Representative spoke with at HH Agency: Prearranged in orthopedist's office  Readmission Risk Interventions     No data to display         

## 2022-04-01 NOTE — Anesthesia Procedure Notes (Signed)
Anesthesia Regional Block: Adductor canal block   Pre-Anesthetic Checklist: , timeout performed,  Correct Patient, Correct Site, Correct Laterality,  Correct Procedure, Correct Position, site marked,  Risks and benefits discussed,  Surgical consent,  Pre-op evaluation,  At surgeon's request and post-op pain management  Laterality: Left  Prep: Maximum Sterile Barrier Precautions used, chloraprep       Needles:  Injection technique: Single-shot  Needle Type: Echogenic Stimulator Needle     Needle Length: 9cm  Needle Gauge: 22     Additional Needles:   Procedures:,,,, ultrasound used (permanent image in chart),,    Narrative:  Start time: 04/01/2022 10:40 AM End time: 04/01/2022 10:45 AM Injection made incrementally with aspirations every 5 mL.  Performed by: Personally  Anesthesiologist: Lannie Fields, DO  Additional Notes: Monitors applied. No increased pain on injection. No increased resistance to injection. Injection made in 5cc increments. Good needle visualization. Patient tolerated procedure well.

## 2022-04-02 DIAGNOSIS — M1712 Unilateral primary osteoarthritis, left knee: Secondary | ICD-10-CM | POA: Diagnosis not present

## 2022-04-02 LAB — CBC
HCT: 30.7 % — ABNORMAL LOW (ref 36.0–46.0)
Hemoglobin: 10.3 g/dL — ABNORMAL LOW (ref 12.0–15.0)
MCH: 31.2 pg (ref 26.0–34.0)
MCHC: 33.6 g/dL (ref 30.0–36.0)
MCV: 93 fL (ref 80.0–100.0)
Platelets: 245 10*3/uL (ref 150–400)
RBC: 3.3 MIL/uL — ABNORMAL LOW (ref 3.87–5.11)
RDW: 14.2 % (ref 11.5–15.5)
WBC: 9.9 10*3/uL (ref 4.0–10.5)
nRBC: 0 % (ref 0.0–0.2)

## 2022-04-02 LAB — BASIC METABOLIC PANEL
Anion gap: 7 (ref 5–15)
BUN: 12 mg/dL (ref 8–23)
CO2: 24 mmol/L (ref 22–32)
Calcium: 8 mg/dL — ABNORMAL LOW (ref 8.9–10.3)
Chloride: 106 mmol/L (ref 98–111)
Creatinine, Ser: 0.73 mg/dL (ref 0.44–1.00)
GFR, Estimated: >60 mL/min (ref >60)
Glucose, Bld: 146 mg/dL — ABNORMAL HIGH (ref 70–99)
Potassium: 3.9 mmol/L (ref 3.5–5.1)
Sodium: 137 mmol/L (ref 135–145)

## 2022-04-02 MED ORDER — OXYCODONE HCL 5 MG PO TABS: 5.0000 mg | ORAL_TABLET | ORAL | 0 refills | Status: DC | PRN

## 2022-04-02 MED ORDER — ASPIRIN 81 MG PO CHEW: 81.0000 mg | CHEWABLE_TABLET | Freq: Two times a day (BID) | ORAL | 0 refills | Status: DC

## 2022-04-02 MED ORDER — TIZANIDINE HCL 4 MG PO TABS: 4.0000 mg | ORAL_TABLET | Freq: Three times a day (TID) | ORAL | 0 refills | Status: DC | PRN

## 2022-04-02 NOTE — Discharge Instructions (Signed)

## 2022-04-02 NOTE — Progress Notes (Signed)
Subjective: 1 Day Post-Op Procedure(s) (LRB): LEFT TOTAL KNEE ARTHROPLASTY (Left) Patient reports pain as moderate.    Objective: Vital signs in last 24 hours: Temp:  [97.4 F (36.3 C)-98.7 F (37.1 C)] 97.6 F (36.4 C) (07/29 0558) Pulse Rate:  [55-72] 69 (07/29 0558) Resp:  [11-20] 17 (07/29 0558) BP: (117-159)/(54-81) 145/73 (07/29 0558) SpO2:  [96 %-100 %] 99 % (07/29 0558) Weight:  [73 kg] 73 kg (07/28 1450)  Intake/Output from previous day: 07/28 0701 - 07/29 0700 In: 2118.3 [P.O.:300; I.V.:1711.7; IV Piggyback:106.6] Out: 2850 [Urine:2800; Blood:50] Intake/Output this shift: No intake/output data recorded.  Recent Labs    04/02/22 0343  HGB 10.3*   Recent Labs    04/02/22 0343  WBC 9.9  RBC 3.30*  HCT 30.7*  PLT 245   Recent Labs    04/02/22 0343  NA 137  K 3.9  CL 106  CO2 24  BUN 12  CREATININE 0.73  GLUCOSE 146*  CALCIUM 8.0*   No results for input(s): "LABPT", "INR" in the last 72 hours.  Sensation intact distally Intact pulses distally Dorsiflexion/Plantar flexion intact Incision: dressing C/D/I No cellulitis present Compartment soft   Assessment/Plan: 1 Day Post-Op Procedure(s) (LRB): LEFT TOTAL KNEE ARTHROPLASTY (Left) Up with therapy Plan for discharge tomorrow Discharge home with home health      Kathryne Hitch 04/02/2022, 9:26 AM

## 2022-04-02 NOTE — Plan of Care (Signed)
  Problem: Education: Goal: Knowledge of General Education information will improve Description Including pain rating scale, medication(s)/side effects and non-pharmacologic comfort measures Outcome: Progressing   Problem: Health Behavior/Discharge Planning: Goal: Ability to manage health-related needs will improve Outcome: Progressing   

## 2022-04-02 NOTE — Progress Notes (Signed)
Physical Therapy Treatment Patient Details Name: Tammy Mills MRN: [id number] DOB: 12-06-1955 Today's Date: 04/02/2022   History of Present Illness Pt is a 66yo female presenting s/p L-TKA on 04/01/22. PMH: OP, Mitral valve prolapse    PT Comments    Progressing with mobility. Moderate pain with activity. Tolerated session well.    Recommendations for follow up therapy are one component of a multi-disciplinary discharge planning process, led by the attending physician.  Recommendations may be updated based on patient status, additional functional criteria and insurance authorization.  Follow Up Recommendations  Follow physician's recommendations for discharge plan and follow up therapies     Assistance Recommended at Discharge PRN  Patient can return home with the following A little help with walking and/or transfers;A little help with bathing/dressing/bathroom;Assistance with cooking/housework;Assist for transportation;Help with stairs or ramp for entrance   Equipment Recommendations  None recommended by PT    Recommendations for Other Services       Precautions / Restrictions Precautions Precautions: Fall Restrictions Weight Bearing Restrictions: No Other Position/Activity Restrictions: WBAT     Mobility  Bed Mobility Overal bed mobility: Modified Independent             General bed mobility comments: increased time    Transfers Overall transfer level: Needs assistance Equipment used: Rolling walker (2 wheels) Transfers: Sit to/from Stand Sit to Stand: Min guard           General transfer comment: For safety only, VCs for sequencing    Ambulation/Gait Ambulation/Gait assistance: Min guard Gait Distance (Feet): 75 Feet Assistive device: Rolling walker (2 wheels) Gait Pattern/deviations: Step-to pattern, Step-through pattern, Decreased stride length       General Gait Details: Progressed to step thru pattern as distance increased. Cues for safety,  technique.   Stairs             Wheelchair Mobility    Modified Rankin (Stroke Patients Only)       Balance Overall balance assessment: Needs assistance   Sitting balance-[name]: Good     Standing balance support: Reliant on assistive device for balance, During functional activity, Bilateral upper extremity supported Standing balance-[name]: Fair                              Cognition Arousal/Alertness: Awake/alert Behavior During Therapy: WFL for tasks assessed/performed Overall Cognitive Status: Within Functional Limits for tasks assessed                                          Exercises Total Joint Exercises Ankle Circles/Pumps: AROM, Both, 10 reps Quad Sets: AROM, Both, 10 reps Hip ABduction/ADduction: AROM, Left, 10 reps Straight Leg Raises: AROM, Left, 10 reps Knee Flexion: AAROM, Left, 10 reps, Seated Goniometric ROM: ~5-65 degrees    General Comments        Pertinent Vitals/Pain Pain Assessment Pain Assessment: 0-10 Pain Score: 7  Pain Location: L knee Pain Descriptors / Indicators: Operative site guarding, Discomfort, Sore, Aching Pain Intervention(s): Monitored during session, Repositioned, Ice applied    Home Living                          Prior Function            PT Goals (current goals can now be found in the care  plan section) Progress towards PT goals: Progressing toward goals    Frequency    7X/week      PT Plan      Co-evaluation              AM-PAC PT "6 Clicks" Mobility   Outcome Measure  Help needed turning from your back to your side while in a flat bed without using bedrails?: None Help needed moving from lying on your back to sitting on the side of a flat bed without using bedrails?: None Help needed moving to and from a bed to a chair (including a wheelchair)?: A Little Help needed standing up from a chair using your arms (e.g., wheelchair or bedside  chair)?: A Little Help needed to walk in hospital room?: A Little Help needed climbing 3-5 steps with a railing? : A Little 6 Click Score: 20    End of Session Equipment Utilized During Treatment: Gait belt Activity Tolerance: Patient tolerated treatment well Patient left: in bed;with call bell/phone within reach;with family/visitor present   PT Visit Diagnosis: Pain;Difficulty in walking, not elsewhere classified (R26.2) Pain - Right/Left: Left Pain - part of body: Knee     Time: 9068-9340 PT Time Calculation (min) (ACUTE ONLY): 25 min  Charges:  $Gait Training: 8-22 mins $Therapeutic Exercise: 8-22 mins                        Doreatha Massed, PT Acute Rehabilitation  Office: [phone] Pager: [phone]

## 2022-04-02 NOTE — Progress Notes (Signed)
Physical Therapy Treatment Patient Details Name: Tammy Mills MRN: [id number] DOB: [birth year] Today's Date: 04/02/2022   History of Present Illness Pt is a 66yo female presenting s/p L-TKA on 04/01/22. PMH: OP, Mitral valve prolapse    PT Comments    Progressing with mobility. Practiced stair step negotiation. Plan is for d/c home on tomorrow.    Recommendations for follow up therapy are one component of a multi-disciplinary discharge planning process, led by the attending physician.  Recommendations may be updated based on patient status, additional functional criteria and insurance authorization.  Follow Up Recommendations  Follow physician's recommendations for discharge plan and follow up therapies     Assistance Recommended at Discharge PRN  Patient can return home with the following A little help with walking and/or transfers;A little help with bathing/dressing/bathroom;Assistance with cooking/housework;Assist for transportation;Help with stairs or ramp for entrance   Equipment Recommendations  None recommended by PT    Recommendations for Other Services       Precautions / Restrictions Precautions Precautions: Fall Restrictions Weight Bearing Restrictions: No Other Position/Activity Restrictions: WBAT     Mobility  Bed Mobility Overal bed mobility: Modified Independent             General bed mobility comments: increased time    Transfers Overall transfer level: Needs assistance Equipment used: Rolling walker (2 wheels) Transfers: Sit to/from Stand Sit to Stand: Min guard           General transfer comment: For safety only, VCs for sequencing    Ambulation/Gait Ambulation/Gait assistance: Min guard Gait Distance (Feet): 85 Feet Assistive device: Rolling walker (2 wheels) Gait Pattern/deviations: Step-through pattern, Decreased stride length       General Gait Details: Min guard for safety.   Stairs Stairs: Yes Stairs assistance: Min  guard Stair Management: Step to pattern, With walker Number of Stairs: 1 General stair comments: Cues for safety, technique, sequence. Min guard for Environmental health practitioner    Modified Rankin (Stroke Patients Only)       Balance Overall balance assessment: Needs assistance   Sitting balance-[name]: Good     Standing balance support: Reliant on assistive device for balance, During functional activity, Bilateral upper extremity supported Standing balance-[name]: Fair                              Cognition Arousal/Alertness: Awake/alert Behavior During Therapy: WFL for tasks assessed/performed Overall Cognitive Status: Within Functional Limits for tasks assessed                                          Exercises Total Joint Exercises Heel Slides: AAROM, Left, 10 reps (pt used gait belt)    General Comments        Pertinent Vitals/Pain Pain Assessment Pain Assessment: 0-10 Pain Score: 7  Pain Location: L knee Pain Descriptors / Indicators: Operative site guarding, Discomfort, Sore, Aching Pain Intervention(s): Monitored during session, Repositioned, Ice applied    Home Living                          Prior Function            PT Goals (current goals can now be found in the care plan section) Progress towards PT goals: Progressing toward goals    Frequency  7X/week      PT Plan Current plan remains appropriate    Co-evaluation              AM-PAC PT "6 Clicks" Mobility   Outcome Measure  Help needed turning from your back to your side while in a flat bed without using bedrails?: None Help needed moving from lying on your back to sitting on the side of a flat bed without using bedrails?: None Help needed moving to and from a bed to a chair (including a wheelchair)?: A Little Help needed standing up from a chair using your arms (e.g., wheelchair or bedside chair)?: A Little Help needed to  walk in hospital room?: A Little Help needed climbing 3-5 steps with a railing? : A Little 6 Click Score: 20    End of Session Equipment Utilized During Treatment: Gait belt Activity Tolerance: Patient tolerated treatment well Patient left: in bed;with call bell/phone within reach;with family/visitor present   PT Visit Diagnosis: Pain;Difficulty in walking, not elsewhere classified (R26.2) Pain - Right/Left: Left Pain - part of body: Knee     Time: 8307-3543 PT Time Calculation (min) (ACUTE ONLY): 15 min  Charges:  $Gait Training: 8-22 mins $Therapeutic Exercise: 8-22 mins                         Doreatha Massed, PT Acute Rehabilitation  Office: [phone] Pager: [phone]

## 2022-04-03 DIAGNOSIS — M1712 Unilateral primary osteoarthritis, left knee: Secondary | ICD-10-CM | POA: Diagnosis not present

## 2022-04-03 NOTE — Progress Notes (Signed)
The patient is alert and oriented and has been seen by her physician. The orders for discharge were written. IV has been removed. Went over discharge instructions with patient and family. She is being discharged via wheelchair with all of her belongings.  

## 2022-04-03 NOTE — Progress Notes (Signed)
     Subjective: 2 Days Post-Op Procedure(s) (LRB): LEFT TOTAL KNEE ARTHROPLASTY (Left) Awake, alert and oriented x 4. Dressing is dry left knee TKR.  Right leg motor is normal.  Patient reports pain as moderate.    Objective:   VITALS:  Temp:  [98.1 F (36.7 C)-98.8 F (37.1 C)] 98.8 F (37.1 C) (07/30 0558) Pulse Rate:  [78-89] 89 (07/30 0558) Resp:  [16-18] 17 (07/30 0558) BP: (120-147)/(65-75) 120/65 (07/30 0558) SpO2:  [98 %-100 %] 98 % (07/30 0558)  Neurologically intact ABD soft Neurovascular intact Sensation intact distally Intact pulses distally Dorsiflexion/Plantar flexion intact Incision: dressing C/D/I and scant drainage   LABS Recent Labs    04/02/22 0343  HGB 10.3*  WBC 9.9  PLT 245   Recent Labs    04/02/22 0343  NA 137  K 3.9  CL 106  CO2 24  BUN 12  CREATININE 0.73  GLUCOSE 146*   No results for input(s): "LABPT", "INR" in the last 72 hours.   Assessment/Plan: 2 Days Post-Op Procedure(s) (LRB): LEFT TOTAL KNEE ARTHROPLASTY (Left)  Advance diet Up with therapy Discharge home with home health  Basil Dess 04/03/2022, 10:54 AM Patient ID: Tammy Mills, female   DOB: [birth year], 66 y.o.   MRN: [id number]

## 2022-04-03 NOTE — Plan of Care (Signed)

## 2022-04-03 NOTE — Progress Notes (Signed)
Patient has a rolling walker delivered to her room to aid in her mobility and progression at home from knee replacement. Patient is now requesting to have a 3-in-1 to help with mobility and progression at home from knee replacement. The 3-in-1 along with the walker will help in her recovery as her weakness and mobility are improving at home.

## 2022-04-03 NOTE — Progress Notes (Signed)
Physical Therapy Treatment Patient Details Name: Tammy Mills MRN: [id number] DOB: February 17, 1956 Today's Date: 04/03/2022   History of Present Illness Pt is a 66yo female presenting s/p L-TKA on 04/01/22. PMH: OP, Mitral valve prolapse    PT Comments    Progressing with mobility. Moderate pain with activity. All education completed. Pt is now requesting a BSC for home-made RN aware.    Recommendations for follow up therapy are one component of a multi-disciplinary discharge planning process, led by the attending physician.  Recommendations may be updated based on patient status, additional functional criteria and insurance authorization.  Follow Up Recommendations  Follow physician's recommendations for discharge plan and follow up therapies     Assistance Recommended at Discharge PRN  Patient can return home with the following A little help with walking and/or transfers;A little help with bathing/dressing/bathroom;Assistance with cooking/housework;Assist for transportation;Help with stairs or ramp for entrance   Equipment Recommendations  None recommended by PT    Recommendations for Other Services       Precautions / Restrictions Precautions Precautions: Fall Restrictions Weight Bearing Restrictions: No Other Position/Activity Restrictions: WBAT     Mobility  Bed Mobility Overal bed mobility: Modified Independent             General bed mobility comments: increased time    Transfers Overall transfer level: Needs assistance Equipment used: Rolling walker (2 wheels) Transfers: Sit to/from Stand Sit to Stand: Supervision           General transfer comment: For safety only, VCs for sequencing    Ambulation/Gait Ambulation/Gait assistance: Supervision Gait Distance (Feet): 150 Feet Assistive device: Rolling walker (2 wheels) Gait Pattern/deviations: Step-through pattern       General Gait Details: Supv for safety.   Stairs Stairs:  (Practiced on  yesterday. Verbally reviewed on today.)           Wheelchair Mobility    Modified Rankin (Stroke Patients Only)       Balance Overall balance assessment: Mild deficits observed, not formally tested                                          Cognition Arousal/Alertness: Awake/alert Behavior During Therapy: WFL for tasks assessed/performed Overall Cognitive Status: Within Functional Limits for tasks assessed                                          Exercises Total Joint Exercises Ankle Circles/Pumps: AROM, Both, 10 reps Quad Sets: AROM, Both, 10 reps Heel Slides: AAROM, Left, 10 reps Hip ABduction/ADduction: Left, 10 reps, AROM Straight Leg Raises: AROM, Left, 10 reps Knee Flexion: AAROM, Left, 10 reps, Seated Goniometric ROM: ~5-65 degrees    General Comments        Pertinent Vitals/Pain Pain Assessment Pain Assessment: 0-10 Pain Score: 6  Pain Location: L knee Pain Descriptors / Indicators: Discomfort, Sore Pain Intervention(s): Monitored during session, Ice applied    Home Living                          Prior Function            PT Goals (current goals can now be found in the care plan section) Progress towards PT goals: Progressing toward goals  Frequency    7X/week      PT Plan Current plan remains appropriate    Co-evaluation              AM-PAC PT "6 Clicks" Mobility   Outcome Measure  Help needed turning from your back to your side while in a flat bed without using bedrails?: None Help needed moving from lying on your back to sitting on the side of a flat bed without using bedrails?: None Help needed moving to and from a bed to a chair (including a wheelchair)?: A Little Help needed standing up from a chair using your arms (e.g., wheelchair or bedside chair)?: A Little Help needed to walk in hospital room?: A Little Help needed climbing 3-5 steps with a railing? : A Little 6 Click  Score: 20    End of Session Equipment Utilized During Treatment: Gait belt Activity Tolerance: Patient tolerated treatment well Patient left: in chair;with call bell/phone within reach   PT Visit Diagnosis: Pain;Difficulty in walking, not elsewhere classified (R26.2) Pain - Right/Left: Left Pain - part of body: Knee     Time: 5573-2202 PT Time Calculation (min) (ACUTE ONLY): 16 min  Charges:  $Gait Training: 8-22 mins                         Doreatha Massed, PT Acute Rehabilitation  Office: [phone] Pager: [phone]

## 2022-04-03 NOTE — TOC Transition Note (Signed)
Transition of Care [facility]) - CM/SW Discharge Note   Patient Details  Name: Tammy Mills MRN: [id number] Date of Birth: 11/13/1955  [facility] of [facility]) CM/SW Contact:  Ross Ludwig, LCSW Phone Number: 04/03/2022, 11:19 AM   Clinical Narrative:     CSW was informed that patient is now requesting a 3 in 1.  CSW contacted [employer] who will work on delivering equipment to patient before she leaves today.  TOC signing off, please reconsult if other social work needs arise.   Final next level of care: [place] Barriers to Discharge: Barriers Resolved   Patient Goals and CMS Choice Patient states their goals for this hospitalization and ongoing recovery are:: To return back home with home health. CMS Medicare.gov Compare Post Acute Care list provided to:: Patient Choice offered to / list presented to : Patient  Discharge Placement                       Discharge Plan and Services                DME Arranged: 3-N-1, Walker rolling DME Agency: [employer] Date DME Agency Contacted: 04/03/22 Time DME Agency Contacted: 1119 Representative spoke with at DME Agency: [place]: PT [place]: [place]     Representative spoke with at [place] in orthopedist's office  Social Determinants of Health (SDOH) Interventions     Readmission Risk Interventions     No data to display

## 2022-04-04 ENCOUNTER — Encounter (HOSPITAL_COMMUNITY): Payer: Self-pay | Admitting: Orthopaedic Surgery

## 2022-04-04 NOTE — Discharge Summary (Signed)
Patient IDMonie Mills MRN: [id number] DOB/AGE: 02-18-1956 66 y.o.  Admit date: 04/01/2022 Discharge date: 04/04/2022  Admission Diagnoses:  Principal Problem:   Unilateral primary osteoarthritis, left knee Active Problems:   Status post total left knee replacement   Discharge Diagnoses:  Same  Past Medical History:  Diagnosis Date   Mitral valve prolapse    mild to moderate regurgitation   Osteoporosis     Surgeries: Procedure(s): LEFT TOTAL KNEE ARTHROPLASTY on 04/01/2022   Consultants:   Discharged Condition: Improved  Hospital Course: Tammy Mills is an 66 y.o. female who was admitted 04/01/2022 for operative treatment ofUnilateral primary osteoarthritis, left knee. Patient has severe unremitting pain that affects sleep, daily activities, and work/hobbies. After pre-op clearance the patient was taken to the operating room on 04/01/2022 and underwent  Procedure(s): LEFT TOTAL KNEE ARTHROPLASTY.    Patient was given perioperative antibiotics:  Anti-infectives (From admission, onward)    Start     Dose/Rate Route Frequency Ordered Stop   04/01/22 1700  ceFAZolin (ANCEF) IVPB 1 g/50 mL premix        1 g 100 mL/hr over 30 Minutes Intravenous Every 6 hours 04/01/22 1452 04/03/22 1724   04/01/22 0945  ceFAZolin (ANCEF) IVPB 2g/100 mL premix        2 g 200 mL/hr over 30 Minutes Intravenous On call to O.R. 04/01/22 0941 04/01/22 1118        Patient was given sequential compression devices, early ambulation, and chemoprophylaxis to prevent DVT.  Patient benefited maximally from hospital stay and there were no complications.    Recent vital signs: Patient Vitals for the past 24 hrs:  BP Temp Pulse SpO2  04/03/22 1320 (!) 143/73 98 F (36.7 C) 95 100 %     Recent laboratory studies:  Recent Labs    04/02/22 0343  WBC 9.9  HGB 10.3*  HCT 30.7*  PLT 245  NA 137  K 3.9  CL 106  CO2 24  BUN 12  CREATININE 0.73  GLUCOSE 146*  CALCIUM 8.0*     Discharge  Medications:   Allergies as of 04/03/2022       Reactions   Fosamax [alendronate]    Mouth sores    Penicillins Other (See Comments)   "doesn't work well with me" per patient.        Medication List     TAKE these medications    aspirin 81 MG chewable tablet Chew 1 tablet (81 mg total) by mouth 2 (two) times daily.   B-12 5000 MCG Caps Take 5,000 mcg by mouth daily.   CALCIUM 600+D PO Take 1 tablet by mouth 2 (two) times daily.   denosumab 60 MG/ML Sosy injection Commonly known as: PROLIA Inject 60 mg into the skin every 6 (six) months.   famotidine-calcium carbonate-magnesium hydroxide 10-800-165 MG chewable tablet Commonly known as: PEPCID COMPLETE Chew 1 tablet by mouth daily as needed (heartburn).   FISH OIL PO Take 900 mg by mouth daily.   loratadine 10 MG tablet Commonly known as: CLARITIN Take 10 mg by mouth at bedtime as needed for allergies.   Melatonin 10 MG Tabs Take 10 mg by mouth at bedtime as needed (for sleep).   meloxicam 7.5 MG tablet Commonly known as: MOBIC TAKE 1 TABLET BY MOUTH EVERY MORNING AND EVERY NIGHT AT BEDTIME   multivitamin tablet Take 1 tablet by mouth daily.   oxyCODONE 5 MG immediate release tablet Commonly known as: Oxy IR/ROXICODONE Take 1-2 tablets (5-10 mg  total) by mouth every 4 (four) hours as needed for moderate pain (pain score 4-6).   tiZANidine 4 MG tablet Commonly known as: ZANAFLEX Take 1 tablet (4 mg total) by mouth every 8 (eight) hours as needed for muscle spasms.        Diagnostic Studies: DG Knee Left Port  Result Date: 04/01/2022 CLINICAL DATA:  Status post left knee arthroplasty EXAM: PORTABLE LEFT KNEE - 1-2 VIEW COMPARISON:  01/24/2022 FINDINGS: There is interval left knee arthroplasty. There are pockets of air in the soft tissues. Skin staples are seen anteriorly. IMPRESSION: Status post left knee arthroplasty. Electronically Signed   By: Elmer Picker M.D.   On: 04/01/2022 13:42     Disposition: Discharge disposition: 01-Home or Self Care       Discharge Instructions     Call MD / Call 911   Complete by: As directed    If you experience chest pain or shortness of breath, CALL 911 and be transported to the hospital emergency room.  If you develope a fever above 101 F, pus (white drainage) or increased drainage or redness at the wound, or calf pain, call your surgeon's office.   Constipation Prevention   Complete by: As directed    Drink plenty of fluids.  Prune juice may be helpful.  You may use a stool softener, such as Colace (over the counter) 100 mg twice a day.  Use MiraLax (over the counter) for constipation as needed.   Diet - low sodium heart healthy   Complete by: As directed    Discharge instructions   Complete by: As directed    [place]   o Remove items at home which could result in a fall. This includes throw rugs or furniture in walking pathways o ICE to the affected joint every three hours while awake for 30 minutes at a time, for at least the first 3-5 days, and then as needed for pain and swelling.  Continue to use ice for pain and swelling. You may notice swelling that will progress down to the foot and ankle.  This is normal after surgery.  Elevate your leg when you are not up walking on it.   o Continue to use the breathing machine you got in the hospital (incentive spirometer) which will help keep your temperature down.  It is common for your temperature to cycle up and down following surgery, especially at night when you are not up moving around and exerting yourself.  The breathing machine keeps your lungs expanded and your temperature down.   DIET:  As you were doing prior to hospitalization, we recommend a well-balanced diet.  DRESSING / WOUND CARE / SHOWERING  Keep the surgical dressing until follow up.  The dressing is water proof, so you can shower without any extra covering.  IF THE DRESSING FALLS OFF or  the wound gets wet inside, change the dressing with sterile gauze.  Please use good hand washing techniques before changing the dressing.  Do not use any lotions or creams on the incision until instructed by your surgeon.    ACTIVITY  o Increase activity slowly as tolerated, but follow the weight bearing instructions below.   o No driving for 6 weeks or until further direction given by your physician.  You cannot drive while taking narcotics.  o No lifting or carrying greater than 10 lbs. until further directed by your surgeon. o Avoid periods of inactivity such as sitting longer than an hour  when not asleep. This helps prevent blood clots.  o You may return to work once you are authorized by your doctor.     WEIGHT BEARING   Weight bearing as tolerated with assist device (walker, cane, etc) as directed, use it as long as suggested by your surgeon or therapist, typically at least 4-6 weeks.   EXERCISES  Results after joint replacement surgery are often greatly improved when you follow the exercise, range of motion and muscle strengthening exercises prescribed by your doctor. Safety measures are also important to protect the joint from further injury. Any time any of these exercises cause you to have increased pain or swelling, decrease what you are doing until you are comfortable again and then slowly increase them. If you have problems or questions, call your caregiver or physical therapist for advice.   Rehabilitation is important following a joint replacement. After just a few days of immobilization, the muscles of the leg can become weakened and shrink (atrophy).  These exercises are designed to build up the tone and strength of the thigh and leg muscles and to improve motion. Often times heat used for twenty to thirty minutes before working out will loosen up your tissues and help with improving the range of motion but do not use heat for the first two weeks following surgery (sometimes  heat can increase post-operative swelling).   These exercises can be done on a training (exercise) mat, on the floor, on a table or on a bed. Use whatever works the best and is most comfortable for you.    Use music or television while you are exercising so that the exercises are a pleasant break in your day. This will make your life better with the exercises acting as a break in your routine that you can look forward to.   Perform all exercises about fifteen times, three times per day or as directed.  You should exercise both the operative leg and the other leg as well.  Exercises include:    Quad Sets - Tighten up the muscle on the front of the thigh (Quad) and hold for 5-10 seconds.    Straight Leg Raises - With your knee straight (if you were given a brace, keep it on), lift the leg to 60 degrees, hold for 3 seconds, and slowly lower the leg.  Perform this exercise against resistance later as your leg gets stronger.   Leg Slides: Lying on your back, slowly slide your foot toward your buttocks, bending your knee up off the floor (only go as far as is comfortable). Then slowly slide your foot back down until your leg is flat on the floor again.   Angel Wings: Lying on your back spread your legs to the side as far apart as you can without causing discomfort.   Hamstring Strength:  Lying on your back, push your heel against the floor with your leg straight by tightening up the muscles of your buttocks.  Repeat, but this time bend your knee to a comfortable angle, and push your heel against the floor.  You may put a pillow under the heel to make it more comfortable if necessary.   A rehabilitation program following joint replacement surgery can speed recovery and prevent re-injury in the future due to weakened muscles. Contact your doctor or a physical therapist for more information on knee rehabilitation.    CONSTIPATION  Constipation is defined medically as fewer than three stools per week and  severe constipation as less  than one stool per week.  Even if you have a regular bowel pattern at home, your normal regimen is likely to be disrupted due to multiple reasons following surgery.  Combination of anesthesia, postoperative narcotics, change in appetite and fluid intake all can affect your bowels.   YOU MUST use at least one of the following options; they are listed in order of increasing strength to get the job done.  They are all available over the counter, and you may need to use some, POSSIBLY even all of these options:    Drink plenty of fluids (prune juice may be helpful) and high fiber foods Colace 100 mg by mouth twice a day  Senokot for constipation as directed and as needed Dulcolax (bisacodyl), take with full glass of water  Miralax (polyethylene glycol) once or twice a day as needed.  If you have tried all these things and are unable to have a bowel movement in the first 3-4 days after surgery call either your surgeon or your primary doctor.    If you experience loose stools or diarrhea, hold the medications until you stool forms back up.  If your symptoms do not get better within 1 week or if they get worse, check with your doctor.  If you experience "the worst abdominal pain ever" or develop nausea or vomiting, please contact the office immediately for further recommendations for treatment.   ITCHING:  If you experience itching with your medications, try taking only a single pain pill, or even half a pain pill at a time.  You can also use Benadryl over the counter for itching or also to help with sleep.   TED HOSE STOCKINGS:  Use stockings on both legs until for at least 2 weeks or as directed by physician office. They may be removed at night for sleeping.  MEDICATIONS:  See your medication summary on the "After Visit Summary" that nursing will review with you.  You may have some home medications which will be placed on hold until you complete the course of blood thinner  medication.  It is important for you to complete the blood thinner medication as prescribed.  PRECAUTIONS:  If you experience chest pain or shortness of breath - call 911 immediately for transfer to the hospital emergency department.   If you develop a fever greater that 101 F, purulent drainage from wound, increased redness or drainage from wound, foul odor from the wound/dressing, or calf pain - CONTACT YOUR SURGEON.                                                   FOLLOW-UP APPOINTMENTS:  If you do not already have a post-op appointment, please call the office for an appointment to be seen by your surgeon.  Guidelines for how soon to be seen are listed in your "After Visit Summary", but are typically between 1-4 weeks after surgery.  OTHER INSTRUCTIONS:   Knee Replacement:  Do not place pillow under knee, focus on keeping the knee straight while resting. CPM instructions: 0-90 degrees, 2 hours in the morning, 2 hours in the afternoon, and 2 hours in the evening. Place foam block, curve side up under heel at all times except when in CPM or when walking.  DO NOT modify, tear, cut, or change the foam block in any way.  POST-OPERATIVE OPIOID  TAPER INSTRUCTIONS:  It is important to wean off of your opioid medication as soon as possible. If you do not need pain medication after your surgery it is ok to stop day one.  Opioids include:  o Codeine, Hydrocodone(Norco, Vicodin), Oxycodone(Percocet, oxycontin) and hydromorphone amongst others.   Long term and even short term use of opiods can cause:  o Increased pain response  o Dependence  o Constipation  o Depression  o Respiratory depression  o And more.   Withdrawal symptoms can include  o Flu like symptoms  o Nausea, vomiting  o And more  Techniques to manage these symptoms  o Hydrate well  o Eat regular healthy meals  o Stay active  o Use relaxation techniques(deep breathing, meditating, yoga)  Do Not substitute Alcohol to help  with tapering  If you have been on opioids for less than two weeks and do not have pain than it is ok to stop all together.   Plan to wean off of opioids  o This plan should start within one week post op of your joint replacement.  o Maintain the same interval or time between taking each dose and first decrease the dose.   o Cut the total daily intake of opioids by one tablet each day  o Next start to increase the time between doses.  o The last dose that should be eliminated is the evening dose.   MAKE SURE YOU:   Understand these instructions.   Get help right away if you are not doing well or get worse.    Thank you for letting us be a part of your medical care team.  It is a privilege we respect greatly.  We hope these instructions will help you stay on track for a fast and full recovery!     Dental Antibiotics:  In most cases prophylactic antibiotics for Dental procdeures after total joint surgery are not necessary.  Exceptions are as follows:  1. History of prior total joint infection  2. Severely immunocompromised (Organ Transplant, cancer chemotherapy, Rheumatoid biologic meds such as [place])  3. Poorly controlled diabetes (A1C &gt; 8.0, blood glucose over 200)  If you have one of these conditions, contact your surgeon for an antibiotic prescription, prior to your dental procedure.   Increase activity slowly as tolerated   Complete by: As directed    Post-operative opioid taper instructions:   Complete by: As directed    POST-OPERATIVE OPIOID TAPER INSTRUCTIONS: It is important to wean off of your opioid medication as soon as possible. If you do not need pain medication after your surgery it is ok to stop day one. Opioids include: Codeine, Hydrocodone(Norco, Vicodin), Oxycodone(Percocet, oxycontin) and hydromorphone amongst others.  Long term and even short term use of opiods can cause: Increased pain response Dependence Constipation Depression Respiratory  depression And more.  Withdrawal symptoms can include Flu like symptoms Nausea, vomiting And more Techniques to manage these symptoms Hydrate well Eat regular healthy meals Stay active Use relaxation techniques(deep breathing, meditating, yoga) Do Not substitute Alcohol to help with tapering If you have been on opioids for less than two weeks and do not have pain than it is ok to stop all together.  Plan to wean off of opioids This plan should start within one week post op of your joint replacement. Maintain the same interval or time between taking each dose and first decrease the dose.  Cut the total daily intake of opioids by one tablet each day Next  start to increase the time between doses. The last dose that should be eliminated is the evening dose.           Follow-up Information     [facility] Follow up.   Specialty: Home Health Services Why: [facility] will provide PT in the home after discharge. Contact information: [place] [address] [phone]         Mcarthur Rossetti, MD Follow up in 2 week(s).   Specialty: Orthopedic Surgery Contact information: [address] [place] [address] [phone]                  Signed: Erskine Emery 04/04/2022, 8:21 AM

## 2022-04-10 ENCOUNTER — Other Ambulatory Visit: Payer: Self-pay | Admitting: Orthopaedic Surgery

## 2022-04-14 ENCOUNTER — Encounter: Payer: Self-pay | Admitting: Physician Assistant

## 2022-04-14 ENCOUNTER — Ambulatory Visit (INDEPENDENT_AMBULATORY_CARE_PROVIDER_SITE_OTHER): Payer: Medicare Other | Admitting: Physician Assistant

## 2022-04-14 DIAGNOSIS — Z96652 Presence of left artificial knee joint: Secondary | ICD-10-CM

## 2022-04-14 MED ORDER — HYDROCODONE-ACETAMINOPHEN 5-325 MG PO TABS: 1.0000 | ORAL_TABLET | Freq: Four times a day (QID) | ORAL | 0 refills | Status: DC | PRN

## 2022-04-14 NOTE — Progress Notes (Signed)
HPI: Tammy Mills returns today status post left total knee arthroplasty 03/24/2022.  She is overall doing well.  No chest pain shortness of breath no fevers or chills.  Physical exam: General well-developed well-nourished female no acute distress. Left knee full extension flexion to 85 degrees.  Surgical incisions healing well no signs of wound dehiscence.  Calf supple nontender.  Impression: Status post left total knee arthroplasty  Plan: Will set her up for physical therapy for her knee for range of motion and strengthening at the [facility] facility on [address].  Scar tissue mobilization encouraged.  She will stay on aspirin for another week and then discontinue as she is on no aspirin.  Questions were encouraged and answered at length.

## 2022-04-15 NOTE — Addendum Note (Signed)
Addended by: Barbette Or on: 04/15/2022 01:35 PM   Modules accepted: Orders

## 2022-04-25 ENCOUNTER — Ambulatory Visit: Payer: Medicare Other | Attending: Orthopaedic Surgery | Admitting: Physical Therapy

## 2022-04-25 DIAGNOSIS — M25562 Pain in left knee: Secondary | ICD-10-CM | POA: Diagnosis present

## 2022-04-25 DIAGNOSIS — M25662 Stiffness of left knee, not elsewhere classified: Secondary | ICD-10-CM | POA: Insufficient documentation

## 2022-04-25 DIAGNOSIS — G8929 Other chronic pain: Secondary | ICD-10-CM | POA: Insufficient documentation

## 2022-04-25 DIAGNOSIS — R600 Localized edema: Secondary | ICD-10-CM | POA: Diagnosis present

## 2022-04-25 DIAGNOSIS — M6281 Muscle weakness (generalized): Secondary | ICD-10-CM | POA: Diagnosis present

## 2022-04-25 DIAGNOSIS — R262 Difficulty in walking, not elsewhere classified: Secondary | ICD-10-CM | POA: Diagnosis present

## 2022-04-25 DIAGNOSIS — Z96652 Presence of left artificial knee joint: Secondary | ICD-10-CM | POA: Insufficient documentation

## 2022-04-25 NOTE — Therapy (Signed)
OUTPATIENT PHYSICAL THERAPY LOWER EXTREMITY EVALUATION   Patient Name: Tammy Mills MRN: [id number] DOB:23-Mar-1956, 66 y.o., female Today's Date: 04/25/2022   PT End of Session - 04/25/22 1104     Visit Number 1    Number of Visits 12    Date for PT Re-Evaluation 06/06/22    Authorization Type Medicare    PT Start Time 1100    PT Stop Time 1145    PT Time Calculation (min) 45 min    Activity Tolerance Patient tolerated treatment well    Behavior During Therapy WFL for tasks assessed/performed             Past Medical History:  Diagnosis Date   Mitral valve prolapse    mild to moderate regurgitation   Osteoporosis    Past Surgical History:  Procedure Laterality Date   BREAST EXCISIONAL BIOPSY Right    20 + yrs ago, benign   BREAST SURGERY     COLONOSCOPY     TOTAL KNEE ARTHROPLASTY Left 04/01/2022   Procedure: LEFT TOTAL KNEE ARTHROPLASTY;  Surgeon: Mcarthur Rossetti, MD;  Location: [facility];  Service: Orthopedics;  Laterality: Left;   WISDOM TOOTH EXTRACTION     Patient Active Problem List   Diagnosis Date Noted   Status post total left knee replacement 04/01/2022   Unilateral primary osteoarthritis, left knee 03/31/2022   Osteoporosis    Mitral valve prolapse    Osteopenia 05/07/2013   Routine general medical examination at a health care facility 04/30/2013   Right knee pain 05/08/2012   Right hip pain 05/08/2012   Knee pain, bilateral 06/03/2011   WEIGHT GAIN 06/28/2010    PCP: Jenna Luo  REFERRING PROVIDER: Pete Pelt, PA-C   REFERRING DIAG: status post left total knee arthroplasty 03/24/2022   THERAPY DIAG:  No diagnosis found.  Rationale for Evaluation and Treatment Rehabilitation  ONSET DATE: 03/24/22  SUBJECTIVE:   SUBJECTIVE STATEMENT: Pt with L TKA on 03/24/22.  Pt has been doing HHPT. Pt was seen 3x/wk. Pt reports exercises have been going well. Pt reports has been having difficulty sleeping (is normally a side sleeper).  Plans to get TKA for R. Pt has floor pedaler, bike and treadmill at home. Pt to follow up with ortho Sept 7.  PERTINENT HISTORY: N/a  PAIN:  Are you having pain? Yes: NPRS scale: 1 at rest, 5 after exercises/10 Pain location: L knee Pain description: Throbbing Aggravating factors: After exercises Relieving factors: Ice  PRECAUTIONS: None  WEIGHT BEARING RESTRICTIONS No  FALLS:  Has patient fallen in last 6 months? No  LIVING ENVIRONMENT: Lives with: lives with their family (2 sisters) Lives in: House/apartment Stairs: Yes: Internal: 14 steps; on right going up Has stair lift Has following equipment at home: Single point cane and Walker - 2 wheeled (uses cane in the house)  OCCUPATION: Retired  PLOF: [place] dog; goal for 3 miles of walking a day   OBJECTIVE:    PATIENT SURVEYS:  FOTO 47; predicted 52  COGNITION:  Overall cognitive status: Within functional limits for tasks assessed     SENSATION: WFL   POSTURE: R knee valgus noted  PALPATION: TTP L medial > lateral knee due to edema  LOWER EXTREMITY ROM:  ROM Right eval Left eval  Hip flexion    Hip extension    Hip abduction    Hip adduction    Hip internal rotation    Hip external rotation    Knee flexion  118 deg AROM  82 deg AROM 87 deg PROM  Knee extension -20 deg  AROM -8 deg PROM -25 deg AROM -7 deg PROM  Ankle dorsiflexion    Ankle plantarflexion    Ankle inversion    Ankle eversion     (Blank rows = not tested)  LOWER EXTREMITY MMT:  MMT Right eval Left eval  Hip flexion 5/5 5/5  Hip extension 4-/5 4-/5  Hip abduction 3+/5 3-/5  Hip adduction    Hip internal rotation    Hip external rotation    Knee flexion 4/5 3+/5  Knee extension 4-/5 4/5  Ankle dorsiflexion    Ankle plantarflexion    Ankle inversion    Ankle eversion     (Blank rows = not tested)    FUNCTIONAL TESTS:  5 times sit to stand: 15 sec  GAIT: Distance walked:  16' Assistive device utilized: Single point cane and Environmental consultant - 2 wheeled Level of assistance: SBA Comments: Performs toe off over little toes into slight ER; good heel strike    TODAY'S TREATMENT: See HEP   PATIENT EDUCATION:  Education details: Exam findings, gait pattern, things to add to current HEP Person educated: Patient Education method: Explanation, Demonstration, and Handouts Education comprehension: verbalized understanding and returned demonstration   HOME EXERCISE PROGRAM: In addition to what HHPT has already given pt Access Code: [email or web address] URL: [email or web address] Date: 04/25/2022 Prepared by: Estill Bamberg April Thurnell Garbe  Exercises - Seated Knee Flexion Stretch  - 1 x daily - 7 x weekly - 3 sets - 30 sec hold - Standing Toe Dorsiflexion Stretch  - 1 x daily - 7 x weekly - 2 sets - 10 reps - Sidelying Hip Abduction  - 1 x daily - 7 x weekly - 2 sets - 10 reps  ASSESSMENT:  CLINICAL IMPRESSION: Patient is a 66 y.o. F who was seen today for physical therapy evaluation and treatment for L TKA (surgery on 03/24/22). Has been seeing HHPT. Exam findings significant for decreased L knee ROM, bilat LE weakness, abnormal gait pattern, and pain affecting functional mobility, t/fs, and amb. Pt would benefit from PT to address these issues to meet her personal goals to amb 3 miles.    OBJECTIVE IMPAIRMENTS Abnormal gait, decreased activity tolerance, decreased endurance, decreased mobility, difficulty walking, decreased ROM, decreased strength, hypomobility, increased edema, increased fascial restrictions, impaired flexibility, improper body mechanics, and pain.   ACTIVITY LIMITATIONS bending, standing, squatting, sleeping, stairs, transfers, toileting, and locomotion level  PARTICIPATION LIMITATIONS: driving, shopping, community activity, and yard work  PERSONAL FACTORS Age, Fitness, and Past/current experiences are also affecting patient's functional outcome.    REHAB POTENTIAL: Good  CLINICAL DECISION MAKING: Stable/uncomplicated  EVALUATION COMPLEXITY: Low   GOALS: Goals reviewed with patient? Yes   LONG TERM GOALS: Target date: 06/06/2022   Pt will be ind with advancing HEP Baseline:  Goal status: INITIAL  2.  Pt will demo 0 to 120 deg L knee ROM for improved stair negotiation Baseline: 8 to 85 deg Goal status: INITIAL  3.  Pt will be able to amb at least 1 mile independently Baseline: Using cane in the house Goal status: INITIAL  4.  Pt will be able to perform 5x STS in </=13 sec to demo increased functional LE strength Baseline: 15 sec Goal status: INITIAL  5.  Pt will have improved FOTO to 76 Baseline: 47 Goal status: INITIAL    PLAN: PT FREQUENCY: 2x/week  PT DURATION: 6 weeks  PLANNED INTERVENTIONS: Therapeutic  exercises, Therapeutic activity, Neuromuscular re-education, Balance training, Gait training, Patient/Family education, Self Care, Joint mobilization, Stair training, Aquatic Therapy, Dry Needling, Electrical stimulation, Cryotherapy, scar mobilization, Taping, Vasopneumatic device, Ionotophoresis 52m/ml Dexamethasone, Manual therapy, and Re-evaluation  PLAN FOR NEXT SESSION: Pt is to bring in her exercises from HHPT -- please review and progress/update. Continue to work on knee ROM and hip/knee strengthening.   Shamarra Warda April Ma L Viveca Beckstrom, PT, DPT 04/25/2022, 11:04 AM

## 2022-04-27 ENCOUNTER — Encounter: Payer: Self-pay | Admitting: Physical Therapy

## 2022-04-27 ENCOUNTER — Ambulatory Visit: Payer: Medicare Other | Admitting: Physical Therapy

## 2022-04-27 DIAGNOSIS — M25562 Pain in left knee: Secondary | ICD-10-CM | POA: Diagnosis not present

## 2022-04-27 DIAGNOSIS — R600 Localized edema: Secondary | ICD-10-CM

## 2022-04-27 DIAGNOSIS — M6281 Muscle weakness (generalized): Secondary | ICD-10-CM

## 2022-04-27 DIAGNOSIS — M25662 Stiffness of left knee, not elsewhere classified: Secondary | ICD-10-CM

## 2022-04-27 DIAGNOSIS — R262 Difficulty in walking, not elsewhere classified: Secondary | ICD-10-CM

## 2022-04-27 DIAGNOSIS — G8929 Other chronic pain: Secondary | ICD-10-CM

## 2022-04-27 NOTE — Therapy (Signed)
OUTPATIENT PHYSICAL THERAPY LOWER EXTREMITY EVALUATION   Patient Name: Tammy Mills MRN: [id number] DOB:1955-11-07, 66 y.o., female Today's Date: 04/27/2022   PT End of Session - 04/27/22 1101     Visit Number 2    Number of Visits 12    Date for PT Re-Evaluation 06/06/22    Authorization Type Medicare    PT Start Time 1100    PT Stop Time 1145    PT Time Calculation (min) 45 min    Activity Tolerance Patient tolerated treatment well    Behavior During Therapy WFL for tasks assessed/performed             Past Medical History:  Diagnosis Date   Mitral valve prolapse    mild to moderate regurgitation   Osteoporosis    Past Surgical History:  Procedure Laterality Date   BREAST EXCISIONAL BIOPSY Right    20 + yrs ago, benign   BREAST SURGERY     COLONOSCOPY     TOTAL KNEE ARTHROPLASTY Left 04/01/2022   Procedure: LEFT TOTAL KNEE ARTHROPLASTY;  Surgeon: Mcarthur Rossetti, MD;  Location: [facility];  Service: Orthopedics;  Laterality: Left;   WISDOM TOOTH EXTRACTION     Patient Active Problem List   Diagnosis Date Noted   Status post total left knee replacement 04/01/2022   Unilateral primary osteoarthritis, left knee 03/31/2022   Osteoporosis    Mitral valve prolapse    Osteopenia 05/07/2013   Routine general medical examination at a health care facility 04/30/2013   Right knee pain 05/08/2012   Right hip pain 05/08/2012   Knee pain, bilateral 06/03/2011   WEIGHT GAIN 06/28/2010    PCP: Jenna Luo  REFERRING PROVIDER: Pete Pelt, PA-C   REFERRING DIAG: status post left total knee arthroplasty 03/24/2022   THERAPY DIAG:  Chronic pain of left knee  Stiffness of left knee, not elsewhere classified  Muscle weakness (generalized)  Localized edema  Difficulty in walking, not elsewhere classified  Rationale for Evaluation and Treatment Rehabilitation  ONSET DATE: 03/24/22  SUBJECTIVE:   SUBJECTIVE STATEMENT: Pt reports she's been trying to  walk with improved gait pattern. Has been doing her exercises twice a day.   PERTINENT HISTORY: N/a  PAIN:  Are you having pain? Yes: NPRS scale: 1 at rest, 5 after exercises/10 Pain location: L knee Pain description: Throbbing Aggravating factors: After exercises Relieving factors: Ice  PRECAUTIONS: None  WEIGHT BEARING RESTRICTIONS No  FALLS:  Has patient fallen in last 6 months? No  LIVING ENVIRONMENT: Lives with: lives with their family (2 sisters) Lives in: House/apartment Stairs: Yes: Internal: 14 steps; on right going up Has stair lift Has following equipment at home: Single point cane and Walker - 2 wheeled (uses cane in the house)  OCCUPATION: Retired  PLOF: [place] dog; goal for 3 miles of walking a day   OBJECTIVE:    PATIENT SURVEYS:  FOTO 47; predicted 9  COGNITION:  Overall cognitive status: Within functional limits for tasks assessed     SENSATION: WFL   POSTURE: R knee valgus noted  PALPATION: TTP L medial > lateral knee due to edema  LOWER EXTREMITY ROM:  ROM Right eval Left eval  Hip flexion    Hip extension    Hip abduction    Hip adduction    Hip internal rotation    Hip external rotation    Knee flexion 118 deg AROM  82 deg AROM 87 deg PROM  Knee extension -20  deg  AROM -8 deg PROM -25 deg AROM -7 deg PROM  Ankle dorsiflexion    Ankle plantarflexion    Ankle inversion    Ankle eversion     (Blank rows = not tested)  LOWER EXTREMITY MMT:  MMT Right eval Left eval  Hip flexion 5/5 5/5  Hip extension 4-/5 4-/5  Hip abduction 3+/5 3-/5  Hip adduction    Hip internal rotation    Hip external rotation    Knee flexion 4/5 3+/5  Knee extension 4-/5 4/5  Ankle dorsiflexion    Ankle plantarflexion    Ankle inversion    Ankle eversion     (Blank rows = not tested)    FUNCTIONAL TESTS:  5 times sit to stand: 15 sec  GAIT: Distance walked: 39' Assistive device utilized: Single point  cane and Environmental consultant - 2 wheeled Level of assistance: SBA Comments: Performs toe off over little toes into slight ER; good heel strike    TODAY'S TREATMENT: 04/27/22 THEREX: Nustep L5 x 5 min LEs/UEs  Seated Knee flexion stretch + scoot forward 3x30 sec  Supine Heel slide with strap for knee flexion 10x5sec Hip flexed with knee bending 2x10 Knee ext with bolster under heel x2 min Quad set with bolster 2x10 Hamstring stretch with strap 2x30 sec  Sidelying Hip abduction 2x10  Standing Heel raise 2x10 Toe off + knee flexion 2x10 Mini squat 2x10  GAIT: Amb 2x90' RW SBA; cues for trunk extension, heel strike and toe off   PATIENT EDUCATION:  Education details: Exam findings, gait pattern, things to add to current HEP Person educated: Patient Education method: Explanation, Demonstration, and Handouts Education comprehension: verbalized understanding and returned demonstration   HOME EXERCISE PROGRAM: In addition to what HHPT has already given pt Access Code: [email or web address] URL: [email or web address] Date: 04/25/2022 Prepared by: Estill Bamberg April Thurnell Garbe  Exercises - Seated Knee Flexion Stretch  - 1 x daily - 7 x weekly - 3 sets - 30 sec hold - Standing Toe Dorsiflexion Stretch  - 1 x daily - 7 x weekly - 2 sets - 10 reps - Sidelying Hip Abduction  - 1 x daily - 7 x weekly - 2 sets - 10 reps  ASSESSMENT:  CLINICAL IMPRESSION: Progressed and updated pt's HEP accordingly. Continued to work on improving knee ROM.    OBJECTIVE IMPAIRMENTS Abnormal gait, decreased activity tolerance, decreased endurance, decreased mobility, difficulty walking, decreased ROM, decreased strength, hypomobility, increased edema, increased fascial restrictions, impaired flexibility, improper body mechanics, and pain.   ACTIVITY LIMITATIONS bending, standing, squatting, sleeping, stairs, transfers, toileting, and locomotion level  PARTICIPATION LIMITATIONS: driving, shopping, community  activity, and yard work  PERSONAL FACTORS Age, Fitness, and Past/current experiences are also affecting patient's functional outcome.   REHAB POTENTIAL: Good  CLINICAL DECISION MAKING: Stable/uncomplicated  EVALUATION COMPLEXITY: Low   GOALS: Goals reviewed with patient? Yes   LONG TERM GOALS: Target date: 06/06/2022   Pt will be ind with advancing HEP Baseline:  Goal status: INITIAL  2.  Pt will demo 0 to 120 deg L knee ROM for improved stair negotiation Baseline: 8 to 85 deg Goal status: INITIAL  3.  Pt will be able to amb at least 1 mile independently Baseline: Using cane in the house Goal status: INITIAL  4.  Pt will be able to perform 5x STS in </=13 sec to demo increased functional LE strength Baseline: 15 sec Goal status: INITIAL  5.  Pt will have improved FOTO to 76 Baseline: 47 Goal  status: INITIAL    PLAN: PT FREQUENCY: 2x/week  PT DURATION: 6 weeks  PLANNED INTERVENTIONS: Therapeutic exercises, Therapeutic activity, Neuromuscular re-education, Balance training, Gait training, Patient/Family education, Self Care, Joint mobilization, Stair training, Aquatic Therapy, Dry Needling, Electrical stimulation, Cryotherapy, scar mobilization, Taping, Vasopneumatic device, Ionotophoresis 70m/ml Dexamethasone, Manual therapy, and Re-evaluation  PLAN FOR NEXT SESSION: Pt is to bring in her exercises from HHPT -- please review and progress/update. Continue to work on knee ROM and hip/knee strengthening.   Dannica Bickham April Ma L Ahnesti Townsend, PT, DPT 04/27/2022, 11:02 AM

## 2022-05-03 ENCOUNTER — Encounter: Payer: Self-pay | Admitting: Physical Therapy

## 2022-05-03 ENCOUNTER — Ambulatory Visit: Payer: Medicare Other | Admitting: Physical Therapy

## 2022-05-03 ENCOUNTER — Other Ambulatory Visit: Payer: Self-pay | Admitting: Physician Assistant

## 2022-05-03 DIAGNOSIS — M25562 Pain in left knee: Secondary | ICD-10-CM | POA: Diagnosis not present

## 2022-05-03 DIAGNOSIS — G8929 Other chronic pain: Secondary | ICD-10-CM

## 2022-05-03 DIAGNOSIS — M6281 Muscle weakness (generalized): Secondary | ICD-10-CM

## 2022-05-03 DIAGNOSIS — R600 Localized edema: Secondary | ICD-10-CM

## 2022-05-03 DIAGNOSIS — R262 Difficulty in walking, not elsewhere classified: Secondary | ICD-10-CM

## 2022-05-03 DIAGNOSIS — M25662 Stiffness of left knee, not elsewhere classified: Secondary | ICD-10-CM

## 2022-05-03 NOTE — Therapy (Signed)
OUTPATIENT PHYSICAL THERAPY   Patient Name: Tammy Mills MRN: [id number] DOB:01-10-1956, 66 y.o., female Today's Date: 05/03/2022   PT End of Session - 05/03/22 1222     Visit Number 3    Number of Visits 12    Date for PT Re-Evaluation 06/06/22    Authorization Type Medicare    Authorization - Visit Number 3    Progress Note Due on Visit 10    PT Start Time 8938    PT Stop Time 1221    PT Time Calculation (min) 46 min    Activity Tolerance Patient tolerated treatment well    Behavior During Therapy WFL for tasks assessed/performed              Past Medical History:  Diagnosis Date   Mitral valve prolapse    mild to moderate regurgitation   Osteoporosis    Past Surgical History:  Procedure Laterality Date   BREAST EXCISIONAL BIOPSY Right    20 + yrs ago, benign   BREAST SURGERY     COLONOSCOPY     TOTAL KNEE ARTHROPLASTY Left 04/01/2022   Procedure: LEFT TOTAL KNEE ARTHROPLASTY;  Surgeon: Mcarthur Rossetti, MD;  Location: [facility];  Service: Orthopedics;  Laterality: Left;   WISDOM TOOTH EXTRACTION     Patient Active Problem List   Diagnosis Date Noted   Status post total left knee replacement 04/01/2022   Unilateral primary osteoarthritis, left knee 03/31/2022   Osteoporosis    Mitral valve prolapse    Osteopenia 05/07/2013   Routine general medical examination at a health care facility 04/30/2013   Right knee pain 05/08/2012   Right hip pain 05/08/2012   Knee pain, bilateral 06/03/2011   WEIGHT GAIN 06/28/2010    PCP: Jenna Luo  REFERRING PROVIDER: Pete Pelt, PA-C   REFERRING DIAG: status post left total knee arthroplasty 03/24/2022   THERAPY DIAG:  Chronic pain of left knee  Stiffness of left knee, not elsewhere classified  Muscle weakness (generalized)  Localized edema  Difficulty in walking, not elsewhere classified  Rationale for Evaluation and Treatment Rehabilitation  ONSET DATE: 03/24/22  SUBJECTIVE:   SUBJECTIVE  STATEMENT: Pt states she is using the [place] to walk at home and has been doing HEP 2x a day  PERTINENT HISTORY: N/a  PAIN:  Are you having pain? No  PRECAUTIONS: None  WEIGHT BEARING RESTRICTIONS No  FALLS:  Has patient fallen in last 6 months? No  LIVING ENVIRONMENT: Lives with: lives with their family (2 sisters) Lives in: House/apartment Stairs: Yes: Internal: 14 steps; on right going up Has stair lift Has following equipment at home: Single point cane and Walker - 2 wheeled (uses cane in the house)  OCCUPATION: Retired  PLOF: [place] dog; goal for 3 miles of walking a day   OBJECTIVE:    PATIENT SURVEYS:  FOTO 47; predicted 75  COGNITION:  Overall cognitive status: Within functional limits for tasks assessed     SENSATION: WFL   POSTURE: R knee valgus noted  PALPATION: TTP L medial > lateral knee due to edema  LOWER EXTREMITY ROM:  ROM Right eval Left eval Left 8/29  Hip flexion     Hip extension     Hip abduction     Hip adduction     Hip internal rotation     Hip external rotation     Knee flexion 118 deg AROM  82 deg AROM 87 deg PROM 80 AROM 90 PROM  Knee extension -20 deg  AROM -8 deg PROM -25 deg AROM -7 deg PROM -6 AROM  Ankle dorsiflexion     Ankle plantarflexion     Ankle inversion     Ankle eversion      (Blank rows = not tested)  LOWER EXTREMITY MMT:  MMT Right eval Left eval  Hip flexion 5/5 5/5  Hip extension 4-/5 4-/5  Hip abduction 3+/5 3-/5  Hip adduction    Hip internal rotation    Hip external rotation    Knee flexion 4/5 3+/5  Knee extension 4-/5 4/5  Ankle dorsiflexion    Ankle plantarflexion    Ankle inversion    Ankle eversion     (Blank rows = not tested)    FUNCTIONAL TESTS:  5 times sit to stand: 15 sec  GAIT: Distance walked: 43' Assistive device utilized: Single point cane and Environmental consultant - 2 wheeled Level of assistance: SBA Comments: Performs toe off over little toes  into slight ER; good heel strike    TODAY'S TREATMENT: 05/03/22 Nustep L5 x 5 mins  Supine: SKTC for knee flexion stretch 2 x 30 sec  Standing: Heel raise x 20 Step ups forward 4'' step 1 UE support x 10 Step ups laterally 4'' step 1 UE support x 10 Knee flexion 2 x 10 Mini squats 2 x 10 Hip abduction yellow TB 2 x 10 Laps around clinic after stretching/PROM  Seated: Heel slide for flexion x 10  Supine: Quad set x 10 3 sec hold SLR x 10 with quad set  Manual: PROM LT knee flexion and extension  04/27/22 THEREX: Nustep L5 x 5 min LEs/UEs  Seated Knee flexion stretch + scoot forward 3x30 sec  Supine Heel slide with strap for knee flexion 10x5sec Hip flexed with knee bending 2x10 Knee ext with bolster under heel x2 min Quad set with bolster 2x10 Hamstring stretch with strap 2x30 sec  Sidelying Hip abduction 2x10  Standing Heel raise 2x10 Toe off + knee flexion 2x10 Mini squat 2x10  GAIT: Amb 2x90' RW SBA; cues for trunk extension, heel strike and toe off   PATIENT EDUCATION:  Education details: Exam findings, gait pattern, things to add to current HEP Person educated: Patient Education method: Explanation, Demonstration, and Handouts Education comprehension: verbalized understanding and returned demonstration   HOME EXERCISE PROGRAM: In addition to what HHPT has already given pt Access Code: [email or web address] URL: [email or web address] Date: 04/25/2022 Prepared by: Estill Bamberg April Thurnell Garbe  Exercises - Seated Knee Flexion Stretch  - 1 x daily - 7 x weekly - 3 sets - 30 sec hold - Standing Toe Dorsiflexion Stretch  - 1 x daily - 7 x weekly - 2 sets - 10 reps - Sidelying Hip Abduction  - 1 x daily - 7 x weekly - 2 sets - 10 reps  ASSESSMENT:  CLINICAL IMPRESSION: Discussed increasing walking/gait endurance and encouraged pt to add short walks to her daily routine. Pt is improving PROM, still with decreased knee flexion AROM   OBJECTIVE  IMPAIRMENTS Abnormal gait, decreased activity tolerance, decreased endurance, decreased mobility, difficulty walking, decreased ROM, decreased strength, hypomobility, increased edema, increased fascial restrictions, impaired flexibility, improper body mechanics, and pain.   ACTIVITY LIMITATIONS bending, standing, squatting, sleeping, stairs, transfers, toileting, and locomotion level  PARTICIPATION LIMITATIONS: driving, shopping, community activity, and yard work  PERSONAL FACTORS Age, Fitness, and Past/current experiences are also affecting patient's functional outcome.   REHAB POTENTIAL: Good  CLINICAL DECISION MAKING: Stable/uncomplicated  EVALUATION COMPLEXITY: Low  GOALS: Goals reviewed with patient? Yes   LONG TERM GOALS: Target date: 06/06/2022   Pt will be ind with advancing HEP Baseline:  Goal status: INITIAL  2.  Pt will demo 0 to 120 deg L knee ROM for improved stair negotiation Baseline: 8 to 85 deg Goal status: INITIAL  3.  Pt will be able to amb at least 1 mile independently Baseline: Using cane in the house Goal status: INITIAL  4.  Pt will be able to perform 5x STS in </=13 sec to demo increased functional LE strength Baseline: 15 sec Goal status: INITIAL  5.  Pt will have improved FOTO to 76 Baseline: 47 Goal status: INITIAL    PLAN: PT FREQUENCY: 2x/week  PT DURATION: 6 weeks  PLANNED INTERVENTIONS: Therapeutic exercises, Therapeutic activity, Neuromuscular re-education, Balance training, Gait training, Patient/Family education, Self Care, Joint mobilization, Stair training, Aquatic Therapy, Dry Needling, Electrical stimulation, Cryotherapy, scar mobilization, Taping, Vasopneumatic device, Ionotophoresis 59m/ml Dexamethasone, Manual therapy, and Re-evaluation  PLAN FOR NEXT SESSION:  Continue to work on knee ROM and hip/knee strengthening.   Allix Blomquist, PT, DPT 05/03/2022, 12:22 PM

## 2022-05-05 ENCOUNTER — Encounter: Payer: Self-pay | Admitting: Physical Therapy

## 2022-05-05 ENCOUNTER — Ambulatory Visit: Payer: Medicare Other | Admitting: Physical Therapy

## 2022-05-05 DIAGNOSIS — G8929 Other chronic pain: Secondary | ICD-10-CM

## 2022-05-05 DIAGNOSIS — M25662 Stiffness of left knee, not elsewhere classified: Secondary | ICD-10-CM

## 2022-05-05 DIAGNOSIS — M25562 Pain in left knee: Secondary | ICD-10-CM | POA: Diagnosis not present

## 2022-05-05 DIAGNOSIS — R262 Difficulty in walking, not elsewhere classified: Secondary | ICD-10-CM

## 2022-05-05 DIAGNOSIS — M6281 Muscle weakness (generalized): Secondary | ICD-10-CM

## 2022-05-05 DIAGNOSIS — R600 Localized edema: Secondary | ICD-10-CM

## 2022-05-05 NOTE — Therapy (Signed)
OUTPATIENT PHYSICAL THERAPY   Patient Name: Tammy Mills MRN: [id number] DOB:Mar 19, 1956, 66 y.o., female Today's Date: 05/05/2022   PT End of Session - 05/05/22 1052     Visit Number 4    Number of Visits 12    Date for PT Re-Evaluation 06/06/22    Authorization Type Medicare    Authorization - Visit Number 4    Progress Note Due on Visit 10    PT Start Time 1055    PT Stop Time 1140    PT Time Calculation (min) 45 min    Activity Tolerance Patient tolerated treatment well    Behavior During Therapy WFL for tasks assessed/performed              Past Medical History:  Diagnosis Date   Mitral valve prolapse    mild to moderate regurgitation   Osteoporosis    Past Surgical History:  Procedure Laterality Date   BREAST EXCISIONAL BIOPSY Right    20 + yrs ago, benign   BREAST SURGERY     COLONOSCOPY     TOTAL KNEE ARTHROPLASTY Left 04/01/2022   Procedure: LEFT TOTAL KNEE ARTHROPLASTY;  Surgeon: Mcarthur Rossetti, MD;  Location: [facility];  Service: Orthopedics;  Laterality: Left;   WISDOM TOOTH EXTRACTION     Patient Active Problem List   Diagnosis Date Noted   Status post total left knee replacement 04/01/2022   Unilateral primary osteoarthritis, left knee 03/31/2022   Osteoporosis    Mitral valve prolapse    Osteopenia 05/07/2013   Routine general medical examination at a health care facility 04/30/2013   Right knee pain 05/08/2012   Right hip pain 05/08/2012   Knee pain, bilateral 06/03/2011   WEIGHT GAIN 06/28/2010    PCP: Jenna Luo  REFERRING PROVIDER: Pete Pelt, PA-C   REFERRING DIAG: status post left total knee arthroplasty 03/24/2022   THERAPY DIAG:  Chronic pain of left knee  Stiffness of left knee, not elsewhere classified  Muscle weakness (generalized)  Localized edema  Difficulty in walking, not elsewhere classified  Rationale for Evaluation and Treatment Rehabilitation  ONSET DATE: 03/24/22  SUBJECTIVE:   SUBJECTIVE  STATEMENT: Pt reports continued stiffness in knee. Has been using her floor pedal 10 min every hour. Has continued to do exercises 2x/day.   PERTINENT HISTORY: N/a  PAIN:  Are you having pain? No  PRECAUTIONS: None  WEIGHT BEARING RESTRICTIONS No  FALLS:  Has patient fallen in last 6 months? No  LIVING ENVIRONMENT: Lives with: lives with their family (2 sisters) Lives in: House/apartment Stairs: Yes: Internal: 14 steps; on right going up Has stair lift Has following equipment at home: Single point cane and Walker - 2 wheeled (uses cane in the house)  OCCUPATION: Retired  PLOF: [place] dog; goal for 3 miles of walking a day   OBJECTIVE:   (FROM EVAL) PATIENT SURVEYS:  FOTO 47; predicted 76  POSTURE: R knee valgus noted  PALPATION: TTP L medial > lateral knee due to edema  LOWER EXTREMITY ROM:  ROM Right eval Left eval Left 8/29 Left 8/31  Hip flexion      Hip extension      Hip abduction      Hip adduction      Hip internal rotation      Hip external rotation      Knee flexion 118 deg AROM  82 deg AROM 87 deg PROM 80 AROM 90 PROM 97 AROM 97 PROM  Knee  extension -20 deg  AROM -8 deg PROM -25 deg AROM -7 deg PROM -6 AROM -6 AROM  Ankle dorsiflexion      Ankle plantarflexion      Ankle inversion      Ankle eversion       (Blank rows = not tested)  LOWER EXTREMITY MMT:  MMT Right eval Left eval  Hip flexion 5/5 5/5  Hip extension 4-/5 4-/5  Hip abduction 3+/5 3-/5  Hip adduction    Hip internal rotation    Hip external rotation    Knee flexion 4/5 3+/5  Knee extension 4-/5 4/5  Ankle dorsiflexion    Ankle plantarflexion    Ankle inversion    Ankle eversion     (Blank rows = not tested)   FUNCTIONAL TESTS:  5 times sit to stand: 15 sec  GAIT: Distance walked: 96' Assistive device utilized: Single point cane and Environmental consultant - 2 wheeled Level of assistance: SBA Comments: Performs toe off over little toes into  slight ER; good heel strike    TODAY'S TREATMENT: 05/05/22 Nustep L5 x 5 mins UEs/LEs  Seated: Hamstring stretch 2x30 sec Knee flexion + scoot forward 3x30 sec Hamstring curl green TB 3x10 Knee ext green TB 3x10 Knee ext stretch with 3# weight x2 min Long sit quad set 2x10  Standing: TKE green TB 2x10x3 sec TKE with ball against wall 2x10x3 sec  Gait: Side stepping with cane 2x20' Backwards stepping with cane 2x20'    05/03/22 Nustep L5 x 5 mins  Supine: SKTC for knee flexion stretch 2 x 30 sec  Standing: Heel raise x 20 Step ups forward 4'' step 1 UE support x 10 Step ups laterally 4'' step 1 UE support x 10 Knee flexion 2 x 10 Mini squats 2 x 10 Hip abduction yellow TB 2 x 10 Laps around clinic after stretching/PROM  Seated: Heel slide for flexion x 10  Supine: Quad set x 10 3 sec hold SLR x 10 with quad set  Manual: PROM LT knee flexion and extension  04/27/22 THEREX: Nustep L5 x 5 min LEs/UEs  Seated Knee flexion stretch + scoot forward 3x30 sec  Supine Heel slide with strap for knee flexion 10x5sec Hip flexed with knee bending 2x10 Knee ext with bolster under heel x2 min Quad set with bolster 2x10 Hamstring stretch with strap 2x30 sec  Sidelying Hip abduction 2x10  Standing Heel raise 2x10 Toe off + knee flexion 2x10 Mini squat 2x10  GAIT: Amb 2x90' RW SBA; cues for trunk extension, heel strike and toe off   PATIENT EDUCATION:  Education details: Exam findings, gait pattern, things to add to current HEP Person educated: Patient Education method: Explanation, Demonstration, and Handouts Education comprehension: verbalized understanding and returned demonstration   HOME EXERCISE PROGRAM: Access Code: [email or web address]   ASSESSMENT:  CLINICAL IMPRESSION: 8/31: Pt with improving knee flexion. Progressed quad and hamstring strengthening. Cueing for hip extension with amb.   8/29: Discussed increasing walking/gait endurance and  encouraged pt to add short walks to her daily routine. Pt is improving PROM, still with decreased knee flexion AROM   OBJECTIVE IMPAIRMENTS Abnormal gait, decreased activity tolerance, decreased endurance, decreased mobility, difficulty walking, decreased ROM, decreased strength, hypomobility, increased edema, increased fascial restrictions, impaired flexibility, improper body mechanics, and pain.   ACTIVITY LIMITATIONS bending, standing, squatting, sleeping, stairs, transfers, toileting, and locomotion level  PARTICIPATION LIMITATIONS: driving, shopping, community activity, and yard work  PERSONAL FACTORS Age, Fitness, and Past/current experiences are also affecting patient's  functional outcome.   REHAB POTENTIAL: Good  CLINICAL DECISION MAKING: Stable/uncomplicated  EVALUATION COMPLEXITY: Low   GOALS: Goals reviewed with patient? Yes   LONG TERM GOALS: Target date: 06/06/2022   Pt will be ind with advancing HEP Baseline:  Goal status: IN PROGRESS  2.  Pt will demo 0 to 120 deg L knee ROM for improved stair negotiation Baseline: 8 to 85 deg Goal status: IN PROGRESS  3.  Pt will be able to amb at least 1 mile independently Baseline: Using cane in the house Goal status: IN PROGRESS  4.  Pt will be able to perform 5x STS in </=13 sec to demo increased functional LE strength Baseline: 15 sec Goal status: IN PROGRESS  5.  Pt will have improved FOTO to 76 Baseline: 47 Goal status: IN PROGRESS    PLAN: PT FREQUENCY: 2x/week  PT DURATION: 6 weeks  PLANNED INTERVENTIONS: Therapeutic exercises, Therapeutic activity, Neuromuscular re-education, Balance training, Gait training, Patient/Family education, Self Care, Joint mobilization, Stair training, Aquatic Therapy, Dry Needling, Electrical stimulation, Cryotherapy, scar mobilization, Taping, Vasopneumatic device, Ionotophoresis 95m/ml Dexamethasone, Manual therapy, and Re-evaluation  PLAN FOR NEXT SESSION:  Continue to work  on knee ROM and hip/knee strengthening.   Kaytelyn Glore April Ma L Ellinor Test, PT, DPT 05/05/2022, 10:52 AM

## 2022-05-10 ENCOUNTER — Encounter: Payer: Self-pay | Admitting: Physical Therapy

## 2022-05-10 ENCOUNTER — Ambulatory Visit: Payer: Medicare Other | Attending: Orthopaedic Surgery | Admitting: Physical Therapy

## 2022-05-10 DIAGNOSIS — R262 Difficulty in walking, not elsewhere classified: Secondary | ICD-10-CM | POA: Insufficient documentation

## 2022-05-10 DIAGNOSIS — M25662 Stiffness of left knee, not elsewhere classified: Secondary | ICD-10-CM | POA: Insufficient documentation

## 2022-05-10 DIAGNOSIS — M6281 Muscle weakness (generalized): Secondary | ICD-10-CM | POA: Insufficient documentation

## 2022-05-10 DIAGNOSIS — M25562 Pain in left knee: Secondary | ICD-10-CM | POA: Diagnosis present

## 2022-05-10 DIAGNOSIS — G8929 Other chronic pain: Secondary | ICD-10-CM | POA: Insufficient documentation

## 2022-05-10 DIAGNOSIS — R600 Localized edema: Secondary | ICD-10-CM | POA: Diagnosis present

## 2022-05-10 NOTE — Therapy (Signed)
OUTPATIENT PHYSICAL THERAPY   Patient Name: Tammy Mills MRN: [id number] DOB:October 31, 1955, 66 y.o., female Today's Date: 05/10/2022   PT End of Session - 05/10/22 1144     Visit Number 5    Number of Visits 12    Date for PT Re-Evaluation 06/06/22    Authorization - Visit Number 5    Progress Note Due on Visit 10    PT Start Time 4098    PT Stop Time 1143    PT Time Calculation (min) 45 min    Activity Tolerance Patient tolerated treatment well    Behavior During Therapy WFL for tasks assessed/performed               Past Medical History:  Diagnosis Date   Mitral valve prolapse    mild to moderate regurgitation   Osteoporosis    Past Surgical History:  Procedure Laterality Date   BREAST EXCISIONAL BIOPSY Right    20 + yrs ago, benign   BREAST SURGERY     COLONOSCOPY     TOTAL KNEE ARTHROPLASTY Left 04/01/2022   Procedure: LEFT TOTAL KNEE ARTHROPLASTY;  Surgeon: Mcarthur Rossetti, MD;  Location: [facility];  Service: Orthopedics;  Laterality: Left;   WISDOM TOOTH EXTRACTION     Patient Active Problem List   Diagnosis Date Noted   Status post total left knee replacement 04/01/2022   Unilateral primary osteoarthritis, left knee 03/31/2022   Osteoporosis    Mitral valve prolapse    Osteopenia 05/07/2013   Routine general medical examination at a health care facility 04/30/2013   Right knee pain 05/08/2012   Right hip pain 05/08/2012   Knee pain, bilateral 06/03/2011   WEIGHT GAIN 06/28/2010    PCP: Jenna Luo  REFERRING PROVIDER: Pete Pelt, PA-C   REFERRING DIAG: status post left total knee arthroplasty 03/24/2022   THERAPY DIAG:  Chronic pain of left knee  Stiffness of left knee, not elsewhere classified  Muscle weakness (generalized)  Localized edema  Difficulty in walking, not elsewhere classified  Rationale for Evaluation and Treatment Rehabilitation  ONSET DATE: 03/24/22  SUBJECTIVE:   SUBJECTIVE STATEMENT: Pt states she has  been working on bending her knee as much as possible. States it still feels "stiff". Wants to work on walking more for exercise  PERTINENT HISTORY: N/a  PAIN:  Are you having pain? No  PRECAUTIONS: None  WEIGHT BEARING RESTRICTIONS No  FALLS:  Has patient fallen in last 6 months? No  LIVING ENVIRONMENT: Lives with: lives with their family (2 sisters) Lives in: House/apartment Stairs: Yes: Internal: 14 steps; on right going up Has stair lift Has following equipment at home: Single point cane and Walker - 2 wheeled (uses cane in the house)  OCCUPATION: Retired  PLOF: [place] dog; goal for 3 miles of walking a day   OBJECTIVE:   (FROM EVAL) PATIENT SURVEYS:  FOTO 47; predicted 76  POSTURE: R knee valgus noted  PALPATION: TTP L medial > lateral knee due to edema  LOWER EXTREMITY ROM:  ROM Right eval Left eval Left 8/29 Left 8/31 Left 9/5  Hip flexion       Hip extension       Hip abduction       Hip adduction       Hip internal rotation       Hip external rotation       Knee flexion 118 deg AROM  82 deg AROM 87 deg PROM 80 AROM 90  PROM 97 AROM 97 PROM 97 AROM  Knee extension -20 deg  AROM -8 deg PROM -25 deg AROM -7 deg PROM -6 AROM -6 AROM -6 AROM -4 PROM  Ankle dorsiflexion       Ankle plantarflexion       Ankle inversion       Ankle eversion        (Blank rows = not tested)  LOWER EXTREMITY MMT:  MMT Right eval Left eval  Hip flexion 5/5 5/5  Hip extension 4-/5 4-/5  Hip abduction 3+/5 3-/5  Hip adduction    Hip internal rotation    Hip external rotation    Knee flexion 4/5 3+/5  Knee extension 4-/5 4/5  Ankle dorsiflexion    Ankle plantarflexion    Ankle inversion    Ankle eversion     (Blank rows = not tested)   FUNCTIONAL TESTS:  5 times sit to stand: 15 sec    TODAY'S TREATMENT:  05/10/22 Nustep L5 x 5 mins UEs/LEs  Gait: Gait with SPC outdoors up/down incline and on uneven  surface  Standing: TKE ball on wall 2 x 10 3 sec hold TKE green TB x 10 3 sec hold Step up 6'' step forward and laterally x 10  Seated: HS curl green TB 3 x 10 Knee ext 2 x 10 3sec hold 2#  Supine: SLR 2# 2 x 10 Heel slide 2 x 10   05/05/22 Nustep L5 x 5 mins UEs/LEs  Seated: Hamstring stretch 2x30 sec Knee flexion + scoot forward 3x30 sec Hamstring curl green TB 3x10 Knee ext green TB 3x10 Knee ext stretch with 3# weight x2 min Long sit quad set 2x10  Standing: TKE green TB 2x10x3 sec TKE with ball against wall 2x10x3 sec  Gait: Side stepping with cane 2x20' Backwards stepping with cane 2x20'     PATIENT EDUCATION:  Education details: Exam findings, gait pattern, things to add to current HEP Person educated: Patient Education method: Explanation, Demonstration, and Handouts Education comprehension: verbalized understanding and returned demonstration   HOME EXERCISE PROGRAM: Access Code: [email or web address]   ASSESSMENT:  CLINICAL IMPRESSION: Pt is progressing ROM. Working on improving strength as well as ROM. Dynamic gait with SPC with good tolerance. PT encouraged pt to walk outdoors at home and to try her recumbent bike    GOALS: Goals reviewed with patient? Yes   LONG TERM GOALS: Target date: 06/06/2022   Pt will be ind with advancing HEP Baseline:  Goal status: IN PROGRESS  2.  Pt will demo 0 to 120 deg L knee ROM for improved stair negotiation Baseline: 8 to 85 deg Goal status: IN PROGRESS  3.  Pt will be able to amb at least 1 mile independently Baseline: Using cane in the house Goal status: IN PROGRESS  4.  Pt will be able to perform 5x STS in </=13 sec to demo increased functional LE strength Baseline: 15 sec Goal status: IN PROGRESS  5.  Pt will have improved FOTO to 76 Baseline: 47 Goal status: IN PROGRESS    PLAN: PT FREQUENCY: 2x/week  PT DURATION: 6 weeks  PLANNED INTERVENTIONS: Therapeutic exercises, Therapeutic activity,  Neuromuscular re-education, Balance training, Gait training, Patient/Family education, Self Care, Joint mobilization, Stair training, Aquatic Therapy, Dry Needling, Electrical stimulation, Cryotherapy, scar mobilization, Taping, Vasopneumatic device, Ionotophoresis 4mg /ml Dexamethasone, Manual therapy, and Re-evaluation  PLAN FOR NEXT SESSION:  Continue to work on knee ROM and hip/knee strengthening. Try recumbent bike   Sholanda Croson, PT, DPT 05/10/2022,  11:45 AM

## 2022-05-12 ENCOUNTER — Encounter: Payer: Self-pay | Admitting: Physician Assistant

## 2022-05-12 ENCOUNTER — Ambulatory Visit (INDEPENDENT_AMBULATORY_CARE_PROVIDER_SITE_OTHER): Payer: Medicare Other | Admitting: Physician Assistant

## 2022-05-12 ENCOUNTER — Other Ambulatory Visit: Payer: Self-pay | Admitting: Orthopaedic Surgery

## 2022-05-12 DIAGNOSIS — Z96652 Presence of left artificial knee joint: Secondary | ICD-10-CM

## 2022-05-12 MED ORDER — HYDROCODONE-ACETAMINOPHEN 5-325 MG PO TABS
1.0000 | ORAL_TABLET | Freq: Four times a day (QID) | ORAL | 0 refills | Status: DC | PRN
Start: 2022-05-12 — End: 2022-11-16

## 2022-05-12 NOTE — Progress Notes (Signed)
HPI: [name] returns today status post left total knee arthroplasty 04/01/2022.  She states she is overall doing well.  She continues to go to therapy.  She states her pain is 5 out of 10 at worst.  She is taking Norco occasionally.  Review of systems: See HPI otherwise negative  Physical exam: General well-developed well-nourished female who ambulates without any assistive device. Left knee: Full extension flexion to 105 degrees no gross instability valgus varus stressing.  Surgical incisions healing well no signs of infection or wound dehiscence.  Calf supple nontender dorsiflexion plantarflexion ankle intact.  Impression: Status post left total knee arthroplasty 03/24/2022  Plan: She will continue work on range of motion strengthening.  We will see her back in 1 month to see how she is doing overall.  Scar tissue mobilization encouraged.  Questions encouraged and answered at length.

## 2022-05-13 ENCOUNTER — Ambulatory Visit: Payer: Medicare Other | Admitting: Physical Therapy

## 2022-05-13 ENCOUNTER — Encounter: Payer: Self-pay | Admitting: Physical Therapy

## 2022-05-13 DIAGNOSIS — M25662 Stiffness of left knee, not elsewhere classified: Secondary | ICD-10-CM

## 2022-05-13 DIAGNOSIS — R600 Localized edema: Secondary | ICD-10-CM

## 2022-05-13 DIAGNOSIS — M25562 Pain in left knee: Secondary | ICD-10-CM | POA: Diagnosis not present

## 2022-05-13 DIAGNOSIS — G8929 Other chronic pain: Secondary | ICD-10-CM

## 2022-05-13 DIAGNOSIS — R262 Difficulty in walking, not elsewhere classified: Secondary | ICD-10-CM

## 2022-05-13 DIAGNOSIS — M6281 Muscle weakness (generalized): Secondary | ICD-10-CM

## 2022-05-13 NOTE — Therapy (Signed)
OUTPATIENT PHYSICAL THERAPY   Patient Name: Tammy Mills MRN: [id number] DOB:1956-05-14, 66 y.o., female Today's Date: 05/13/2022   PT End of Session - 05/13/22 1054     Visit Number 6    Number of Visits 12    Date for PT Re-Evaluation 06/06/22    Authorization Type Medicare    Authorization - Visit Number 6    Progress Note Due on Visit 10    PT Start Time 1055    PT Stop Time 1140    PT Time Calculation (min) 45 min    Activity Tolerance Patient tolerated treatment well    Behavior During Therapy WFL for tasks assessed/performed               Past Medical History:  Diagnosis Date   Mitral valve prolapse    mild to moderate regurgitation   Osteoporosis    Past Surgical History:  Procedure Laterality Date   BREAST EXCISIONAL BIOPSY Right    20 + yrs ago, benign   BREAST SURGERY     COLONOSCOPY     TOTAL KNEE ARTHROPLASTY Left 04/01/2022   Procedure: LEFT TOTAL KNEE ARTHROPLASTY;  Surgeon: Mcarthur Rossetti, MD;  Location: [facility];  Service: Orthopedics;  Laterality: Left;   WISDOM TOOTH EXTRACTION     Patient Active Problem List   Diagnosis Date Noted   Status post total left knee replacement 04/01/2022   Unilateral primary osteoarthritis, left knee 03/31/2022   Osteoporosis    Mitral valve prolapse    Osteopenia 05/07/2013   Routine general medical examination at a health care facility 04/30/2013   Right knee pain 05/08/2012   Right hip pain 05/08/2012   Knee pain, bilateral 06/03/2011   WEIGHT GAIN 06/28/2010    PCP: Jenna Luo  REFERRING PROVIDER: Pete Pelt, PA-C   REFERRING DIAG: status post left total knee arthroplasty 03/24/2022   THERAPY DIAG:  Chronic pain of left knee  Stiffness of left knee, not elsewhere classified  Muscle weakness (generalized)  Localized edema  Difficulty in walking, not elsewhere classified  Rationale for Evaluation and Treatment Rehabilitation  ONSET DATE: 03/24/22  SUBJECTIVE:   SUBJECTIVE  STATEMENT: Pt did 6 wk f/u with ortho yesterday. She states he's been happy with her progress. Has been working on the knee bending using the pedal at home.   PERTINENT HISTORY: N/a  PAIN:  Are you having pain? No  PRECAUTIONS: None  WEIGHT BEARING RESTRICTIONS No  FALLS:  Has patient fallen in last 6 months? No  LIVING ENVIRONMENT: Lives with: lives with their family (2 sisters) Lives in: House/apartment Stairs: Yes: Internal: 14 steps; on right going up Has stair lift Has following equipment at home: Single point cane and Walker - 2 wheeled (uses cane in the house)  OCCUPATION: Retired  PLOF: [place] dog; goal for 3 miles of walking a day   OBJECTIVE:   (FROM EVAL) PATIENT SURVEYS:  FOTO 47; predicted 76  POSTURE: R knee valgus noted  PALPATION: TTP L medial > lateral knee due to edema  LOWER EXTREMITY ROM:  ROM Right eval Left eval Left 8/29 Left 8/31 Left 9/5  Hip flexion       Hip extension       Hip abduction       Hip adduction       Hip internal rotation       Hip external rotation       Knee flexion 118 deg AROM  82  deg AROM 87 deg PROM 80 AROM 90 PROM 97 AROM 97 PROM 97 AROM  Knee extension -20 deg  AROM -8 deg PROM -25 deg AROM -7 deg PROM -6 AROM -6 AROM -6 AROM -4 PROM  Ankle dorsiflexion       Ankle plantarflexion       Ankle inversion       Ankle eversion        (Blank rows = not tested)  LOWER EXTREMITY MMT:  MMT Right eval Left eval  Hip flexion 5/5 5/5  Hip extension 4-/5 4-/5  Hip abduction 3+/5 3-/5  Hip adduction    Hip internal rotation    Hip external rotation    Knee flexion 4/5 3+/5  Knee extension 4-/5 4/5  Ankle dorsiflexion    Ankle plantarflexion    Ankle inversion    Ankle eversion     (Blank rows = not tested)   FUNCTIONAL TESTS:  5 times sit to stand: 15 sec    TODAY'S TREATMENT: 05/13/22 Recumbent bike L1 x 5 min half revolutions to full revolutions  Prone: Quad  stretch with strap 3x30 sec Hamstring curl 3x10 Hip extension 2x10 R&L Quad set 2x10 Knee hang x2 min with 2# weight  Seated: Quad set 2x10 LAQ 2# 2x10 Hamstring stretch x30 sec  Standing:   L foot tap 6" step x10   Hamstring curl x10   Heel/toe raise 2x10   Marching 2x10   Forward step up 4" step 2x10   Lateral step up 4" step x10         05/10/22 Nustep L5 x 5 mins UEs/LEs  Gait: Gait with SPC outdoors up/down incline and on uneven surface  Standing: TKE ball on wall 2 x 10 3 sec hold TKE green TB x 10 3 sec hold Step up 6'' step forward and laterally x 10  Seated: HS curl green TB 3 x 10 Knee ext 2 x 10 3sec hold 2#  Supine: SLR 2# 2 x 10 Heel slide 2 x 10   05/05/22 Nustep L5 x 5 mins UEs/LEs  Seated: Hamstring stretch 2x30 sec Knee flexion + scoot forward 3x30 sec Hamstring curl green TB 3x10 Knee ext green TB 3x10 Knee ext stretch with 3# weight x2 min Long sit quad set 2x10  Standing: TKE green TB 2x10x3 sec TKE with ball against wall 2x10x3 sec  Gait: Side stepping with cane 2x20' Backwards stepping with cane 2x20'     PATIENT EDUCATION:  Education details: Exam findings, gait pattern, things to add to current HEP Person educated: Patient Education method: Explanation, Demonstration, and Handouts Education comprehension: verbalized understanding and returned demonstration   HOME EXERCISE PROGRAM: Access Code: [email or web address]   ASSESSMENT:  CLINICAL IMPRESSION: Pt able to ride bike and obtain full revolutions this session. Initiated prone exercises. Found pt to have very tight L quad. Continued standing exercises on steps; however, pt had intense hamstring muscle spasm/cramp limiting tolerance to 6" step.     GOALS: Goals reviewed with patient? Yes   LONG TERM GOALS: Target date: 06/06/2022   Pt will be ind with advancing HEP Baseline:  Goal status: IN PROGRESS  2.  Pt will demo 0 to 120 deg L knee ROM for improved stair  negotiation Baseline: 8 to 85 deg Goal status: IN PROGRESS  3.  Pt will be able to amb at least 1 mile independently Baseline: Using cane in the house Goal status: IN PROGRESS  4.  Pt will be able to  perform 5x STS in </=13 sec to demo increased functional LE strength Baseline: 15 sec Goal status: IN PROGRESS  5.  Pt will have improved FOTO to 76 Baseline: 47 Goal status: IN PROGRESS    PLAN: PT FREQUENCY: 2x/week  PT DURATION: 6 weeks  PLANNED INTERVENTIONS: Therapeutic exercises, Therapeutic activity, Neuromuscular re-education, Balance training, Gait training, Patient/Family education, Self Care, Joint mobilization, Stair training, Aquatic Therapy, Dry Needling, Electrical stimulation, Cryotherapy, scar mobilization, Taping, Vasopneumatic device, Ionotophoresis 58m/ml Dexamethasone, Manual therapy, and Re-evaluation  PLAN FOR NEXT SESSION:  Continue to work on knee ROM and hip/knee strengthening.   Dynasia Kercheval April Ma L Unique Sillas, PT, DPT 05/13/2022, 10:54 AM

## 2022-05-17 ENCOUNTER — Ambulatory Visit: Payer: Medicare Other | Admitting: Physical Therapy

## 2022-05-17 ENCOUNTER — Encounter: Payer: Self-pay | Admitting: Physical Therapy

## 2022-05-17 DIAGNOSIS — M25662 Stiffness of left knee, not elsewhere classified: Secondary | ICD-10-CM

## 2022-05-17 DIAGNOSIS — M25562 Pain in left knee: Secondary | ICD-10-CM | POA: Diagnosis not present

## 2022-05-17 DIAGNOSIS — R262 Difficulty in walking, not elsewhere classified: Secondary | ICD-10-CM

## 2022-05-17 DIAGNOSIS — R600 Localized edema: Secondary | ICD-10-CM

## 2022-05-17 DIAGNOSIS — M6281 Muscle weakness (generalized): Secondary | ICD-10-CM

## 2022-05-17 DIAGNOSIS — G8929 Other chronic pain: Secondary | ICD-10-CM

## 2022-05-17 NOTE — Therapy (Signed)
OUTPATIENT PHYSICAL THERAPY   Patient Name: Tammy Mills MRN: [id number] DOB:[birth year], 66 y.o., female Today's Date: 05/17/2022   PT End of Session - 05/17/22 1148     Visit Number 7    Number of Visits 12    Date for PT Re-Evaluation 06/06/22    Authorization - Visit Number 7    Progress Note Due on Visit 10    PT Start Time 1100    PT Stop Time 1145    PT Time Calculation (min) 45 min    Activity Tolerance Patient tolerated treatment well    Behavior During Therapy WFL for tasks assessed/performed                Past Medical History:  Diagnosis Date   Mitral valve prolapse    mild to moderate regurgitation   Osteoporosis    Past Surgical History:  Procedure Laterality Date   BREAST EXCISIONAL BIOPSY Right    20 + yrs ago, benign   BREAST SURGERY     COLONOSCOPY     TOTAL KNEE ARTHROPLASTY Left 04/01/2022   Procedure: LEFT TOTAL KNEE ARTHROPLASTY;  Surgeon: Mcarthur Rossetti, MD;  Location: [facility];  Service: Orthopedics;  Laterality: Left;   WISDOM TOOTH EXTRACTION     Patient Active Problem List   Diagnosis Date Noted   Status post total left knee replacement 04/01/2022   Unilateral primary osteoarthritis, left knee 03/31/2022   Osteoporosis    Mitral valve prolapse    Osteopenia 05/07/2013   Routine general medical examination at a health care facility 04/30/2013   Right knee pain 05/08/2012   Right hip pain 05/08/2012   Knee pain, bilateral 06/03/2011   WEIGHT GAIN 06/28/2010    PCP: Jenna Luo  REFERRING PROVIDER: Pete Pelt, PA-C   REFERRING DIAG: status post left total knee arthroplasty 03/24/2022   THERAPY DIAG:  Chronic pain of left knee  Stiffness of left knee, not elsewhere classified  Muscle weakness (generalized)  Localized edema  Difficulty in walking, not elsewhere classified  Rationale for Evaluation and Treatment Rehabilitation  ONSET DATE: 03/24/22  SUBJECTIVE:   SUBJECTIVE STATEMENT: Pt states she  still gets stiff after sitting for even short periods of time. Her leg is feeling better after having a "knot" in it after last visit  PERTINENT HISTORY: N/a  PAIN:  Are you having pain? No  PRECAUTIONS: None  WEIGHT BEARING RESTRICTIONS No  FALLS:  Has patient fallen in last 6 months? No  LIVING ENVIRONMENT: Lives with: lives with their family (2 sisters) Lives in: House/apartment Stairs: Yes: Internal: 14 steps; on right going up Has stair lift Has following equipment at home: Single point cane and Walker - 2 wheeled (uses cane in the house)  OCCUPATION: Retired  PLOF: [place] dog; goal for 3 miles of walking a day   OBJECTIVE:   (FROM EVAL) PATIENT SURVEYS:  FOTO 47; predicted 76  POSTURE: R knee valgus noted  PALPATION: TTP L medial > lateral knee due to edema  LOWER EXTREMITY ROM:  ROM Right eval Left eval Left 8/29 Left 8/31 Left 9/5 Left 9/12  Hip flexion        Hip extension        Hip abduction        Hip adduction        Hip internal rotation        Hip external rotation        Knee flexion 118 deg AROM  82 deg AROM 87 deg PROM 80 AROM 90 PROM 97 AROM 97 PROM 97 AROM 100 AROM  Knee extension -20 deg  AROM -8 deg PROM -25 deg AROM -7 deg PROM -6 AROM -6 AROM -6 AROM -4 PROM -4 AROM  Ankle dorsiflexion        Ankle plantarflexion        Ankle inversion        Ankle eversion         (Blank rows = not tested)  LOWER EXTREMITY MMT:  MMT Right eval Left eval  Hip flexion 5/5 5/5  Hip extension 4-/5 4-/5  Hip abduction 3+/5 3-/5  Hip adduction    Hip internal rotation    Hip external rotation    Knee flexion 4/5 3+/5  Knee extension 4-/5 4/5  Ankle dorsiflexion    Ankle plantarflexion    Ankle inversion    Ankle eversion     (Blank rows = not tested)   FUNCTIONAL TESTS:  5 times sit to stand: 15 sec    TODAY'S TREATMENT: 05/17/22 Nustep L6 x 5 min  Standing: Heel raise 2 x 10 HS curl 2 x  10 Forward step up 6'' step x 10 1 UE support, then x 10 4'' step Lateral step ups 2 x 10 4'' step 1 UE support Marching 2 x 10  Seated: LAQ 2# 2 x 10 Heel slide for knee flexion x 10 HS stretch x 30 sec  Supine: SLR 2 x 10 with quad set 2#  Prone: HS curl 2 x 10  05/13/22 Recumbent bike L1 x 5 min half revolutions to full revolutions  Prone: Quad stretch with strap 3x30 sec Hamstring curl 3x10 Hip extension 2x10 R&L Quad set 2x10 Knee hang x2 min with 2# weight  Seated: Quad set 2x10 LAQ 2# 2x10 Hamstring stretch x30 sec  Standing:   L foot tap 6" step x10   Hamstring curl x10   Heel/toe raise 2x10   Marching 2x10   Forward step up 4" step 2x10   Lateral step up 4" step x10         05/10/22 Nustep L5 x 5 mins UEs/LEs  Gait: Gait with SPC outdoors up/down incline and on uneven surface  Standing: TKE ball on wall 2 x 10 3 sec hold TKE green TB x 10 3 sec hold Step up 6'' step forward and laterally x 10  Seated: HS curl green TB 3 x 10 Knee ext 2 x 10 3sec hold 2#  Supine: SLR 2# 2 x 10 Heel slide 2 x 10     PATIENT EDUCATION:  Education details: Exam findings, gait pattern, things to add to current HEP Person educated: Patient Education method: Explanation, Demonstration, and Handouts Education comprehension: verbalized understanding and returned demonstration   HOME EXERCISE PROGRAM: Access Code: [email or web address]   ASSESSMENT:  CLINICAL IMPRESSION: Pt able to perform 10x step up to 6'' step today. Some HS cramping with prone HS curl, relieved with rest. She will benefit from continued strength and endurance training    GOALS: Goals reviewed with patient? Yes   LONG TERM GOALS: Target date: 06/06/2022   Pt will be ind with advancing HEP Baseline:  Goal status: IN PROGRESS  2.  Pt will demo 0 to 120 deg L knee ROM for improved stair negotiation Baseline: 8 to 85 deg Goal status: IN PROGRESS  3.  Pt will be able to amb at least 1 mile  independently Baseline: Using cane in the  house Goal status: IN PROGRESS  4.  Pt will be able to perform 5x STS in </=13 sec to demo increased functional LE strength Baseline: 15 sec Goal status: IN PROGRESS  5.  Pt will have improved FOTO to 76 Baseline: 47 Goal status: IN PROGRESS    PLAN: PT FREQUENCY: 2x/week  PT DURATION: 6 weeks  PLANNED INTERVENTIONS: Therapeutic exercises, Therapeutic activity, Neuromuscular re-education, Balance training, Gait training, Patient/Family education, Self Care, Joint mobilization, Stair training, Aquatic Therapy, Dry Needling, Electrical stimulation, Cryotherapy, scar mobilization, Taping, Vasopneumatic device, Ionotophoresis 67m/ml Dexamethasone, Manual therapy, and Re-evaluation  PLAN FOR NEXT SESSION:  Continue to work on knee ROM and hip/knee strengthening.   Kymir Coles, PT, DPT 05/17/2022, 11:56 AM

## 2022-05-20 ENCOUNTER — Ambulatory Visit: Payer: Medicare Other | Admitting: Physical Therapy

## 2022-05-20 ENCOUNTER — Encounter: Payer: Self-pay | Admitting: Physical Therapy

## 2022-05-20 DIAGNOSIS — R600 Localized edema: Secondary | ICD-10-CM

## 2022-05-20 DIAGNOSIS — R262 Difficulty in walking, not elsewhere classified: Secondary | ICD-10-CM

## 2022-05-20 DIAGNOSIS — G8929 Other chronic pain: Secondary | ICD-10-CM

## 2022-05-20 DIAGNOSIS — M25562 Pain in left knee: Secondary | ICD-10-CM | POA: Diagnosis not present

## 2022-05-20 DIAGNOSIS — M25662 Stiffness of left knee, not elsewhere classified: Secondary | ICD-10-CM

## 2022-05-20 DIAGNOSIS — M6281 Muscle weakness (generalized): Secondary | ICD-10-CM

## 2022-05-20 NOTE — Therapy (Signed)
OUTPATIENT PHYSICAL THERAPY   Patient Name: Tammy Mills MRN: [id number] DOB:27-Dec-1955, 66 y.o., female Today's Date: 05/20/2022   PT End of Session - 05/20/22 1057     Visit Number 8    Number of Visits 12    Date for PT Re-Evaluation 06/06/22    Authorization Type Medicare    Authorization - Visit Number 8    Progress Note Due on Visit 10    PT Start Time 1100    PT Stop Time 1140    PT Time Calculation (min) 40 min    Activity Tolerance Patient tolerated treatment well    Behavior During Therapy WFL for tasks assessed/performed                Past Medical History:  Diagnosis Date   Mitral valve prolapse    mild to moderate regurgitation   Osteoporosis    Past Surgical History:  Procedure Laterality Date   BREAST EXCISIONAL BIOPSY Right    20 + yrs ago, benign   BREAST SURGERY     COLONOSCOPY     TOTAL KNEE ARTHROPLASTY Left 04/01/2022   Procedure: LEFT TOTAL KNEE ARTHROPLASTY;  Surgeon: Mcarthur Rossetti, MD;  Location: [facility];  Service: Orthopedics;  Laterality: Left;   WISDOM TOOTH EXTRACTION     Patient Active Problem List   Diagnosis Date Noted   Status post total left knee replacement 04/01/2022   Unilateral primary osteoarthritis, left knee 03/31/2022   Osteoporosis    Mitral valve prolapse    Osteopenia 05/07/2013   Routine general medical examination at a health care facility 04/30/2013   Right knee pain 05/08/2012   Right hip pain 05/08/2012   Knee pain, bilateral 06/03/2011   WEIGHT GAIN 06/28/2010    PCP: Jenna Luo  REFERRING PROVIDER: Pete Pelt, PA-C   REFERRING DIAG: status post left total knee arthroplasty 03/24/2022   THERAPY DIAG:  Chronic pain of left knee  Stiffness of left knee, not elsewhere classified  Muscle weakness (generalized)  Localized edema  Difficulty in walking, not elsewhere classified  Rationale for Evaluation and Treatment Rehabilitation  ONSET DATE: 03/24/22  SUBJECTIVE:    SUBJECTIVE STATEMENT: Pt states she did a good amount of walking yesterday. Has been feeling pretty good. Pt to follow up with ortho Oct 9 week.   PERTINENT HISTORY: N/a  PAIN:  Are you having pain? No  PRECAUTIONS: None  WEIGHT BEARING RESTRICTIONS No  FALLS:  Has patient fallen in last 6 months? No  LIVING ENVIRONMENT: Lives with: lives with their family (2 sisters) Lives in: House/apartment Stairs: Yes: Internal: 14 steps; on right going up Has stair lift Has following equipment at home: Single point cane and Walker - 2 wheeled (uses cane in the house)  OCCUPATION: Retired  PLOF: [place] dog; goal for 3 miles of walking a day   OBJECTIVE:   (FROM EVAL) PATIENT SURVEYS:  FOTO 47; predicted 76  POSTURE: R knee valgus noted  PALPATION: TTP L medial > lateral knee due to edema  LOWER EXTREMITY ROM:  ROM Right eval Left eval Left 8/29 Left 8/31 Left 9/5 Left 9/12 Left 9/15  Hip flexion         Hip extension         Hip abduction         Hip adduction         Hip internal rotation         Hip external rotation  Knee flexion 118 deg AROM  82 deg AROM 87 deg PROM 80 AROM 90 PROM 97 AROM 97 PROM 97 AROM 100 AROM 106 PROM with strap sup  Knee extension -20 deg  AROM -8 deg PROM -25 deg AROM -7 deg PROM -6 AROM -6 AROM -6 AROM -4 PROM -4 AROM   Ankle dorsiflexion         Ankle plantarflexion         Ankle inversion         Ankle eversion          (Blank rows = not tested)  LOWER EXTREMITY MMT:  MMT Right eval Left eval  Hip flexion 5/5 5/5  Hip extension 4-/5 4-/5  Hip abduction 3+/5 3-/5  Hip adduction    Hip internal rotation    Hip external rotation    Knee flexion 4/5 3+/5  Knee extension 4-/5 4/5  Ankle dorsiflexion    Ankle plantarflexion    Ankle inversion    Ankle eversion     (Blank rows = not tested)   FUNCTIONAL TESTS:  5 times sit to stand: 15 sec    TODAY'S  TREATMENT: 05/20/22 Recumbent bike L1 x5 min full revolutions  Supine: Heel slides with strap for knee flexion 2x10x3 sec hold  Quadruped: Rock forward/back hips to heels x10 Hamstring curl 2x10 Hip ext 2x10 Fire hydrant 2x10  Manual therapy: Knee flexion with towel grade II to III   05/17/22 Nustep L6 x 5 min  Standing: Heel raise 2 x 10 HS curl 2 x 10 Forward step up 6'' step x 10 1 UE support, then x 10 4'' step Lateral step ups 2 x 10 4'' step 1 UE support Marching 2 x 10  Seated: LAQ 2# 2 x 10 Heel slide for knee flexion x 10 HS stretch x 30 sec  Supine: SLR 2 x 10 with quad set 2#  Prone: HS curl 2 x 10  05/13/22 Recumbent bike L1 x 5 min half revolutions to full revolutions  Prone: Quad stretch with strap 3x30 sec Hamstring curl 3x10 Hip extension 2x10 R&L Quad set 2x10 Knee hang x2 min with 2# weight  Seated: Quad set 2x10 LAQ 2# 2x10 Hamstring stretch x30 sec  Standing:   L foot tap 6" step x10   Hamstring curl x10   Heel/toe raise 2x10   Marching 2x10   Forward step up 4" step 2x10   Lateral step up 4" step x10         PATIENT EDUCATION:  Education details: Exam findings, gait pattern, things to add to current HEP Person educated: Patient Education method: Explanation, Demonstration, and Handouts Education comprehension: verbalized understanding and returned demonstration   HOME EXERCISE PROGRAM: Access Code: [email or web address]   ASSESSMENT:  CLINICAL IMPRESSION: Pt able to come into quadruped this session. Continued to work on knee flexion and strengthening. Now able to obtain >105 deg PROM.    GOALS: Goals reviewed with patient? Yes   LONG TERM GOALS: Target date: 06/06/2022   Pt will be ind with advancing HEP Baseline:  Goal status: IN PROGRESS  2.  Pt will demo 0 to 120 deg L knee ROM for improved stair negotiation Baseline: 8 to 85 deg Goal status: IN PROGRESS  3.  Pt will be able to amb at least 1 mile  independently Baseline: Using cane in the house Goal status: IN PROGRESS  4.  Pt will be able to perform 5x STS in </=13 sec to demo increased  functional LE strength Baseline: 15 sec Goal status: IN PROGRESS  5.  Pt will have improved FOTO to 76 Baseline: 47 Goal status: IN PROGRESS    PLAN: PT FREQUENCY: 2x/week  PT DURATION: 6 weeks  PLANNED INTERVENTIONS: Therapeutic exercises, Therapeutic activity, Neuromuscular re-education, Balance training, Gait training, Patient/Family education, Self Care, Joint mobilization, Stair training, Aquatic Therapy, Dry Needling, Electrical stimulation, Cryotherapy, scar mobilization, Taping, Vasopneumatic device, Ionotophoresis 80m/ml Dexamethasone, Manual therapy, and Re-evaluation  PLAN FOR NEXT SESSION:  Continue to work on knee ROM and hip/knee strengthening.   Damek Ende April Ma L Dessie Tatem, PT, DPT 05/20/2022, 10:57 AM

## 2022-05-24 ENCOUNTER — Ambulatory Visit: Payer: Medicare Other | Admitting: Physical Therapy

## 2022-05-24 ENCOUNTER — Other Ambulatory Visit: Payer: Self-pay | Admitting: Orthopaedic Surgery

## 2022-05-24 ENCOUNTER — Encounter: Payer: Self-pay | Admitting: Physical Therapy

## 2022-05-24 DIAGNOSIS — R600 Localized edema: Secondary | ICD-10-CM

## 2022-05-24 DIAGNOSIS — R262 Difficulty in walking, not elsewhere classified: Secondary | ICD-10-CM

## 2022-05-24 DIAGNOSIS — G8929 Other chronic pain: Secondary | ICD-10-CM

## 2022-05-24 DIAGNOSIS — M25662 Stiffness of left knee, not elsewhere classified: Secondary | ICD-10-CM

## 2022-05-24 DIAGNOSIS — M6281 Muscle weakness (generalized): Secondary | ICD-10-CM

## 2022-05-24 DIAGNOSIS — M25562 Pain in left knee: Secondary | ICD-10-CM | POA: Diagnosis not present

## 2022-05-24 NOTE — Therapy (Signed)
OUTPATIENT PHYSICAL THERAPY   Patient Name: Tammy Mills MRN: [id number] DOB:1955-12-23, 66 y.o., female Today's Date: 05/24/2022   PT End of Session - 05/24/22 1017     Visit Number 9    Number of Visits 12    Date for PT Re-Evaluation 06/06/22    Authorization Type Medicare    Authorization - Visit Number 9    Progress Note Due on Visit 10    PT Start Time 1017    PT Stop Time 1100    PT Time Calculation (min) 43 min    Activity Tolerance Patient tolerated treatment well    Behavior During Therapy WFL for tasks assessed/performed                Past Medical History:  Diagnosis Date   Mitral valve prolapse    mild to moderate regurgitation   Osteoporosis    Past Surgical History:  Procedure Laterality Date   BREAST EXCISIONAL BIOPSY Right    20 + yrs ago, benign   BREAST SURGERY     COLONOSCOPY     TOTAL KNEE ARTHROPLASTY Left 04/01/2022   Procedure: LEFT TOTAL KNEE ARTHROPLASTY;  Surgeon: Mcarthur Rossetti, MD;  Location: [facility];  Service: Orthopedics;  Laterality: Left;   WISDOM TOOTH EXTRACTION     Patient Active Problem List   Diagnosis Date Noted   Status post total left knee replacement 04/01/2022   Unilateral primary osteoarthritis, left knee 03/31/2022   Osteoporosis    Mitral valve prolapse    Osteopenia 05/07/2013   Routine general medical examination at a health care facility 04/30/2013   Right knee pain 05/08/2012   Right hip pain 05/08/2012   Knee pain, bilateral 06/03/2011   WEIGHT GAIN 06/28/2010    PCP: Jenna Luo  REFERRING PROVIDER: Pete Pelt, PA-C   REFERRING DIAG: status post left total knee arthroplasty 03/24/2022   THERAPY DIAG:  Chronic pain of left knee  Stiffness of left knee, not elsewhere classified  Muscle weakness (generalized)  Localized edema  Difficulty in walking, not elsewhere classified  Rationale for Evaluation and Treatment Rehabilitation  ONSET DATE: 03/24/22  SUBJECTIVE:    SUBJECTIVE STATEMENT: Pt reports she was able to go for a mile walk.   PERTINENT HISTORY: N/a  PAIN:  Are you having pain? No  PRECAUTIONS: None  WEIGHT BEARING RESTRICTIONS No  FALLS:  Has patient fallen in last 6 months? No  LIVING ENVIRONMENT: Lives with: lives with their family (2 sisters) Lives in: House/apartment Stairs: Yes: Internal: 14 steps; on right going up Has stair lift Has following equipment at home: Single point cane and Walker - 2 wheeled (uses cane in the house)  OCCUPATION: Retired  PLOF: Independent  PATIENT GOALS Walk dog; goal for 3 miles of walking a day   OBJECTIVE:   (FROM EVAL) PATIENT SURVEYS:  FOTO 47; predicted 76  POSTURE: R knee valgus noted  PALPATION: TTP L medial > lateral knee due to edema  LOWER EXTREMITY ROM:  ROM Right eval Left eval Left 8/31 Left 9/5 Left 9/12 Left 9/15 Left 9/19  Hip flexion         Hip extension         Hip abduction         Hip adduction         Hip internal rotation         Hip external rotation         Knee flexion 118 deg AROM  82  deg AROM 87 deg PROM 97 AROM 97 PROM 97 AROM 100 AROM 106 PROM with strap sup 115 with strap supine  Knee extension -20 deg  AROM -8 deg PROM -25 deg AROM -7 deg PROM -6 AROM -6 AROM -4 PROM -4 AROM  0 in prone with knee hanging  Ankle dorsiflexion         Ankle plantarflexion         Ankle inversion         Ankle eversion          (Blank rows = not tested)  LOWER EXTREMITY MMT:  MMT Right eval Left eval  Hip flexion 5/5 5/5  Hip extension 4-/5 4-/5  Hip abduction 3+/5 3-/5  Hip adduction    Hip internal rotation    Hip external rotation    Knee flexion 4/5 3+/5  Knee extension 4-/5 4/5  Ankle dorsiflexion    Ankle plantarflexion    Ankle inversion    Ankle eversion     (Blank rows = not tested)   FUNCTIONAL TESTS:  5 times sit to stand: 15 sec    TODAY'S TREATMENT: 05/24/22 Recumbent bike L1 x5 min full  revolutions  Supine: Heel slides with strap for knee flexion 2x10x3 sec hold  Prone: Knee hang for extension x2 min with 1.5# Hamstring curl 2x10 with 1.5# Quad set 2x10  Standing Side step red TB 2x8' Backwards stepping red TB   Manual therapy: Knee flexion with towel grade II to III Ktape for hamstring stability and edema 2 "I" strips on medial and lateral portions    05/20/22 Recumbent bike L1 x5 min full revolutions  Supine: Heel slides with strap for knee flexion 2x10x3 sec hold  Quadruped: Rock forward/back hips to heels x10 Hamstring curl 2x10 Hip ext 2x10 Fire hydrant 2x10  Manual therapy: Knee flexion with towel grade II to III   05/17/22 Nustep L6 x 5 min  Standing: Heel raise 2 x 10 HS curl 2 x 10 Forward step up 6'' step x 10 1 UE support, then x 10 4'' step Lateral step ups 2 x 10 4'' step 1 UE support Marching 2 x 10  Seated: LAQ 2# 2 x 10 Heel slide for knee flexion x 10 HS stretch x 30 sec  Supine: SLR 2 x 10 with quad set 2#  Prone: HS curl 2 x 10     PATIENT EDUCATION:  Education details: Exam findings, gait pattern, things to add to current HEP Person educated: Patient Education method: Explanation, Demonstration, and Handouts Education comprehension: verbalized understanding and returned demonstration   HOME EXERCISE PROGRAM: Access Code: [email or web address]   ASSESSMENT:  CLINICAL IMPRESSION: Pt with continued improvements in ROM. Provided taping for hamstrings as she appears to be developing some tendonitis/inflammation along hamstring insertion. Continued strengthening.    GOALS: Goals reviewed with patient? Yes   LONG TERM GOALS: Target date: 06/06/2022   Pt will be ind with advancing HEP Baseline:  Goal status: IN PROGRESS  2.  Pt will demo 0 to 120 deg L knee ROM for improved stair negotiation Baseline: 8 to 85 deg Goal status: IN PROGRESS  3.  Pt will be able to amb at least 1 mile independently Baseline: Using  cane in the house Goal status: IN PROGRESS  4.  Pt will be able to perform 5x STS in </=13 sec to demo increased functional LE strength Baseline: 15 sec Goal status: IN PROGRESS  5.  Pt will have improved FOTO to  76 Baseline: 47 Goal status: IN PROGRESS    PLAN: PT FREQUENCY: 2x/week  PT DURATION: 6 weeks  PLANNED INTERVENTIONS: Therapeutic exercises, Therapeutic activity, Neuromuscular re-education, Balance training, Gait training, Patient/Family education, Self Care, Joint mobilization, Stair training, Aquatic Therapy, Dry Needling, Electrical stimulation, Cryotherapy, scar mobilization, Taping, Vasopneumatic device, Ionotophoresis 77m/ml Dexamethasone, Manual therapy, and Re-evaluation  PLAN FOR NEXT SESSION:  Continue to work on knee ROM and hip/knee strengthening.   Amarylis Rovito April Ma L Hogan Hoobler, PT, DPT 05/24/2022, 10:19 AM

## 2022-05-26 ENCOUNTER — Encounter: Payer: Self-pay | Admitting: Physical Therapy

## 2022-05-26 ENCOUNTER — Ambulatory Visit: Payer: Medicare Other | Admitting: Physical Therapy

## 2022-05-26 DIAGNOSIS — M25562 Pain in left knee: Secondary | ICD-10-CM | POA: Diagnosis not present

## 2022-05-26 DIAGNOSIS — G8929 Other chronic pain: Secondary | ICD-10-CM

## 2022-05-26 DIAGNOSIS — R262 Difficulty in walking, not elsewhere classified: Secondary | ICD-10-CM

## 2022-05-26 DIAGNOSIS — M6281 Muscle weakness (generalized): Secondary | ICD-10-CM

## 2022-05-26 DIAGNOSIS — M25662 Stiffness of left knee, not elsewhere classified: Secondary | ICD-10-CM

## 2022-05-26 DIAGNOSIS — R600 Localized edema: Secondary | ICD-10-CM

## 2022-05-26 NOTE — Therapy (Signed)
OUTPATIENT PHYSICAL THERAPY AND PROGRESS NOTE  Progress Note Reporting Period 04/25/22 to 05/26/22  See note below for Objective Data and Assessment of Progress/Goals.      Patient Name: Tammy Mills MRN: [id number] DOB:[birth year], 66 y.o., female Today's Date: 05/26/2022   PT End of Session - 05/26/22 1156     Visit Number 10    Number of Visits 12    Date for PT Re-Evaluation 06/06/22    Authorization Type Medicare    Authorization - Visit Number 10    Progress Note Due on Visit 10    PT Start Time 1150    PT Stop Time 1230    PT Time Calculation (min) 40 min    Activity Tolerance Patient tolerated treatment well    Behavior During Therapy WFL for tasks assessed/performed                Past Medical History:  Diagnosis Date   Mitral valve prolapse    mild to moderate regurgitation   Osteoporosis    Past Surgical History:  Procedure Laterality Date   BREAST EXCISIONAL BIOPSY Right    20 + yrs ago, benign   BREAST SURGERY     COLONOSCOPY     TOTAL KNEE ARTHROPLASTY Left 04/01/2022   Procedure: LEFT TOTAL KNEE ARTHROPLASTY;  Surgeon: Mcarthur Rossetti, MD;  Location: [facility];  Service: Orthopedics;  Laterality: Left;   WISDOM TOOTH EXTRACTION     Patient Active Problem List   Diagnosis Date Noted   Status post total left knee replacement 04/01/2022   Unilateral primary osteoarthritis, left knee 03/31/2022   Osteoporosis    Mitral valve prolapse    Osteopenia 05/07/2013   Routine general medical examination at a health care facility 04/30/2013   Right knee pain 05/08/2012   Right hip pain 05/08/2012   Knee pain, bilateral 06/03/2011   WEIGHT GAIN 06/28/2010    PCP: Jenna Luo  REFERRING PROVIDER: Pete Pelt, PA-C   REFERRING DIAG: status post left total knee arthroplasty 03/24/2022   THERAPY DIAG:  Chronic pain of left knee  Stiffness of left knee, not elsewhere classified  Muscle weakness (generalized)  Localized  edema  Difficulty in walking, not elsewhere classified  Rationale for Evaluation and Treatment Rehabilitation  ONSET DATE: 03/24/22  SUBJECTIVE:   SUBJECTIVE STATEMENT: Pt states she had to go up/down flights of steps multiple times today. No issues with ascending step over step; notes issues with descending.   PERTINENT HISTORY: N/a  PAIN:  Are you having pain? No  PRECAUTIONS: None  WEIGHT BEARING RESTRICTIONS No  FALLS:  Has patient fallen in last 6 months? No  LIVING ENVIRONMENT: Lives with: lives with their family (2 sisters) Lives in: House/apartment Stairs: Yes: Internal: 14 steps; on right going up Has stair lift Has following equipment at home: Single point cane and Walker - 2 wheeled (uses cane in the house)  OCCUPATION: Retired  PLOF: Independent  PATIENT GOALS Walk dog; goal for 3 miles of walking a day   OBJECTIVE:   (FROM EVAL) PATIENT SURVEYS:  FOTO 47; predicted 76  POSTURE: R knee valgus noted  PALPATION: TTP L medial > lateral knee due to edema  LOWER EXTREMITY ROM:  ROM Right eval Left eval Left 9/5 Left 9/12 Left 9/15 Left 9/19 Left 9/21  Hip flexion         Hip extension         Hip abduction         Hip  adduction         Hip internal rotation         Hip external rotation         Knee flexion 118 deg AROM  82 deg AROM 87 deg PROM 97 AROM 100 AROM 106 PROM with strap sup 115 with strap supine 120 PROM 110 AROM  Knee extension -20 deg  AROM -8 deg PROM -25 deg AROM -7 deg PROM -6 AROM -4 PROM -4 AROM  0 in prone with knee hanging 0 PROM and AROM  Ankle dorsiflexion         Ankle plantarflexion         Ankle inversion         Ankle eversion          (Blank rows = not tested)  LOWER EXTREMITY MMT:  MMT Right eval Left eval  Hip flexion 5/5 5/5  Hip extension 4-/5 4-/5  Hip abduction 3+/5 3-/5  Hip adduction    Hip internal rotation    Hip external rotation    Knee flexion 4/5 3+/5  Knee extension 4-/5 4/5   Ankle dorsiflexion    Ankle plantarflexion    Ankle inversion    Ankle eversion     (Blank rows = not tested)   FUNCTIONAL TESTS:  5 times sit to stand: 15 sec  on eval    13 sec on 9/21    TODAY'S TREATMENT: 05/26/22 Recumbent bike L1 x 5 min full revolutions  Supine: Heel slides with strap for knee flexion 10x3 sec hold  Prone: Quad stretch with strap 2x30 sec Hip extension 2x10  Sidelying: Hip abduction 2x10 with 1.5#  Standing  On 4" step eccentric step down x10 with bilat UE support On 6" step eccentric step down x10 with bilat UE support  Manual therapy: Knee flexion with towel grade II to III; knee flexion PROM with overpressure   05/24/22 Recumbent bike L1 x5 min full revolutions  Supine: Heel slides with strap for knee flexion 2x10x3 sec hold  Prone: Knee hang for extension x2 min with 1.5# Hamstring curl 2x10 with 1.5# Quad set 2x10  Standing Side step red TB 2x8' Backwards stepping red TB   Manual therapy: Knee flexion with towel grade II to III Ktape for hamstring stability and edema 2 "I" strips on medial and lateral portions    05/20/22 Recumbent bike L1 x5 min full revolutions  Supine: Heel slides with strap for knee flexion 2x10x3 sec hold  Quadruped: Rock forward/back hips to heels x10 Hamstring curl 2x10 Hip ext 2x10 Fire hydrant 2x10  Manual therapy: Knee flexion with towel grade II to III    PATIENT EDUCATION:  Education details: Exam findings, gait pattern, things to add to current HEP Person educated: Patient Education method: Explanation, Demonstration, and Handouts Education comprehension: verbalized understanding and returned demonstration   HOME EXERCISE PROGRAM: Access Code: [email or web address]   ASSESSMENT:  CLINICAL IMPRESSION: Pt has reached her 5x sit<>stand goals. Has weak quad eccentric control. Worked on stairs to try and improve on this. Passively, pt is able to obtain 120 deg but not yet able to obtain  actively. Continued strengthening hip. Pt continues to make good progress with therapy. Now able to amb in community with her cane.    GOALS: Goals reviewed with patient? Yes   LONG TERM GOALS: Target date: 06/06/2022   Pt will be ind with advancing HEP Baseline:  Goal status: IN PROGRESS  2.  Pt will demo 0 to  120 deg L knee ROM for improved stair negotiation Baseline: 8 to 85 deg Goal status: IN PROGRESS  3.  Pt will be able to amb at least 1 mile independently Baseline: Using cane in the house Goal status: MET  4.  Pt will be able to perform 5x STS in </=13 sec to demo increased functional LE strength Baseline: 15 sec Goal status: MET  5.  Pt will have improved FOTO to 76 Baseline: 47 Goal status: IN PROGRESS    PLAN: PT FREQUENCY: 2x/week  PT DURATION: 6 weeks  PLANNED INTERVENTIONS: Therapeutic exercises, Therapeutic activity, Neuromuscular re-education, Balance training, Gait training, Patient/Family education, Self Care, Joint mobilization, Stair training, Aquatic Therapy, Dry Needling, Electrical stimulation, Cryotherapy, scar mobilization, Taping, Vasopneumatic device, Ionotophoresis 59m/ml Dexamethasone, Manual therapy, and Re-evaluation  PLAN FOR NEXT SESSION:  Continue to work on knee ROM and hip/knee strengthening.   Wade Asebedo April Ma L Celestine Prim, PT, DPT 05/26/2022, 11:57 AM

## 2022-05-30 ENCOUNTER — Other Ambulatory Visit (HOSPITAL_BASED_OUTPATIENT_CLINIC_OR_DEPARTMENT_OTHER): Payer: Self-pay

## 2022-05-30 MED ORDER — FLUAD QUADRIVALENT 0.5 ML IM PRSY
PREFILLED_SYRINGE | INTRAMUSCULAR | 0 refills | Status: DC
  Filled 2022-05-30: qty 0.5, 1d supply, fill #0

## 2022-05-31 ENCOUNTER — Other Ambulatory Visit: Payer: Self-pay | Admitting: Orthopaedic Surgery

## 2022-05-31 ENCOUNTER — Encounter: Payer: Self-pay | Admitting: Physical Therapy

## 2022-05-31 ENCOUNTER — Ambulatory Visit: Payer: Medicare Other | Admitting: Physical Therapy

## 2022-05-31 DIAGNOSIS — M25662 Stiffness of left knee, not elsewhere classified: Secondary | ICD-10-CM

## 2022-05-31 DIAGNOSIS — M6281 Muscle weakness (generalized): Secondary | ICD-10-CM

## 2022-05-31 DIAGNOSIS — M25562 Pain in left knee: Secondary | ICD-10-CM | POA: Diagnosis not present

## 2022-05-31 DIAGNOSIS — G8929 Other chronic pain: Secondary | ICD-10-CM

## 2022-05-31 DIAGNOSIS — R262 Difficulty in walking, not elsewhere classified: Secondary | ICD-10-CM

## 2022-05-31 NOTE — Therapy (Signed)
OUTPATIENT PHYSICAL THERAPY       Patient Name: Tammy Mills MRN: [id number] DOB:[birth year], 66 y.o., female Today's Date: 05/31/2022   PT End of Session - 05/31/22 1139     Visit Number 11    Number of Visits 12    Date for PT Re-Evaluation 06/06/22    Authorization - Visit Number 11    Progress Note Due on Visit 20    PT Start Time 1100    PT Stop Time 1141    PT Time Calculation (min) 41 min    Activity Tolerance Patient tolerated treatment well    Behavior During Therapy WFL for tasks assessed/performed                 Past Medical History:  Diagnosis Date   Mitral valve prolapse    mild to moderate regurgitation   Osteoporosis    Past Surgical History:  Procedure Laterality Date   BREAST EXCISIONAL BIOPSY Right    20 + yrs ago, benign   BREAST SURGERY     COLONOSCOPY     TOTAL KNEE ARTHROPLASTY Left 04/01/2022   Procedure: LEFT TOTAL KNEE ARTHROPLASTY;  Surgeon: Mcarthur Rossetti, MD;  Location: [facility];  Service: Orthopedics;  Laterality: Left;   WISDOM TOOTH EXTRACTION     Patient Active Problem List   Diagnosis Date Noted   Status post total left knee replacement 04/01/2022   Unilateral primary osteoarthritis, left knee 03/31/2022   Osteoporosis    Mitral valve prolapse    Osteopenia 05/07/2013   Routine general medical examination at a health care facility 04/30/2013   Right knee pain 05/08/2012   Right hip pain 05/08/2012   Knee pain, bilateral 06/03/2011   WEIGHT GAIN 06/28/2010    PCP: Jenna Luo  REFERRING PROVIDER: Pete Pelt, PA-C   REFERRING DIAG: status post left total knee arthroplasty 03/24/2022   THERAPY DIAG:  Chronic pain of left knee  Stiffness of left knee, not elsewhere classified  Muscle weakness (generalized)  Difficulty in walking, not elsewhere classified  Rationale for Evaluation and Treatment Rehabilitation  ONSET DATE: 03/24/22  SUBJECTIVE:   SUBJECTIVE STATEMENT: Pt states she is now able  to do reciprocal pattern up and down stairs. She is walking about 1 mile a day. She is not using the cane in the house  PERTINENT HISTORY: N/a  PAIN:  Are you having pain? No  PRECAUTIONS: None  WEIGHT BEARING RESTRICTIONS No  FALLS:  Has patient fallen in last 6 months? No  LIVING ENVIRONMENT: Lives with: lives with their family (2 sisters) Lives in: House/apartment Stairs: Yes: Internal: 14 steps; on right going up Has stair lift Has following equipment at home: Single point cane and Walker - 2 wheeled (uses cane in the house)  OCCUPATION: Retired  PLOF: Independent  PATIENT GOALS Walk dog; goal for 3 miles of walking a day   OBJECTIVE:   (FROM EVAL) PATIENT SURVEYS:  FOTO 47; predicted 76  POSTURE: R knee valgus noted  PALPATION: TTP L medial > lateral knee due to edema  LOWER EXTREMITY ROM:  ROM Right eval Left eval Left 9/5 Left 9/12 Left 9/15 Left 9/19 Left 9/21  Hip flexion         Hip extension         Hip abduction         Hip adduction         Hip internal rotation         Hip external rotation  Knee flexion 118 deg AROM  82 deg AROM 87 deg PROM 97 AROM 100 AROM 106 PROM with strap sup 115 with strap supine 120 PROM 110 AROM  Knee extension -20 deg  AROM -8 deg PROM -25 deg AROM -7 deg PROM -6 AROM -4 PROM -4 AROM  0 in prone with knee hanging 0 PROM and AROM  Ankle dorsiflexion         Ankle plantarflexion         Ankle inversion         Ankle eversion          (Blank rows = not tested)  LOWER EXTREMITY MMT:  MMT Right eval Left eval  Hip flexion 5/5 5/5  Hip extension 4-/5 4-/5  Hip abduction 3+/5 3-/5  Hip adduction    Hip internal rotation    Hip external rotation    Knee flexion 4/5 3+/5  Knee extension 4-/5 4/5  Ankle dorsiflexion    Ankle plantarflexion    Ankle inversion    Ankle eversion     (Blank rows = not tested)   FUNCTIONAL TESTS:  5 times sit to stand: 15 sec  on eval    13 sec on  9/21    TODAY'S TREATMENT: 05/31/22 Recumbent bike L1 x 5 min full revolutions  Runners step ups 6'' step 1 UE support 3 x 10 Leg press 5 plates seat 6 3 X91 Side step red TB 20' x 4 Straddle step up 6'' step x 20 Attempted straddle squats - increased knee pain - tried standing wt shifts in wide leg stance with good tolerance 2 x 10 bilat  Sidelying hip abd 1.5# Lt LE Clam Lt 3 x 10   05/26/22 Recumbent bike L1 x 5 min full revolutions  Supine: Heel slides with strap for knee flexion 10x3 sec hold  Prone: Quad stretch with strap 2x30 sec Hip extension 2x10  Sidelying: Hip abduction 2x10 with 1.5#  Standing  On 4" step eccentric step down x10 with bilat UE support On 6" step eccentric step down x10 with bilat UE support  Manual therapy: Knee flexion with towel grade II to III; knee flexion PROM with overpressure   05/24/22 Recumbent bike L1 x5 min full revolutions  Supine: Heel slides with strap for knee flexion 2x10x3 sec hold  Prone: Knee hang for extension x2 min with 1.5# Hamstring curl 2x10 with 1.5# Quad set 2x10  Standing Side step red TB 2x8' Backwards stepping red TB   Manual therapy: Knee flexion with towel grade II to III Ktape for hamstring stability and edema 2 "I" strips on medial and lateral portions    PATIENT EDUCATION:  Education details: Exam findings, gait pattern, things to add to current HEP Person educated: Patient Education method: Explanation, Demonstration, and Handouts Education comprehension: verbalized understanding and returned demonstration   HOME EXERCISE PROGRAM: Access Code: [email or web address]   ASSESSMENT:  CLINICAL IMPRESSION: Increased reps of exercises to continue to improve endurance. Some increased pain with lateral wt shifts so added wt shifting in pain free range to improve knee stability. Discussed importance of stopping and walking and stretching every 2 hours during her drive to vacation   GOALS: Goals  reviewed with patient? Yes   LONG TERM GOALS: Target date: 06/06/2022   Pt will be ind with advancing HEP Baseline:  Goal status: IN PROGRESS  2.  Pt will demo 0 to 120 deg L knee ROM for improved stair negotiation Baseline: 8 to 85 deg Goal  status: MET  3.  Pt will be able to amb at least 1 mile independently Baseline: Using cane in the house Goal status: MET  4.  Pt will be able to perform 5x STS in </=13 sec to demo increased functional LE strength Baseline: 15 sec Goal status: MET  5.  Pt will have improved FOTO to 76 Baseline: 47 Goal status: IN PROGRESS    PLAN: PT FREQUENCY: 2x/week  PT DURATION: 6 weeks  PLANNED INTERVENTIONS: Therapeutic exercises, Therapeutic activity, Neuromuscular re-education, Balance training, Gait training, Patient/Family education, Self Care, Joint mobilization, Stair training, Aquatic Therapy, Dry Needling, Electrical stimulation, Cryotherapy, scar mobilization, Taping, Vasopneumatic device, Ionotophoresis 26m/ml Dexamethasone, Manual therapy, and Re-evaluation  PLAN FOR NEXT SESSION:  Continue to work on knee ROM and hip/knee strengthening and endurance   Ren Grasse, PT, DPT 05/31/2022, 11:41 AM

## 2022-06-03 ENCOUNTER — Encounter: Payer: Self-pay | Admitting: Physical Therapy

## 2022-06-03 ENCOUNTER — Ambulatory Visit: Payer: Medicare Other | Admitting: Physical Therapy

## 2022-06-03 DIAGNOSIS — M25562 Pain in left knee: Secondary | ICD-10-CM | POA: Diagnosis not present

## 2022-06-03 DIAGNOSIS — R262 Difficulty in walking, not elsewhere classified: Secondary | ICD-10-CM

## 2022-06-03 DIAGNOSIS — R600 Localized edema: Secondary | ICD-10-CM

## 2022-06-03 DIAGNOSIS — G8929 Other chronic pain: Secondary | ICD-10-CM

## 2022-06-03 DIAGNOSIS — M6281 Muscle weakness (generalized): Secondary | ICD-10-CM

## 2022-06-03 DIAGNOSIS — M25662 Stiffness of left knee, not elsewhere classified: Secondary | ICD-10-CM

## 2022-06-03 NOTE — Therapy (Signed)
OUTPATIENT PHYSICAL THERAPY AND DISCHARGE       Patient Name: Tammy Mills MRN: [id number] DOB:[birth year], 66 y.o., female Today's Date: 06/03/2022   PT End of Session - 06/03/22 1102     Visit Number 12    Number of Visits 12    Date for PT Re-Evaluation 06/06/22    Authorization - Visit Number 12    Progress Note Due on Visit 20    PT Start Time 1103    PT Stop Time 1145    PT Time Calculation (min) 42 min    Activity Tolerance Patient tolerated treatment well    Behavior During Therapy WFL for tasks assessed/performed             PHYSICAL THERAPY DISCHARGE SUMMARY  Visits from Start of Care: 12  Current functional level related to goals / functional outcomes: See below   Remaining deficits: See below   Education / Equipment: See below   Patient agrees to discharge. Patient goals were met. Patient is being discharged due to meeting the stated rehab goals.      Past Medical History:  Diagnosis Date   Mitral valve prolapse    mild to moderate regurgitation   Osteoporosis    Past Surgical History:  Procedure Laterality Date   BREAST EXCISIONAL BIOPSY Right    20 + yrs ago, benign   BREAST SURGERY     COLONOSCOPY     TOTAL KNEE ARTHROPLASTY Left 04/01/2022   Procedure: LEFT TOTAL KNEE ARTHROPLASTY;  Surgeon: Mcarthur Rossetti, MD;  Location: [facility];  Service: Orthopedics;  Laterality: Left;   WISDOM TOOTH EXTRACTION     Patient Active Problem List   Diagnosis Date Noted   Status post total left knee replacement 04/01/2022   Unilateral primary osteoarthritis, left knee 03/31/2022   Osteoporosis    Mitral valve prolapse    Osteopenia 05/07/2013   Routine general medical examination at a health care facility 04/30/2013   Right knee pain 05/08/2012   Right hip pain 05/08/2012   Knee pain, bilateral 06/03/2011   WEIGHT GAIN 06/28/2010    PCP: Jenna Luo  REFERRING PROVIDER: Pete Pelt, PA-C   REFERRING DIAG: status post left  total knee arthroplasty 03/24/2022   THERAPY DIAG:  Chronic pain of left knee  Stiffness of left knee, not elsewhere classified  Muscle weakness (generalized)  Difficulty in walking, not elsewhere classified  Localized edema  Rationale for Evaluation and Treatment Rehabilitation  ONSET DATE: 03/24/22  SUBJECTIVE:   SUBJECTIVE STATEMENT: Pt reports noting some sharp pain with lateral movements in the kitchen -- not exactly sure of the movement but can feel it going down her left leg. Not sure if it's her back or hip. Has continued to walk and do the stairs. Pt will be going to [place] next week for vacation.   PERTINENT HISTORY: N/a  PAIN:  Are you having pain? No  PRECAUTIONS: None  WEIGHT BEARING RESTRICTIONS No  FALLS:  Has patient fallen in last 6 months? No  LIVING ENVIRONMENT: Lives with: lives with their family (2 sisters) Lives in: House/apartment Stairs: Yes: Internal: 14 steps; on right going up Has stair lift Has following equipment at home: Single point cane and Walker - 2 wheeled (uses cane in the house)  OCCUPATION: Retired  PLOF: Independent  PATIENT GOALS Walk dog; goal for 3 miles of walking a day   OBJECTIVE:   (FROM EVAL) PATIENT SURVEYS:  FOTO 47; predicted 76  POSTURE: R knee valgus  noted  PALPATION: TTP L medial > lateral knee due to edema  LOWER EXTREMITY ROM:  ROM Right eval Left eval Left 9/5 Left 9/12 Left 9/15 Left 9/19 Left 9/21 Left 9/29  Hip flexion          Hip extension          Hip abduction          Hip adduction          Hip internal rotation          Hip external rotation          Knee flexion 118 deg AROM  82 deg AROM 87 deg PROM 97 AROM 100 AROM 106 PROM with strap sup 115 with strap supine 120 PROM 110 AROM 115 AROM  Knee extension -20 deg  AROM -8 deg PROM -25 deg AROM -7 deg PROM -6 AROM -4 PROM -4 AROM  0 in prone with knee hanging 0 PROM and AROM 0 AROM  Ankle dorsiflexion          Ankle  plantarflexion          Ankle inversion          Ankle eversion           (Blank rows = not tested)  LOWER EXTREMITY MMT:  MMT Right eval Left eval Right 9/29 Left 9/29  Hip flexion 5/5 5/'5 5 5  ' Hip extension 4-/5 4-/'5 4 5  ' Hip abduction 3+/5 3-/5 3+ 4-  Hip adduction      Hip internal rotation      Hip external rotation      Knee flexion 4/5 3+/'5 5 5  ' Knee extension 4-/5 4/'5 5 5  ' Ankle dorsiflexion      Ankle plantarflexion      Ankle inversion      Ankle eversion       (Blank rows = not tested)   FUNCTIONAL TESTS:  5 times sit to stand: 15 sec  on eval    13 sec on 9/21    TODAY'S TREATMENT: 06/03/22 Recumbent bike L1 x 5 min full revolutions  Standing Side lunge L x10 Side stepping x 2 reps by counter [place] x 3 reps by counter Lumbar extensions x10 with reduced symptoms  Sidelying Hip abd x5  05/31/22 Recumbent bike L1 x 5 min full revolutions  Runners step ups 6'' step 1 UE support 3 x 10 Leg press 5 plates seat 6 3 J28 Side step red TB 20' x 4 Straddle step up 6'' step x 20 Attempted straddle squats - increased knee pain - tried standing wt shifts in wide leg stance with good tolerance 2 x 10 bilat  Sidelying hip abd 1.5# Lt LE Clam Lt 3 x 10   05/26/22 Recumbent bike L1 x 5 min full revolutions  Supine: Heel slides with strap for knee flexion 10x3 sec hold  Prone: Quad stretch with strap 2x30 sec Hip extension 2x10  Sidelying: Hip abduction 2x10 with 1.5#  Standing  On 4" step eccentric step down x10 with bilat UE support On 6" step eccentric step down x10 with bilat UE support  Manual therapy: Knee flexion with towel grade II to III; knee flexion PROM with overpressure    PATIENT EDUCATION:  Education details: Final HEP, discussed different things to try if she feels the radiating pain down her L leg Person educated: Patient Education method: Explanation, Demonstration, and Handouts Education comprehension: verbalized  understanding and returned demonstration   HOME  EXERCISE PROGRAM: Access Code: [email or web address]   ASSESSMENT:  CLINICAL IMPRESSION: Pain down left leg appears to be coming from her back. Improved after performing lumbar extensions at counter. Worked on re-checking goals and finalizing HEP. Pt is knowledgeable on what she needs to continue to work on and feels able to continue her HEP at home. Pt has full knee PROM to 120 deg, AROM is still not quite there. Pt feels ready for d/c.    GOALS: Goals reviewed with patient? Yes   LONG TERM GOALS: Target date: 06/06/2022   Pt will be ind with advancing HEP Baseline:  Goal status: MET  2.  Pt will demo 0 to 120 deg L knee ROM for improved stair negotiation Baseline: 8 to 85 deg Goal status: MET  3.  Pt will be able to amb at least 1 mile independently Baseline: Using cane in the house Goal status: MET  4.  Pt will be able to perform 5x STS in </=13 sec to demo increased functional LE strength Baseline: 15 sec Goal status: MET  5.  Pt will have improved FOTO to 76 Baseline: 72 on visit 12; predicted 76 on visit 17 Goal status: Partially met    PLAN: PT FREQUENCY: 2x/week  PT DURATION: 6 weeks  PLANNED INTERVENTIONS: Therapeutic exercises, Therapeutic activity, Neuromuscular re-education, Balance training, Gait training, Patient/Family education, Self Care, Joint mobilization, Stair training, Aquatic Therapy, Dry Needling, Electrical stimulation, Cryotherapy, scar mobilization, Taping, Vasopneumatic device, Ionotophoresis 39m/ml Dexamethasone, Manual therapy, and Re-evaluation  PLAN FOR NEXT SESSION:  Continue to work on knee ROM and hip/knee strengthening and endurance   Jacki Couse April Ma L Laurier Jasperson, PT, DPT 06/03/2022, 11:03 AM

## 2022-06-04 ENCOUNTER — Encounter: Payer: Self-pay | Admitting: Family Medicine

## 2022-06-16 ENCOUNTER — Encounter: Payer: Self-pay | Admitting: Physician Assistant

## 2022-06-16 ENCOUNTER — Ambulatory Visit (INDEPENDENT_AMBULATORY_CARE_PROVIDER_SITE_OTHER): Payer: Medicare Other | Admitting: Physician Assistant

## 2022-06-16 DIAGNOSIS — Z96652 Presence of left artificial knee joint: Secondary | ICD-10-CM | POA: Diagnosis not present

## 2022-06-16 DIAGNOSIS — M7062 Trochanteric bursitis, left hip: Secondary | ICD-10-CM | POA: Diagnosis not present

## 2022-06-16 MED ORDER — LIDOCAINE HCL 1 % IJ SOLN
3.0000 mL | INTRAMUSCULAR | Status: AC | PRN
  Administered 2022-06-16: 3 mL

## 2022-06-16 MED ORDER — METHYLPREDNISOLONE ACETATE 40 MG/ML IJ SUSP
40.0000 mg | INTRAMUSCULAR | Status: AC | PRN
  Administered 2022-06-16: 40 mg via INTRA_ARTICULAR

## 2022-06-16 NOTE — Progress Notes (Signed)
HPI: Tammy Mills returns today status post left total knee arthroplasty 04/01/2022.  States knee is doing okay she has some clicking whenever she is going up stairs.  She is working with physical therapy.  She states that she has developed some left lateral hip pain.  Pain started about a week ago.  Occasionally has some radicular symptoms down the leg and some back pain.  She has had no falls or injuries.  Review of systems: See HPI otherwise negative or noncontributory  Physical exam: General well-developed well-nourished female no acute distress mood and affect appropriate Ambulates without any assistive device. Left hip tenderness over the trochanteric region and down the IT band with palpation.  Left knee surgical incisions well-healed no signs of infection.  Full extension full flexion.  Passive range of motion reveals a slight click with flexion extension.  Bilateral VMO atrophy.  Impression: Status post left total knee arthroplasty 04/01/2022 Left hip trochanteric bursitis  Plan: Discussed with her avoidance of abduction exercises.  She needs to work on quad strengthening reviewed this with her.  Reviewed IT band stretching exercises with her.  She will follow-up with us in 3 months AP and lateral views of the left knee at that time.  She will follow-up sooner if there is any questions concerns.  Offered her trochanteric injection today left hip and she was in favor of this.      Procedure Note  Patient: Tammy Mills             Date of Birth: 02/20/1956           MRN: [id number]             Visit Date: 06/16/2022  Procedures: Visit Diagnoses:  1. Status post total left knee replacement   2. Trochanteric bursitis, left hip     Large Joint Inj: L greater trochanter on 06/16/2022 1:31 PM Indications: pain Details: 22 G 1.5 in needle, lateral approach  Arthrogram: No  Medications: 3 mL lidocaine 1 %; 40 mg methylPREDNISolone acetate 40 MG/ML Outcome: tolerated well, no immediate  complications Procedure, treatment alternatives, risks and benefits explained, specific risks discussed. Consent was given by the patient. Immediately prior to procedure a time out was called to verify the correct patient, procedure, equipment, support staff and site/side marked as required. Patient was prepped and draped in the usual sterile fashion.          

## 2022-06-28 ENCOUNTER — Other Ambulatory Visit: Payer: Self-pay | Admitting: Physician Assistant

## 2022-06-28 MED ORDER — DOXYCYCLINE HYCLATE 100 MG PO TABS: 100.0000 mg | ORAL_TABLET | Freq: Two times a day (BID) | ORAL | 0 refills | Status: AC

## 2022-07-11 ENCOUNTER — Other Ambulatory Visit: Payer: Self-pay | Admitting: Orthopaedic Surgery

## 2022-08-02 ENCOUNTER — Other Ambulatory Visit (HOSPITAL_BASED_OUTPATIENT_CLINIC_OR_DEPARTMENT_OTHER): Payer: Self-pay

## 2022-08-02 MED ORDER — AREXVY 120 MCG/0.5ML IM SUSR
INTRAMUSCULAR | 0 refills | Status: DC
  Filled 2022-08-02: qty 0.5, 1d supply, fill #0

## 2022-08-02 MED ORDER — COMIRNATY 30 MCG/0.3ML IM SUSY
PREFILLED_SYRINGE | INTRAMUSCULAR | 0 refills | Status: DC
  Filled 2022-08-02: qty 0.3, 1d supply, fill #0

## 2022-08-11 ENCOUNTER — Ambulatory Visit (INDEPENDENT_AMBULATORY_CARE_PROVIDER_SITE_OTHER): Payer: Medicare Other

## 2022-08-11 DIAGNOSIS — M810 Age-related osteoporosis without current pathological fracture: Secondary | ICD-10-CM | POA: Diagnosis not present

## 2022-08-11 MED ORDER — DENOSUMAB 60 MG/ML ~~LOC~~ SOSY
60.0000 mg | PREFILLED_SYRINGE | Freq: Once | SUBCUTANEOUS | Status: AC
  Administered 2022-08-11: 60 mg via SUBCUTANEOUS

## 2022-09-06 ENCOUNTER — Other Ambulatory Visit: Payer: Self-pay | Admitting: Orthopaedic Surgery

## 2022-09-19 ENCOUNTER — Ambulatory Visit (INDEPENDENT_AMBULATORY_CARE_PROVIDER_SITE_OTHER): Payer: Medicare Other

## 2022-09-19 ENCOUNTER — Ambulatory Visit (INDEPENDENT_AMBULATORY_CARE_PROVIDER_SITE_OTHER): Payer: Medicare Other | Admitting: Physician Assistant

## 2022-09-19 ENCOUNTER — Encounter: Payer: Self-pay | Admitting: Physician Assistant

## 2022-09-19 DIAGNOSIS — M1711 Unilateral primary osteoarthritis, right knee: Secondary | ICD-10-CM | POA: Diagnosis not present

## 2022-09-19 DIAGNOSIS — Z96652 Presence of left artificial knee joint: Secondary | ICD-10-CM | POA: Diagnosis not present

## 2022-09-19 NOTE — Progress Notes (Signed)
Office Visit Note   Patient: Tammy Mills           Date of Birth: [birth year]           MRN: [id number] Visit Date: 09/19/2022              Requested by: Susy Frizzle, MD [address] [place],  [place] [address] PCP: Susy Frizzle, MD   Assessment & Plan: Visit Diagnoses:  1. History of left knee replacement   2. Primary osteoarthritis of right knee     Plan: She will continue to work on range of motion strengthening left knee.  Will work on setting her up for right total knee arthroplasty in the near future.  She has end-stage arthritis involving the right knee with valgus deformity.  She is having mechanical symptoms of the right knee with giving way and catching.  She has trouble with daily activities including steps due to the pain in the right knee.  She understands the risk benefits of surgery and the fact that she underwent a left total knee arthroplasty 04/01/2022.  Risks were reviewed with patient.  Questions were encouraged and answered  Follow-Up Instructions: Return for post op.   Orders:  Orders Placed This Encounter  Procedures   XR Knee 1-2 Views Left   No orders of the defined types were placed in this encounter.     Procedures: No procedures performed   Clinical Data: No additional findings.   Subjective: Chief Complaint  Patient presents with   Left Knee - Follow-up    HPI [name] returns today for follow-up of her left total knee arthroplasty 04/01/2022.  She states her left knee is doing great.  She states she has good range of motion and strength.  She states her right knee is more bothersome.  She has known end-stage arthritis of the right knee with bone-on-bone lateral compartment and moderate patellofemoral arthritis.  She notes that the right knee pain 7 -8 out of 10 pain.  Knee catches occasionally gives way.  She has difficulty particularly with steps.  Review of Systems Denies any fevers chills or ongoing  infection.  Objective: Vital Signs: There were no vitals taken for this visit.  Physical Exam Constitutional:      Appearance: She is not ill-appearing or diaphoretic.  Pulmonary:     Effort: Pulmonary effort is normal.  Neurological:     Mental Status: She is alert and oriented to person, place, and time.  Psychiatric:        Mood and Affect: Mood normal.     Ortho Exam Left knee: Full range of motion of the left knee without pain.  No instability valgus varus stressing.  Surgical incisions well-healed.  Left calf supple nontender. Right knee: Valgus malalignment.  Tenderness lateral joint line.  Patellofemoral crepitus passive range of motion.  No instability valgus varus stressing.  Right calf supple nontender. Specialty Comments:  No specialty comments available.  Imaging: XR Knee 1-2 Views Left  Result Date: 09/19/2022 Left knee 2 views: Status post left total knee arthroplasty well-seated components.  No acute fractures or acute findings.  Knee is well located.    PMFS History: Patient Active Problem List   Diagnosis Date Noted   Status post total left knee replacement 04/01/2022   Unilateral primary osteoarthritis, left knee 03/31/2022   Osteoporosis    Mitral valve prolapse    Osteopenia 05/07/2013   Routine general medical examination at a health  care facility 04/30/2013   Right knee pain 05/08/2012   Right hip pain 05/08/2012   Knee pain, bilateral 06/03/2011   WEIGHT GAIN 06/28/2010   Past Medical History:  Diagnosis Date   Mitral valve prolapse    mild to moderate regurgitation   Osteoporosis     Family History  Problem Relation Age of Onset   Stroke Mother    Cancer Father        hogkins   Colon cancer Maternal Grandfather 47   Cancer Maternal Grandfather        colon   Hyperlipidemia Other    Cancer Paternal Grandmother        colon   Heart attack Neg Hx    Diabetes Neg Hx    Hypertension Neg Hx    Sudden death Neg Hx     Past Surgical  History:  Procedure Laterality Date   BREAST EXCISIONAL BIOPSY Right    20 + yrs ago, benign   BREAST SURGERY     COLONOSCOPY     TOTAL KNEE ARTHROPLASTY Left 04/01/2022   Procedure: LEFT TOTAL KNEE ARTHROPLASTY;  Surgeon: Mcarthur Rossetti, MD;  Location: [facility];  Service: Orthopedics;  Laterality: Left;   WISDOM TOOTH EXTRACTION     Social History   Occupational History   Not on file  Tobacco Use   Smoking status: Former    Types: Cigarettes    Quit date: 09/05/2005    Years since quitting: 17.0   Smokeless tobacco: Never  Vaping Use   Vaping Use: Never used  Substance and Sexual Activity   Alcohol use: Yes    Alcohol/week: 3.0 standard drinks of alcohol    Types: 1 Glasses of wine, 2 Cans of beer per week    Comment:  2 beers/ 1 glass of wine on weekends   Drug use: No   Sexual activity: Not on file

## 2022-11-01 ENCOUNTER — Other Ambulatory Visit: Payer: Self-pay | Admitting: Physician Assistant

## 2022-11-01 DIAGNOSIS — Z01818 Encounter for other preprocedural examination: Secondary | ICD-10-CM

## 2022-11-07 ENCOUNTER — Other Ambulatory Visit: Payer: Self-pay | Admitting: Orthopaedic Surgery

## 2022-11-10 ENCOUNTER — Encounter: Payer: Self-pay | Admitting: Radiology

## 2022-11-16 NOTE — Progress Notes (Addendum)
COVID Vaccine Completed:  Yes  Date of COVID positive in last 90 days:  No  PCP - Jenna Luo, MD Cardiologist - Sherren Mocha, MD  Chest x-ray - N/A EKG - 02-09-22 Epic Stress Test - N/A ECHO - 02-28-22 Epic Cardiac Cath - N/A Pacemaker/ICD device last checked: Spinal Cord Stimulator: N/A  Bowel Prep - N/A  Sleep Study - N/A CPAP -   Fasting Blood Sugar - N/A Checks Blood Sugar _____ times a day  Last dose of GLP1 agonist-  N/A GLP1 instructions:  N/A   Last dose of SGLT-2 inhibitors-  N/A SGLT-2 instructions: N/A  Blood Thinner Instructions: Aspirin Instructions:  N/A Last Dose:  Activity level:  Can go up a flight of stairs and perform activities of daily living without stopping and without symptoms of chest pain or shortness of breath.  Anesthesia review:  MVP with mild to mod mitral regurg Hx of syncope  Patient denies shortness of breath, fever, cough and chest pain at PAT appointment  Patient verbalized understanding of instructions that were given to them at the PAT appointment. Patient was also instructed that they will need to review over the PAT instructions again at home before surgery.

## 2022-11-16 NOTE — Patient Instructions (Addendum)
SURGICAL WAITING ROOM VISITATION Patients having surgery or a procedure may have no more than 2 support people in the waiting area - these visitors may rotate.    If the patient needs to stay at the hospital during part of their recovery, the visitor guidelines for inpatient rooms apply. Pre-op nurse will coordinate an appropriate time for 1 support person to accompany patient in pre-op.  This support person may not rotate.    Please refer to the [facility] website for the visitor guidelines for Inpatients (after your surgery is over and you are in a regular room).   Due to an increase in RSV and influenza rates and associated hospitalizations, children ages 22 and under may not visit patients in [place].     Your procedure is scheduled on: 11-25-22   Report to [facility] Main Entrance    Report to admitting at 7:30 AM   Call this number if you have problems the morning of surgery [phone]   Do not eat food :After Midnight.   After Midnight you may have the following liquids until 7:00 AM DAY OF SURGERY  Water Non-Citrus Juices (without pulp, NO RED) Carbonated Beverages Black Coffee (NO MILK/CREAM OR CREAMERS, sugar ok)  Clear Tea (NO MILK/CREAM OR CREAMERS, sugar ok) regular and decaf                             Plain Jell-O (NO RED)                                           Fruit ices (not with fruit pulp, NO RED)                                     Popsicles (NO RED)                                                               Sports drinks like Gatorade (NO RED)                   The day of surgery:  Drink ONE (1) Pre-Surgery Clear Ensure at 7:00 AM the morning of surgery. Drink in one sitting. Do not sip.  This drink was given to you during your hospital  pre-op appointment visit. Nothing else to drink after completing the Pre-Surgery Clear Ensure.          If you have questions, please contact your surgeon's office.   FOLLOW  ANY  ADDITIONAL PRE OP INSTRUCTIONS YOU RECEIVED FROM YOUR SURGEON'S OFFICE!!!     Oral Hygiene is also important to reduce your risk of infection.                                    Remember - BRUSH YOUR TEETH THE MORNING OF SURGERY WITH YOUR REGULAR TOOTHPASTE   Do NOT smoke after Midnight   Take these medicines the morning of surgery with A SIP OF WATER:  Pepcid  Claritin                              You may not have any metal on your body including hair pins, jewelry, and body piercing             Do not wear make-up, lotions, powders, perfumes or deodorant  Do not wear nail polish including gel and S&S, artificial/acrylic nails, or any other type of covering on natural nails including finger and toenails. If you have artificial nails, gel coating, etc. that needs to be removed by a nail salon please have this removed prior to surgery or surgery may need to be canceled/ delayed if the surgeon/ anesthesia feels like they are unable to be safely monitored.   Do not shave  48 hours prior to surgery.    Do not bring valuables to the hospital. [place].   Contacts, dentures or bridgework may not be worn into surgery.   Bring small overnight bag day of surgery.   DO NOT [place]. PHARMACY WILL DISPENSE MEDICATIONS LISTED ON YOUR MEDICATION LIST TO YOU DURING YOUR ADMISSION [place]!              Please read over the following fact sheets you were given: IF [place]  If you received a COVID test during your pre-op visit  it is requested that you wear a mask when out in public, stay away from anyone that may not be feeling well and notify your surgeon if you develop symptoms. If you test positive for Covid or have been in contact with anyone that has tested positive in the last 10 days please notify you surgeon.  [place] - Preparing for  Surgery Before surgery, you can play an important role.  Because skin is not sterile, your skin needs to be as free of germs as possible.  You can reduce the number of germs on your skin by washing with CHG (chlorahexidine gluconate) soap before surgery.  CHG is an antiseptic cleaner which kills germs and bonds with the skin to continue killing germs even after washing. Please DO NOT use if you have an allergy to CHG or antibacterial soaps.  If your skin becomes reddened/irritated stop using the CHG and inform your nurse when you arrive at Short Stay. Do not shave (including legs and underarms) for at least 48 hours prior to the first CHG shower.  You may shave your face/neck.  Please follow these instructions carefully:  1.  Shower with CHG Soap the night before surgery and the  morning of surgery.  2.  If you choose to wash your hair, wash your hair first as usual with your normal  shampoo.  3.  After you shampoo, rinse your hair and body thoroughly to remove the shampoo.                             4.  Use CHG as you would any other liquid soap.  You can apply chg directly to the skin and wash.  Gently with a scrungie or clean washcloth.  5.  Apply the CHG Soap to your body ONLY FROM THE NECK DOWN.   Do   not use on face/ open  Wound or open sores. Avoid contact with eyes, ears mouth and   genitals (private parts).                       Wash face,  Genitals (private parts) with your normal soap.             6.  Wash thoroughly, paying special attention to the area where your    surgery  will be performed.  7.  Thoroughly rinse your body with warm water from the neck down.  8.  DO NOT shower/wash with your normal soap after using and rinsing off the CHG Soap.                9.  Pat yourself dry with a clean towel.            10.  Wear clean pajamas.            11.  Place clean sheets on your bed the night of your first shower and do not  sleep with pets. Day of Surgery  : Do not apply any lotions/deodorants the morning of surgery.  Please wear clean clothes to the hospital/surgery center.  FAILURE TO FOLLOW THESE INSTRUCTIONS MAY RESULT IN THE CANCELLATION OF YOUR SURGERY  PATIENT SIGNATURE_________________________________  NURSE SIGNATURE__________________________________  ________________________________________________________________________    Adam Phenix  An incentive spirometer is a tool that can help keep your lungs clear and active. This tool measures how well you are filling your lungs with each breath. Taking long deep breaths may help reverse or decrease the chance of developing breathing (pulmonary) problems (especially infection) following: A long period of time when you are unable to move or be active. BEFORE THE PROCEDURE  If the spirometer includes an indicator to show your best effort, your nurse or respiratory therapist will set it to a desired goal. If possible, sit up straight or lean slightly forward. Try not to slouch. Hold the incentive spirometer in an upright position. INSTRUCTIONS FOR USE  Sit on the edge of your bed if possible, or sit up as far as you can in bed or on a chair. Hold the incentive spirometer in an upright position. Breathe out normally. Place the mouthpiece in your mouth and seal your lips tightly around it. Breathe in slowly and as deeply as possible, raising the piston or the ball toward the top of the column. Hold your breath for 3-5 seconds or for as long as possible. Allow the piston or ball to fall to the bottom of the column. Remove the mouthpiece from your mouth and breathe out normally. Rest for a few seconds and repeat Steps 1 through 7 at least 10 times every 1-2 hours when you are awake. Take your time and take a few normal breaths between deep breaths. The spirometer may include an indicator to show your best effort. Use the indicator as a goal to work toward during each  repetition. After each set of 10 deep breaths, practice coughing to be sure your lungs are clear. If you have an incision (the cut made at the time of surgery), support your incision when coughing by placing a pillow or rolled up towels firmly against it. Once you are able to get out of bed, walk around indoors and cough well. You may stop using the incentive spirometer when instructed by your caregiver.  RISKS AND COMPLICATIONS Take your time so you do not get dizzy or light-headed. If you are in pain,  you may need to take or ask for pain medication before doing incentive spirometry. It is harder to take a deep breath if you are having pain. AFTER USE Rest and breathe slowly and easily. It can be helpful to keep track of a log of your progress. Your caregiver can provide you with a simple table to help with this. If you are using the spirometer at home, follow these instructions: [place] IF:  You are having difficultly using the spirometer. You have trouble using the spirometer as often as instructed. Your pain medication is not giving enough relief while using the spirometer. You develop fever of 100.5 F (38.1 C) or higher. SEEK IMMEDIATE MEDICAL CARE IF:  You cough up bloody sputum that had not been present before. You develop fever of 102 F (38.9 C) or greater. You develop worsening pain at or near the incision site. MAKE SURE YOU:  Understand these instructions. Will watch your condition. Will get help right away if you are not doing well or get worse. Document Released: 01/02/2007 Document Revised: 11/14/2011 Document Reviewed: 03/05/2007 [facility] Patient Information 2014 [place], [place].   ________________________________________________________________________

## 2022-11-17 ENCOUNTER — Ambulatory Visit (INDEPENDENT_AMBULATORY_CARE_PROVIDER_SITE_OTHER): Payer: Medicare Other | Admitting: Family Medicine

## 2022-11-17 ENCOUNTER — Other Ambulatory Visit: Payer: Self-pay | Admitting: Family Medicine

## 2022-11-17 ENCOUNTER — Encounter: Payer: Self-pay | Admitting: Family Medicine

## 2022-11-17 VITALS — BP 118/62 | HR 74 | Temp 98.0°F | Ht 67.0 in | Wt 162.4 lb

## 2022-11-17 DIAGNOSIS — Z0001 Encounter for general adult medical examination with abnormal findings: Secondary | ICD-10-CM

## 2022-11-17 DIAGNOSIS — Z13220 Encounter for screening for lipoid disorders: Secondary | ICD-10-CM

## 2022-11-17 DIAGNOSIS — E7800 Pure hypercholesterolemia, unspecified: Secondary | ICD-10-CM | POA: Diagnosis not present

## 2022-11-17 DIAGNOSIS — Z0000 Encounter for general adult medical examination without abnormal findings: Secondary | ICD-10-CM

## 2022-11-17 DIAGNOSIS — M810 Age-related osteoporosis without current pathological fracture: Secondary | ICD-10-CM | POA: Diagnosis not present

## 2022-11-17 DIAGNOSIS — Z1211 Encounter for screening for malignant neoplasm of colon: Secondary | ICD-10-CM

## 2022-11-17 DIAGNOSIS — Z1231 Encounter for screening mammogram for malignant neoplasm of breast: Secondary | ICD-10-CM

## 2022-11-17 NOTE — Progress Notes (Signed)
Subjective:    Patient ID: Tammy Mills, female    DOB: Jan 31, 1956, 67 y.o.   MRN: [id number]  HPI  Patient is a very pleasant 67 year old Caucasian female here today for complete physical exam.  She is due for her mammogram in April.  Her last colonoscopy was in 2014.  She is due this year.  She does not require another Pap smear due to her age.  Her last bone density test was in 2021.  This showed osteoporosis.  She is currently on Prolia every 6 months.  We will reschedule her for a new bone density test to monitor treatment that is due this year.  I reviewed her shot records.  She is due for Pneumovax 23 having already had Prevnar 20.  The shingles vaccine is up-to-date.  Her COVID and RSV today.  Her tetanus shot is due next year.  She is planning to have her knee replaced having had her other knee done.  She is scheduling that soon.  Otherwise she is doing well.  She denies any memory loss, depression, or falls Past Medical History:  Diagnosis Date   Mitral valve prolapse    mild to moderate regurgitation   Osteoporosis    Past Surgical History:  Procedure Laterality Date   BREAST EXCISIONAL BIOPSY Right    20 + yrs ago, benign   BREAST SURGERY     COLONOSCOPY     TOTAL KNEE ARTHROPLASTY Left 04/01/2022   Procedure: LEFT TOTAL KNEE ARTHROPLASTY;  Surgeon: Mcarthur Rossetti, MD;  Location: [facility];  Service: Orthopedics;  Laterality: Left;   WISDOM TOOTH EXTRACTION     Current Outpatient Medications on File Prior to Visit  Medication Sig Dispense Refill   Calcium Carbonate-Vitamin D (CALCIUM 600+D PO) Take 1 tablet by mouth 2 (two) times daily.     COVID-19 mRNA vaccine 2023-2024 (COMIRNATY) syringe Inject into the muscle. 0.3 mL 0   Cyanocobalamin (B-12) 5000 MCG CAPS Take 5,000 mcg by mouth daily.     docusate sodium (COLACE) 100 MG capsule Take 100 mg by mouth daily as needed for mild constipation.     famotidine-calcium carbonate-magnesium hydroxide (PEPCID COMPLETE)  10-800-165 MG chewable tablet Chew 1 tablet by mouth daily as needed (heartburn).     L-Lysine 1000 MG TABS Take 1,000 mg by mouth at bedtime.     loratadine (CLARITIN) 10 MG tablet Take 10 mg by mouth daily as needed for allergies.     Melatonin 10 MG TABS Take 10 mg by mouth at bedtime as needed (for sleep).     meloxicam (MOBIC) 7.5 MG tablet TAKE 1 TABLET BY MOUTH EVERY MORNING AND EVERY NIGHT AT BEDTIME 60 tablet 1   Multiple Vitamin (MULTIVITAMIN) tablet Take 1 tablet by mouth daily.     naproxen sodium (ALEVE) 220 MG tablet Take 660 mg by mouth daily as needed (knee pain).     Omega-3 Fatty Acids (FISH OIL PO) Take 900 mg by mouth daily.     No current facility-administered medications on file prior to visit.   Allergies  Allergen Reactions   Fosamax [Alendronate]     Mouth sores    Penicillins Other (See Comments)    "doesn't work well with me" per patient.   Social History   Socioeconomic History   Marital status: Single    Spouse name: Not on file   Number of children: Not on file   Years of education: Not on file   Highest education level: Not  on file  Occupational History   Not on file  Tobacco Use   Smoking status: Former    Types: Cigarettes    Quit date: 09/05/2005    Years since quitting: 17.2   Smokeless tobacco: Never  Vaping Use   Vaping Use: Never used  Substance and Sexual Activity   Alcohol use: Yes    Alcohol/week: 3.0 standard drinks of alcohol    Types: 1 Glasses of wine, 2 Cans of beer per week    Comment:  2 beers/ 1 glass of wine on weekends   Drug use: No   Sexual activity: Not on file  Other Topics Concern   Not on file  Social History Narrative   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   Lives with mom and 2 sisters   No children   Single   Enjoys walking/hiking/outdoors   Completed 2 years of college   Social Determinants of Radio broadcast assistant Strain: Not on file  Food Insecurity: Not on file  Transportation Needs:  Not on file  Physical Activity: Not on file  Stress: Not on file  Social Connections: Not on file  Intimate Partner Violence: Not on file   Family History  Problem Relation Age of Onset   Stroke Mother    Cancer Father        hogkins   Colon cancer Maternal Grandfather 58   Cancer Maternal Grandfather        colon   Hyperlipidemia Other    Cancer Paternal Grandmother        colon   Heart attack Neg Hx    Diabetes Neg Hx    Hypertension Neg Hx    Sudden death Neg Hx        Review of Systems  All other systems reviewed and are negative.      Objective:   Physical Exam Vitals reviewed.  Constitutional:      General: She is not in acute distress.    Appearance: Normal appearance. She is normal weight. She is not ill-appearing, toxic-appearing or diaphoretic.  HENT:     Right Ear: Tympanic membrane, ear canal and external ear normal. There is no impacted cerumen.     Left Ear: Tympanic membrane, ear canal and external ear normal. There is no impacted cerumen.     Nose: Nose normal. No congestion or rhinorrhea.     Mouth/Throat:     Mouth: Mucous membranes are moist.     Pharynx: Oropharynx is clear. No oropharyngeal exudate or posterior oropharyngeal erythema.  Eyes:     General:        Right eye: No discharge.        Left eye: No discharge.     Extraocular Movements: Extraocular movements intact.     Conjunctiva/sclera: Conjunctivae normal.     Pupils: Pupils are equal, round, and reactive to light.  Neck:     Vascular: No carotid bruit.  Cardiovascular:     Rate and Rhythm: Normal rate and regular rhythm.     Pulses: Normal pulses.     Heart sounds: Normal heart sounds. No murmur heard.    No friction rub. No gallop.  Pulmonary:     Effort: Pulmonary effort is normal. No respiratory distress.     Breath sounds: Normal breath sounds. No stridor. No wheezing, rhonchi or rales.  Chest:     Chest wall: No tenderness.  Abdominal:     General: Abdomen is flat.  Bowel sounds  are normal. There is no distension.     Palpations: Abdomen is soft.     Tenderness: There is no abdominal tenderness. There is no guarding.     Hernia: No hernia is present.  Musculoskeletal:     Cervical back: Normal range of motion and neck supple. No rigidity.     Right lower leg: No edema.     Left lower leg: No edema.  Lymphadenopathy:     Cervical: No cervical adenopathy.  Skin:    Coloration: Skin is not jaundiced or pale.     Findings: No bruising, erythema, lesion or rash.  Neurological:     General: No focal deficit present.     Mental Status: She is alert and oriented to person, place, and time. Mental status is at baseline.     Cranial Nerves: No cranial nerve deficit.     Sensory: No sensory deficit.     Motor: No weakness.     Coordination: Coordination normal.     Gait: Gait normal.     Deep Tendon Reflexes: Reflexes normal.  Psychiatric:        Mood and Affect: Mood normal.        Behavior: Behavior normal.        Thought Content: Thought content normal.        Judgment: Judgment normal.           Assessment & Plan:  Colon cancer screening - Plan: Ambulatory referral to Gastroenterology  Age-related osteoporosis without current pathological fracture - Plan: DG Bone Density  Screening for cholesterol level  Pure hypercholesterolemia - Plan: CBC with Differential/Platelet, Lipid panel, COMPLETE METABOLIC PANEL WITH GFR  General medical exam Physical exam today is completely normal.  Blood pressure is excellent.  I will check a CBC a CMP and a lipid panel.  Goal LDL cholesterol is less than 100.  Patient does have a history of a mildly elevated cholesterol but she is been taking fish oil to try to address this.  I recommended Pneumovax 23 at her earliest convenience.  I recommend that she schedule mammogram for April and a bone density test anytime this year.  I did put in a referral for colonoscopy as this is due.  The remainder of her  preventative care is up-to-date

## 2022-11-18 ENCOUNTER — Other Ambulatory Visit: Payer: Self-pay

## 2022-11-18 ENCOUNTER — Encounter (HOSPITAL_COMMUNITY): Payer: Self-pay

## 2022-11-18 ENCOUNTER — Encounter (HOSPITAL_COMMUNITY)
Admission: RE | Admit: 2022-11-18 | Discharge: 2022-11-18 | Disposition: A | Discharge status: Home / Self Care | Payer: Medicare Other | Source: Ambulatory Visit | Attending: Orthopaedic Surgery | Admitting: Orthopaedic Surgery

## 2022-11-18 VITALS — BP 149/93 | HR 69 | Temp 98.3°F | Resp 20 | Ht 67.0 in | Wt 160.2 lb

## 2022-11-18 DIAGNOSIS — Z01818 Encounter for other preprocedural examination: Secondary | ICD-10-CM

## 2022-11-18 DIAGNOSIS — Z01812 Encounter for preprocedural laboratory examination: Secondary | ICD-10-CM | POA: Insufficient documentation

## 2022-11-18 HISTORY — DX: Gastro-esophageal reflux disease without esophagitis: K21.9

## 2022-11-18 HISTORY — DX: Unspecified osteoarthritis, unspecified site: M19.90

## 2022-11-18 HISTORY — DX: Cardiac murmur, unspecified: R01.1

## 2022-11-18 LAB — COMPLETE METABOLIC PANEL WITH GFR
AG Ratio: 1.3 (calc) (ref 1.0–2.5)
ALT: 10 U/L (ref 6–29)
AST: 17 U/L (ref 10–35)
Albumin: 4.2 g/dL (ref 3.6–5.1)
Alkaline phosphatase (APISO): 33 U/L — ABNORMAL LOW (ref 37–153)
BUN: 20 mg/dL (ref 7–25)
CO2: 27 mmol/L (ref 20–32)
Calcium: 9.7 mg/dL (ref 8.6–10.4)
Chloride: 104 mmol/L (ref 98–110)
Creat: 0.89 mg/dL (ref 0.50–1.05)
Globulin: 3.2 g/dL (calc) (ref 1.9–3.7)
Glucose, Bld: 88 mg/dL (ref 65–99)
Potassium: 4.7 mmol/L (ref 3.5–5.3)
Sodium: 139 mmol/L (ref 135–146)
Total Bilirubin: 0.6 mg/dL (ref 0.2–1.2)
Total Protein: 7.4 g/dL (ref 6.1–8.1)
eGFR: 71 mL/min/{1.73_m2} (ref >=60)

## 2022-11-18 LAB — CBC WITH DIFFERENTIAL/PLATELET
Absolute Monocytes: 666 cells/uL (ref 200–950)
Basophils Absolute: 39 cells/uL (ref 0–200)
Basophils Relative: 0.8 %
Eosinophils Absolute: 113 cells/uL (ref 15–500)
Eosinophils Relative: 2.3 %
HCT: 38.2 % (ref 35.0–45.0)
Hemoglobin: 13.4 g/dL (ref 11.7–15.5)
Lymphs Abs: 1637 cells/uL (ref 850–3900)
MCH: 32.2 pg (ref 27.0–33.0)
MCHC: 35.1 g/dL (ref 32.0–36.0)
MCV: 91.8 fL (ref 80.0–100.0)
MPV: 10.1 fL (ref 7.5–12.5)
Monocytes Relative: 13.6 %
Neutro Abs: 2445 cells/uL (ref 1500–7800)
Neutrophils Relative %: 49.9 %
Platelets: 274 10*3/uL (ref 140–400)
RBC: 4.16 10*6/uL (ref 3.80–5.10)
RDW: 13.2 % (ref 11.0–15.0)
Total Lymphocyte: 33.4 %
WBC: 4.9 10*3/uL (ref 3.8–10.8)

## 2022-11-18 LAB — LIPID PANEL
Cholesterol: 201 mg/dL — ABNORMAL HIGH (ref <200)
HDL: 79 mg/dL (ref >=50)
LDL Cholesterol (Calc): 97 mg/dL (calc)
Non-HDL Cholesterol (Calc): 122 mg/dL (calc) (ref <130)
Total CHOL/HDL Ratio: 2.5 (calc) (ref <5.0)
Triglycerides: 149 mg/dL (ref <150)

## 2022-11-18 LAB — SURGICAL PCR SCREEN
MRSA, PCR: NEGATIVE
Staphylococcus aureus: NEGATIVE

## 2022-11-23 ENCOUNTER — Telehealth: Payer: Self-pay | Admitting: *Deleted

## 2022-11-23 DIAGNOSIS — G8929 Other chronic pain: Secondary | ICD-10-CM

## 2022-11-23 NOTE — Care Plan (Signed)
[facility] RNCM call to patient to discuss upcoming Right total knee arthroplasty with Dr. Ninfa Linden on Friday, 11/25/22 at [facility]. She is an Ortho bundle patient through [facility] and is agreeable to case management. Her sister will be assisting after discharge. She asked if we could schedule her directly with OPPT instead of HHPT. This is her 2nd knee replacement and feels OPPT will be more appropriate for her. She requested [facility] in [place] or [place]. She is now scheduled for OPPT in [place] on Monday, 11/28/22 at 8:00 am. She is aware.She has a RW and 3in1/BSC as well as a Knee immobilizer and ice machine. Reviewed post op care instructions and will continue to follow for needs.

## 2022-11-23 NOTE — Telephone Encounter (Signed)
Ortho bundle Pre-op call completed. 

## 2022-11-24 DIAGNOSIS — M1711 Unilateral primary osteoarthritis, right knee: Secondary | ICD-10-CM | POA: Insufficient documentation

## 2022-11-24 NOTE — Therapy (Signed)
OUTPATIENT PHYSICAL THERAPY LOWER EXTREMITY EVALUATION   Patient Name: Tammy Mills MRN: [id number] DOB:26-Feb-1956, 67 y.o., female Today's Date: 11/28/2022  END OF SESSION:  PT End of Session - 11/28/22 1533     Visit Number 1    Number of Visits 20    Date for PT Re-Evaluation 01/19/23    Authorization Type medicare and [employer]    Progress Note Due on Visit 10    PT Start Time [id number]    PT Stop Time 1530    PT Time Calculation (min) 45 min    Activity Tolerance Patient tolerated treatment well    Behavior During Therapy WFL for tasks assessed/performed             Past Medical History:  Diagnosis Date   Arthritis    GERD (gastroesophageal reflux disease)    Heart murmur    Mitral valve prolapse    mild to moderate regurgitation   Osteoporosis    Past Surgical History:  Procedure Laterality Date   BREAST EXCISIONAL BIOPSY Right    20 + yrs ago, benign   BREAST SURGERY     COLONOSCOPY     TOTAL KNEE ARTHROPLASTY Left 04/01/2022   Procedure: LEFT TOTAL KNEE ARTHROPLASTY;  Surgeon: Mcarthur Rossetti, MD;  Location: [facility];  Service: Orthopedics;  Laterality: Left;   WISDOM TOOTH EXTRACTION     Patient Active Problem List   Diagnosis Date Noted   Status post total right knee replacement 11/25/2022   Unilateral primary osteoarthritis, right knee 11/24/2022   Status post total left knee replacement 04/01/2022   Osteoporosis    Mitral valve prolapse    Osteopenia 05/07/2013   Routine general medical examination at a health care facility 04/30/2013   Right knee pain 05/08/2012   Right hip pain 05/08/2012   Knee pain, bilateral 06/03/2011   WEIGHT GAIN 06/28/2010    PCP: Dr Jenna Luo  REFERRING PROVIDER: Dr Jean Rosenthal  REFERRING DIAG: Rt TKA  THERAPY DIAG:  Chronic pain of right knee  Stiffness of right knee, not elsewhere classified  Muscle weakness (generalized)  Difficulty in walking, not elsewhere classified  Localized  edema  Rationale for Evaluation and Treatment: Rehabilitation  ONSET DATE: surgery 11/25/22  SUBJECTIVE:   SUBJECTIVE STATEMENT: Patient reports that she had TKA 11/25/22 after Rt knee pain over the course of many years with symptoms gradually increasing. She had Lt TKA [id number] with no complications.   PERTINENT HISTORY: Arthritis; heart murmur PAIN:  Are you having pain? Yes: NPRS scale: 3/10 Pain location: Rt knee  Pain description: aching Aggravating factors: moving knee; bending knee  Relieving factors: ice; meds  PRECAUTIONS: Post op Total Knee Arthroplasty   WEIGHT BEARING RESTRICTIONS: No  FALLS:  Has patient fallen in last 6 months? 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  No acute fracture. Valgus deformity  has progressed since prior films.  Osteochondroma remains  unchanged patella notch area.  Moderate patellofemoral arthritic changes.   COGNITION: Overall cognitive status: Within functional limits for tasks assessed     SENSATION: WFL  EDEMA:  Moderate edema Rt knee and thigh  MUSCLE LENGTH: Hamstrings: Right 65 deg; Left 70 deg Hip flexors: Right tight   POSTURE: rounded shoulders, forward head, and flexed trunk    LOWER EXTREMITY ROM: assessed in supine   Active ROM Right eval Left eval  Hip flexion    Hip extension    Hip abduction    Hip adduction     Hip internal rotation    Hip external rotation    Knee flexion 65 122  Knee extension -11 0  Ankle dorsiflexion    Ankle plantarflexion    Ankle inversion    Ankle eversion     (Blank rows = not tested)  LOWER EXTREMITY MMT:  MMT Right eval Left eval  Hip flexion NT 5  Hip extension NT 5  Hip abduction NT 5  Hip adduction NT 5  Hip internal rotation    Hip external rotation    Knee flexion NT 5  Knee extension NT 5  Ankle dorsiflexion    Ankle plantarflexion    Ankle inversion    Ankle eversion     (Blank rows = not tested)   FUNCTIONAL TESTS:    GAIT: Distance walked: 40 ft x 2 Assistive device utilized: Walker - 2 wheeled Level of assistance: Complete Independence Comments: fwd flexed trunk; limp Rt LE in wt bearing Rt with decreased stance time Rt; decreased hip/knee bend in swing phase of gait   [facility] Adult PT Treatment:                                                DATE: 11/28/22 Therapeutic Exercise: Supine  Quad set 5 sec x 10 SLR 5 sec s 10 (pt assisting with stretch out strap) Hip ab/adduction leg supported on pillow x 10  Sitting  Knee flexion feet unsupported dangling feet  Standing  Standing posture contraction of gluts and quads to work on trunk/hip/knee extension  Standing weight shift working on release of Rt knee with slight heel lift  Therapeutic Activity: Working on standing at home during [employer] of 30 min program 2x/day Gait: Encouraged release of Rt knee and equal stride length  Modalities: Ice machine at home  Self Care: Use of strap to assist with moving Rt LE for transfers as needed    PATIENT EDUCATION:  Education details: POC; HEP  Person educated: Patient Education method: Explanation, Demonstration, Tactile cues, Verbal cues, and Handouts Education comprehension: verbalized understanding, returned demonstration, verbal cues required, tactile cues required, and needs further education  HOME EXERCISE  PROGRAM: Access Code: [email or web address] URL: [email or web address] Date: 11/28/2022 Prepared by: Gillermo Murdoch  Exercises - Supine Quad Set  - 2 x daily - 7 x weekly - 1 sets - 10 reps - 3 sec  hold - Small Range Straight Leg Raise  - 2 x daily - 7 x weekly - 1 sets - 10 reps - 5 sec  hold - Supine Hip Abduction  - 2 x daily - 7 x weekly - 1 sets - 10 reps - 3 sec  hold - Supine Heel Slide with Strap  - 2 x daily - 7  x weekly - 1 sets - 5-10 reps - 10 sec  hold Standing with glut set and knee extension Standing with heel lift slight wt shift   ASSESSMENT:  CLINICAL IMPRESSION: Patient is a 67 y.o. female who was seen today for physical therapy evaluation and treatment s/p Rt TKA. She presents with abnormal gait; limited ROM, strength, function Rt LE; edema Rt thigh and knee; decreased transfers and bed mobility; limited functional activity level following Rt TKA. Patient will benefit from PT to address problems identified.   OBJECTIVE IMPAIRMENTS: Abnormal gait, decreased activity tolerance, decreased mobility, difficulty walking, decreased ROM, decreased strength, hypomobility, increased edema, increased fascial restrictions, impaired flexibility, improper body mechanics, postural dysfunction, and pain.   ACTIVITY LIMITATIONS: carrying, lifting, bending, sitting, standing, squatting, sleeping, stairs, transfers, bed mobility, bathing, toileting, and dressing  PARTICIPATION LIMITATIONS: meal prep, cleaning, laundry, driving, shopping, community activity, and yard work  PERSONAL FACTORS: Past/current experiences, Time since onset of injury/illness/exacerbation, and comorbidities including are also affecting patient's functional outcome.   REHAB POTENTIAL: Good  CLINICAL DECISION MAKING: Stable/uncomplicated  EVALUATION COMPLEXITY: Low   GOALS: Goals reviewed with patient? Yes  SHORT TERM GOALS: Target date: 12/22/2022  Patient independent in initial HEP  Baseline: Goal  status: INITIAL  2.  Patient independent in all transfers sit >/< stand </> supine  Baseline:  Goal status: INITIAL    LONG TERM GOALS: Target date: 01/19/2023   Increase AROM Rt knee to 0 degrees extension to 120 degrees flexion  Baseline:  Goal status: INITIAL  2.  4+/5 to 5/5 strength Rt LE Baseline:  Goal status: INITIAL  3.  Improved gait pattern with patient ambulatory for functional and community distances of 500-700 ft  Baseline:  Goal status: INITIAL  4.  Patient to report ability to walk dog for 1 mile with appropriate plan to work toward walking 3 miles   Baseline:  Goal status: INITIAL  5.  Independent in all transfers including floor </> chair  Baseline:  Goal status: INITIAL  6.  Independent in HEP including aquatic exercise program as indicated  Baseline:  Goal status: INITIAL   PLAN:  PT FREQUENCY: 2-3x/week  PT DURATION: 8 weeks  PLANNED INTERVENTIONS: Therapeutic exercises, Therapeutic activity, Neuromuscular re-education, Balance training, Gait training, Patient/Family education, Self Care, Joint mobilization, Aquatic Therapy, Dry Needling, Electrical stimulation, Cryotherapy, Moist heat, scar mobilization, Taping, Ultrasound, Ionotophoresis 4mg /ml Dexamethasone, Manual therapy, and Re-evaluation  PLAN FOR NEXT SESSION: review and progress exercise; gait training; manual work, DN, modalities as indicated   [employer], PT 11/28/2022, 3:36 PM

## 2022-11-24 NOTE — H&P (Signed)
TOTAL KNEE ADMISSION H&P  Patient is being admitted for right total knee arthroplasty.  Subjective:  Chief Complaint:right knee pain.  HPI: Tammy Mills, 67 y.o. female, has a history of pain and functional disability in the right knee due to arthritis and has failed non-surgical conservative treatments for greater than 12 weeks to includeNSAID's and/or analgesics, corticosteriod injections, viscosupplementation injections, flexibility and strengthening excercises, use of assistive devices, and activity modification.  Onset of symptoms was gradual, starting 5 years ago with gradually worsening course since that time. The patient noted no past surgery on the right knee(s).  Patient currently rates pain in the right knee(s) at 10 out of 10 with activity. Patient has night pain, worsening of pain with activity and weight bearing, pain that interferes with activities of daily living, pain with passive range of motion, crepitus, and joint swelling.  Patient has evidence of subchondral sclerosis, periarticular osteophytes, and joint space narrowing by imaging studies. There is no active infection.  Patient Active Problem List   Diagnosis Date Noted   Unilateral primary osteoarthritis, right knee 11/24/2022   Status post total left knee replacement 04/01/2022   Osteoporosis    Mitral valve prolapse    Osteopenia 05/07/2013   Routine general medical examination at a health care facility 04/30/2013   Right knee pain 05/08/2012   Right hip pain 05/08/2012   Knee pain, bilateral 06/03/2011   WEIGHT GAIN 06/28/2010   Past Medical History:  Diagnosis Date   Arthritis    GERD (gastroesophageal reflux disease)    Heart murmur    Mitral valve prolapse    mild to moderate regurgitation   Osteoporosis     Past Surgical History:  Procedure Laterality Date   BREAST EXCISIONAL BIOPSY Right    20 + yrs ago, benign   BREAST SURGERY     COLONOSCOPY     TOTAL KNEE ARTHROPLASTY Left 04/01/2022   Procedure:  LEFT TOTAL KNEE ARTHROPLASTY;  Surgeon: Mcarthur Rossetti, MD;  Location: [facility];  Service: Orthopedics;  Laterality: Left;   WISDOM TOOTH EXTRACTION      No current facility-administered medications for this encounter.   Current Outpatient Medications  Medication Sig Dispense Refill Last Dose   Calcium Carbonate-Vitamin D (CALCIUM 600+D PO) Take 1 tablet by mouth 2 (two) times daily.      Cyanocobalamin (B-12) 5000 MCG CAPS Take 5,000 mcg by mouth daily.      docusate sodium (COLACE) 100 MG capsule Take 100 mg by mouth daily as needed for mild constipation.      famotidine-calcium carbonate-magnesium hydroxide (PEPCID COMPLETE) 10-800-165 MG chewable tablet Chew 1 tablet by mouth daily as needed (heartburn).      L-Lysine 1000 MG TABS Take 1,000 mg by mouth at bedtime.      loratadine (CLARITIN) 10 MG tablet Take 10 mg by mouth daily as needed for allergies.      Melatonin 10 MG TABS Take 10 mg by mouth at bedtime as needed (for sleep).      meloxicam (MOBIC) 7.5 MG tablet TAKE 1 TABLET BY MOUTH EVERY MORNING AND EVERY NIGHT AT BEDTIME 60 tablet 1    Multiple Vitamin (MULTIVITAMIN) tablet Take 1 tablet by mouth daily.      naproxen sodium (ALEVE) 220 MG tablet Take 660 mg by mouth daily as needed (knee pain).      Omega-3 Fatty Acids (FISH OIL PO) Take 900 mg by mouth daily.      COVID-19 mRNA vaccine 2023-2024 (COMIRNATY) syringe Inject  into the muscle. 0.3 mL 0    Allergies  Allergen Reactions   Fosamax [Alendronate]     Mouth sores    Penicillins Other (See Comments)    "doesn't work well with me" per patient.    Social History   Tobacco Use   Smoking status: Former    Packs/day: 0.50    Years: 32.00    Additional pack years: 0.00    Total pack years: 16.00    Types: Cigarettes    Quit date: 09/05/2005    Years since quitting: 17.2   Smokeless tobacco: Never  Substance Use Topics   Alcohol use: Yes    Alcohol/week: 3.0 standard drinks of alcohol    Types: 1 Glasses  of wine, 2 Cans of beer per week    Comment:  2 beers/ 1 glass of wine on weekends    Family History  Problem Relation Age of Onset   Stroke Mother    Cancer Father        hogkins   Colon cancer Maternal Grandfather 3   Cancer Maternal Grandfather        colon   Hyperlipidemia Other    Cancer Paternal Grandmother        colon   Heart attack Neg Hx    Diabetes Neg Hx    Hypertension Neg Hx    Sudden death Neg Hx      Review of Systems  Musculoskeletal:  Positive for gait problem and joint swelling.  All other systems reviewed and are negative.   Objective:  Physical Exam Vitals reviewed.  Constitutional:      Appearance: Normal appearance. She is normal weight.  HENT:     Head: Normocephalic and atraumatic.  Eyes:     Extraocular Movements: Extraocular movements intact.     Pupils: Pupils are equal, round, and reactive to light.  Cardiovascular:     Rate and Rhythm: Normal rate and regular rhythm.  Pulmonary:     Effort: Pulmonary effort is normal.     Breath sounds: Normal breath sounds.  Abdominal:     Palpations: Abdomen is soft.  Musculoskeletal:     Cervical back: Normal range of motion and neck supple.     Right knee: Effusion, bony tenderness and crepitus present. Decreased range of motion. Tenderness present over the lateral joint line. Abnormal alignment.  Neurological:     Mental Status: She is alert and oriented to person, place, and time.  Psychiatric:        Behavior: Behavior normal.     Vital signs in last 24 hours:    Labs:   Estimated body mass index is 25.09 kg/m as calculated from the following:   Height as of 11/18/22: 5\' 7"  (1.702 m).   Weight as of 11/18/22: 72.7 kg.   Imaging Review Plain radiographs demonstrate severe degenerative joint disease of the right knee(s). The overall alignment is significant valgus The bone quality appears to be good for age and reported activity level.      Assessment/Plan:  End stage  arthritis, right knee   The patient history, physical examination, clinical judgment of the provider and imaging studies are consistent with end stage degenerative joint disease of the right knee(s) and total knee arthroplasty is deemed medically necessary. The treatment options including medical management, injection therapy arthroscopy and arthroplasty were discussed at length. The risks and benefits of total knee arthroplasty were presented and reviewed. The risks due to aseptic loosening, infection, stiffness, patella tracking problems,  thromboembolic complications and other imponderables were discussed. The patient acknowledged the explanation, agreed to proceed with the plan and consent was signed. Patient is being admitted for inpatient treatment for surgery, pain control, PT, OT, prophylactic antibiotics, VTE prophylaxis, progressive ambulation and ADL's and discharge planning. The patient is planning to be discharged home with home health services

## 2022-11-25 ENCOUNTER — Ambulatory Visit (HOSPITAL_BASED_OUTPATIENT_CLINIC_OR_DEPARTMENT_OTHER): Payer: Medicare Other | Admitting: Certified Registered"

## 2022-11-25 ENCOUNTER — Other Ambulatory Visit: Payer: Self-pay

## 2022-11-25 ENCOUNTER — Encounter (HOSPITAL_COMMUNITY): Payer: Self-pay | Admitting: Orthopaedic Surgery

## 2022-11-25 ENCOUNTER — Ambulatory Visit (HOSPITAL_COMMUNITY): Payer: Medicare Other | Admitting: Physician Assistant

## 2022-11-25 ENCOUNTER — Encounter (HOSPITAL_COMMUNITY)
Admission: RE | Disposition: A | Discharge status: Home / Self Care | Payer: Self-pay | Source: Ambulatory Visit | Attending: Orthopaedic Surgery

## 2022-11-25 ENCOUNTER — Observation Stay (HOSPITAL_COMMUNITY): Payer: Medicare Other

## 2022-11-25 ENCOUNTER — Observation Stay (HOSPITAL_COMMUNITY)
Admission: RE | Admit: 2022-11-25 | Discharge: 2022-11-27 | Disposition: A | Discharge status: Home / Self Care | Payer: Medicare Other | Source: Ambulatory Visit | Attending: Orthopaedic Surgery | Admitting: Orthopaedic Surgery

## 2022-11-25 DIAGNOSIS — M1711 Unilateral primary osteoarthritis, right knee: Secondary | ICD-10-CM

## 2022-11-25 DIAGNOSIS — Z96652 Presence of left artificial knee joint: Secondary | ICD-10-CM | POA: Insufficient documentation

## 2022-11-25 DIAGNOSIS — Z87891 Personal history of nicotine dependence: Secondary | ICD-10-CM | POA: Insufficient documentation

## 2022-11-25 DIAGNOSIS — M21061 Valgus deformity, not elsewhere classified, right knee: Secondary | ICD-10-CM | POA: Insufficient documentation

## 2022-11-25 DIAGNOSIS — Z96651 Presence of right artificial knee joint: Secondary | ICD-10-CM

## 2022-11-25 HISTORY — PX: TOTAL KNEE ARTHROPLASTY: SHX125

## 2022-11-25 LAB — TYPE AND SCREEN
ABO/RH(D): AB POS
Antibody Screen: NEGATIVE

## 2022-11-25 SURGERY — ARTHROPLASTY, KNEE, TOTAL
Anesthesia: Monitor Anesthesia Care | Site: Knee | Laterality: Right

## 2022-11-25 MED ORDER — PROPOFOL 500 MG/50ML IV EMUL
INTRAVENOUS | Status: DC | PRN
  Administered 2022-11-25: 100 ug/kg/min via INTRAVENOUS

## 2022-11-25 MED ORDER — CHLORHEXIDINE GLUCONATE 0.12 % MT SOLN
15.0000 mL | Freq: Once | OROMUCOSAL | Status: AC
  Administered 2022-11-25: 15 mL via OROMUCOSAL

## 2022-11-25 MED ORDER — PROPOFOL 500 MG/50ML IV EMUL
INTRAVENOUS | Status: AC
  Filled 2022-11-25: qty 50

## 2022-11-25 MED ORDER — DEXAMETHASONE SODIUM PHOSPHATE 10 MG/ML IJ SOLN
INTRAMUSCULAR | Status: AC
  Filled 2022-11-25: qty 1

## 2022-11-25 MED ORDER — CEFAZOLIN SODIUM-DEXTROSE 2-4 GM/100ML-% IV SOLN
2.0000 g | INTRAVENOUS | Status: AC
  Administered 2022-11-25: 2 g via INTRAVENOUS
  Filled 2022-11-25: qty 100

## 2022-11-25 MED ORDER — PANTOPRAZOLE SODIUM 40 MG PO TBEC
40.0000 mg | DELAYED_RELEASE_TABLET | Freq: Every day | ORAL | Status: DC
  Administered 2022-11-25 – 2022-11-27 (×3): 40 mg via ORAL
  Filled 2022-11-25 (×3): qty 1

## 2022-11-25 MED ORDER — METHOCARBAMOL 500 MG IVPB - SIMPLE MED
500.0000 mg | Freq: Four times a day (QID) | INTRAVENOUS | Status: DC | PRN
  Administered 2022-11-25: 500 mg via INTRAVENOUS

## 2022-11-25 MED ORDER — PROPOFOL 1000 MG/100ML IV EMUL
INTRAVENOUS | Status: AC
  Filled 2022-11-25: qty 100

## 2022-11-25 MED ORDER — ACETAMINOPHEN 325 MG PO TABS
325.0000 mg | ORAL_TABLET | Freq: Four times a day (QID) | ORAL | Status: DC | PRN
  Filled 2022-11-25: qty 2

## 2022-11-25 MED ORDER — TRANEXAMIC ACID-NACL 1000-0.7 MG/100ML-% IV SOLN
1000.0000 mg | INTRAVENOUS | Status: AC
  Administered 2022-11-25: 1000 mg via INTRAVENOUS
  Filled 2022-11-25: qty 100

## 2022-11-25 MED ORDER — ROPIVACAINE HCL 5 MG/ML IJ SOLN
INTRAMUSCULAR | Status: DC | PRN
  Administered 2022-11-25: 25 mL via PERINEURAL

## 2022-11-25 MED ORDER — METHOCARBAMOL 500 MG IVPB - SIMPLE MED
INTRAVENOUS | Status: AC
  Filled 2022-11-25: qty 55

## 2022-11-25 MED ORDER — HYDROMORPHONE HCL 1 MG/ML IJ SOLN: 0.5000 mg | INTRAMUSCULAR | Status: DC | PRN

## 2022-11-25 MED ORDER — ONDANSETRON HCL 4 MG/2ML IJ SOLN: 4.0000 mg | Freq: Four times a day (QID) | INTRAMUSCULAR | Status: DC | PRN

## 2022-11-25 MED ORDER — BUPIVACAINE-EPINEPHRINE (PF) 0.25% -1:200000 IJ SOLN
INTRAMUSCULAR | Status: AC
  Filled 2022-11-25: qty 30

## 2022-11-25 MED ORDER — ONDANSETRON HCL 4 MG/2ML IJ SOLN
INTRAMUSCULAR | Status: AC
  Filled 2022-11-25: qty 2

## 2022-11-25 MED ORDER — PROPOFOL 10 MG/ML IV BOLUS
INTRAVENOUS | Status: AC
  Filled 2022-11-25: qty 20

## 2022-11-25 MED ORDER — MELATONIN 5 MG PO TABS
10.0000 mg | ORAL_TABLET | Freq: Every evening | ORAL | Status: DC | PRN
  Administered 2022-11-25: 10 mg via ORAL
  Filled 2022-11-25: qty 2

## 2022-11-25 MED ORDER — FENTANYL CITRATE PF 50 MCG/ML IJ SOSY: 25.0000 ug | PREFILLED_SYRINGE | INTRAMUSCULAR | Status: DC | PRN

## 2022-11-25 MED ORDER — OXYCODONE HCL 5 MG PO TABS
ORAL_TABLET | ORAL | Status: AC
  Filled 2022-11-25: qty 1

## 2022-11-25 MED ORDER — MIDAZOLAM HCL 2 MG/2ML IJ SOLN
1.0000 mg | INTRAMUSCULAR | Status: AC
  Administered 2022-11-25: 1 mg via INTRAVENOUS
  Filled 2022-11-25: qty 2

## 2022-11-25 MED ORDER — EPHEDRINE 5 MG/ML INJ
INTRAVENOUS | Status: AC
  Filled 2022-11-25: qty 5

## 2022-11-25 MED ORDER — SODIUM CHLORIDE 0.9 % IR SOLN
Status: DC | PRN
  Administered 2022-11-25: 1000 mL

## 2022-11-25 MED ORDER — 0.9 % SODIUM CHLORIDE (POUR BTL) OPTIME
TOPICAL | Status: DC | PRN
  Administered 2022-11-25: 1000 mL

## 2022-11-25 MED ORDER — DIPHENHYDRAMINE HCL 12.5 MG/5ML PO ELIX: 12.5000 mg | ORAL_SOLUTION | ORAL | Status: DC | PRN

## 2022-11-25 MED ORDER — EPHEDRINE SULFATE-NACL 50-0.9 MG/10ML-% IV SOSY
PREFILLED_SYRINGE | INTRAVENOUS | Status: DC | PRN
  Administered 2022-11-25: 10 mg via INTRAVENOUS

## 2022-11-25 MED ORDER — OXYCODONE HCL 5 MG/5ML PO SOLN: 5.0000 mg | Freq: Once | ORAL | Status: AC | PRN

## 2022-11-25 MED ORDER — METOCLOPRAMIDE HCL 5 MG PO TABS: 5.0000 mg | ORAL_TABLET | Freq: Three times a day (TID) | ORAL | Status: DC | PRN

## 2022-11-25 MED ORDER — ASPIRIN 81 MG PO CHEW
81.0000 mg | CHEWABLE_TABLET | Freq: Two times a day (BID) | ORAL | Status: DC
  Administered 2022-11-25 – 2022-11-27 (×4): 81 mg via ORAL
  Filled 2022-11-25 (×4): qty 1

## 2022-11-25 MED ORDER — ONDANSETRON HCL 4 MG PO TABS: 4.0000 mg | ORAL_TABLET | Freq: Four times a day (QID) | ORAL | Status: DC | PRN

## 2022-11-25 MED ORDER — VITAMIN B-12 1000 MCG PO TABS
5000.0000 ug | ORAL_TABLET | Freq: Every day | ORAL | Status: DC
  Administered 2022-11-25 – 2022-11-27 (×3): 5000 ug via ORAL
  Filled 2022-11-25 (×3): qty 5

## 2022-11-25 MED ORDER — OXYCODONE HCL 5 MG PO TABS
5.0000 mg | ORAL_TABLET | Freq: Once | ORAL | Status: AC | PRN
  Administered 2022-11-25: 5 mg via ORAL

## 2022-11-25 MED ORDER — MENTHOL 3 MG MT LOZG: 1.0000 | LOZENGE | OROMUCOSAL | Status: DC | PRN

## 2022-11-25 MED ORDER — BUPIVACAINE IN DEXTROSE 0.75-8.25 % IT SOLN
INTRATHECAL | Status: DC | PRN
  Administered 2022-11-25: 1.6 mL via INTRATHECAL

## 2022-11-25 MED ORDER — LACTATED RINGERS IV SOLN: INTRAVENOUS | Status: DC

## 2022-11-25 MED ORDER — BUPIVACAINE-EPINEPHRINE 0.25% -1:200000 IJ SOLN
INTRAMUSCULAR | Status: DC | PRN
  Administered 2022-11-25: 30 mL

## 2022-11-25 MED ORDER — ONDANSETRON HCL 4 MG/2ML IJ SOLN
INTRAMUSCULAR | Status: DC | PRN
  Administered 2022-11-25: 4 mg via INTRAVENOUS

## 2022-11-25 MED ORDER — PHENOL 1.4 % MT LIQD: 1.0000 | OROMUCOSAL | Status: DC | PRN

## 2022-11-25 MED ORDER — L-LYSINE 1000 MG PO TABS: 1000.0000 mg | ORAL_TABLET | Freq: Every day | ORAL | Status: DC

## 2022-11-25 MED ORDER — PHENYLEPHRINE HCL-NACL 20-0.9 MG/250ML-% IV SOLN
INTRAVENOUS | Status: DC | PRN
  Administered 2022-11-25: 25 ug/min via INTRAVENOUS

## 2022-11-25 MED ORDER — ALUM & MAG HYDROXIDE-SIMETH 200-200-20 MG/5ML PO SUSP
30.0000 mL | ORAL | Status: DC | PRN
  Administered 2022-11-26: 30 mL via ORAL
  Filled 2022-11-25: qty 30

## 2022-11-25 MED ORDER — ORAL CARE MOUTH RINSE: 15.0000 mL | Freq: Once | OROMUCOSAL | Status: AC

## 2022-11-25 MED ORDER — HYDROMORPHONE HCL 2 MG PO TABS
2.0000 mg | ORAL_TABLET | ORAL | Status: DC | PRN
  Administered 2022-11-25 – 2022-11-26 (×2): 2 mg via ORAL
  Filled 2022-11-25 (×2): qty 1

## 2022-11-25 MED ORDER — POVIDONE-IODINE 10 % EX SWAB: 2.0000 | Freq: Once | CUTANEOUS | Status: DC

## 2022-11-25 MED ORDER — DOCUSATE SODIUM 100 MG PO CAPS
100.0000 mg | ORAL_CAPSULE | Freq: Two times a day (BID) | ORAL | Status: DC
  Administered 2022-11-25 – 2022-11-27 (×3): 100 mg via ORAL
  Filled 2022-11-25 (×4): qty 1

## 2022-11-25 MED ORDER — SODIUM CHLORIDE 0.9 % IV SOLN: INTRAVENOUS | Status: DC

## 2022-11-25 MED ORDER — METHOCARBAMOL 500 MG PO TABS
500.0000 mg | ORAL_TABLET | Freq: Four times a day (QID) | ORAL | Status: DC | PRN
  Administered 2022-11-25 – 2022-11-27 (×5): 500 mg via ORAL
  Filled 2022-11-25 (×5): qty 1

## 2022-11-25 MED ORDER — METOCLOPRAMIDE HCL 5 MG/ML IJ SOLN: 5.0000 mg | Freq: Three times a day (TID) | INTRAMUSCULAR | Status: DC | PRN

## 2022-11-25 MED ORDER — OXYCODONE HCL 5 MG PO TABS
5.0000 mg | ORAL_TABLET | ORAL | Status: DC | PRN
  Administered 2022-11-25 – 2022-11-26 (×3): 10 mg via ORAL
  Filled 2022-11-25 (×3): qty 2

## 2022-11-25 MED ORDER — DEXAMETHASONE SODIUM PHOSPHATE 10 MG/ML IJ SOLN
INTRAMUSCULAR | Status: DC | PRN
  Administered 2022-11-25: 8 mg via INTRAVENOUS

## 2022-11-25 MED ORDER — CEFAZOLIN SODIUM-DEXTROSE 1-4 GM/50ML-% IV SOLN
1.0000 g | Freq: Four times a day (QID) | INTRAVENOUS | Status: AC
  Administered 2022-11-25 (×2): 1 g via INTRAVENOUS
  Filled 2022-11-25 (×2): qty 50

## 2022-11-25 MED ORDER — FENTANYL CITRATE PF 50 MCG/ML IJ SOSY
50.0000 ug | PREFILLED_SYRINGE | INTRAMUSCULAR | Status: AC
  Administered 2022-11-25: 50 ug via INTRAVENOUS
  Filled 2022-11-25: qty 2

## 2022-11-25 SURGICAL SUPPLY — 69 items
APL SKNCLS STERI-STRIP NONHPOA (GAUZE/BANDAGES/DRESSINGS)
BAG COUNTER SPONGE SURGICOUNT (BAG) IMPLANT
BAG SPEC THK2 15X12 ZIP CLS (MISCELLANEOUS) ×1
BAG SPNG CNTER NS LX DISP (BAG)
BAG ZIPLOCK 12X15 (MISCELLANEOUS) ×1 IMPLANT
BENZOIN TINCTURE PRP APPL 2/3 (GAUZE/BANDAGES/DRESSINGS) IMPLANT
BLADE SAG 18X100X1.27 (BLADE) ×1 IMPLANT
BLADE SAW SGTL 11.0X1.19X90.0M (BLADE) IMPLANT
BLADE SURG SZ10 CARB STEEL (BLADE) ×2 IMPLANT
BNDG CMPR 5X62 HK CLSR LF (GAUZE/BANDAGES/DRESSINGS) ×2
BNDG CMPR MED 10X6 ELC LF (GAUZE/BANDAGES/DRESSINGS) ×1
BNDG ELASTIC 6INX 5YD STR LF (GAUZE/BANDAGES/DRESSINGS) ×2 IMPLANT
BNDG ELASTIC 6X10 VLCR STRL LF (GAUZE/BANDAGES/DRESSINGS) IMPLANT
BOWL SMART MIX CTS (DISPOSABLE) IMPLANT
BSPLAT TIB 5D E CMNT KN RT (Knees) ×1 IMPLANT
CEMENT BONE R 1X40 (Cement) IMPLANT
COOLER ICEMAN CLASSIC (MISCELLANEOUS) ×1 IMPLANT
COVER SURGICAL LIGHT HANDLE (MISCELLANEOUS) ×1 IMPLANT
CUFF TOURN SGL QUICK 34 (TOURNIQUET CUFF) ×1
CUFF TRNQT CYL 34X4.125X (TOURNIQUET CUFF) ×1 IMPLANT
DRAPE INCISE IOBAN 66X45 STRL (DRAPES) ×1 IMPLANT
DRAPE U-SHAPE 47X51 STRL (DRAPES) ×1 IMPLANT
DURAPREP 26ML APPLICATOR (WOUND CARE) ×1 IMPLANT
ELECT BLADE TIP CTD 4 INCH (ELECTRODE) ×1 IMPLANT
ELECT REM PT RETURN 15FT ADLT (MISCELLANEOUS) ×1 IMPLANT
FEMUR  CMT CCR STD SZ9 R KNEE (Knees) ×1 IMPLANT
FEMUR CMT CCR STD SZ9 R KNEE (Knees) ×1 IMPLANT
FEMUR CMTD CCR STD SZ9 R KNEE (Knees) IMPLANT
GAUZE PAD ABD 7.5X8 STRL (GAUZE/BANDAGES/DRESSINGS) IMPLANT
GAUZE PAD ABD 8X10 STRL (GAUZE/BANDAGES/DRESSINGS) ×2 IMPLANT
GAUZE SPONGE 4X4 12PLY STRL (GAUZE/BANDAGES/DRESSINGS) ×1 IMPLANT
GAUZE XEROFORM 1X8 LF (GAUZE/BANDAGES/DRESSINGS) IMPLANT
GLOVE BIO SURGEON STRL SZ7.5 (GLOVE) ×1 IMPLANT
GLOVE BIOGEL PI IND STRL 8 (GLOVE) ×2 IMPLANT
GLOVE ECLIPSE 8.0 STRL XLNG CF (GLOVE) ×1 IMPLANT
GOWN STRL REUS W/ TWL XL LVL3 (GOWN DISPOSABLE) ×2 IMPLANT
GOWN STRL REUS W/TWL XL LVL3 (GOWN DISPOSABLE) ×2
HANDPIECE INTERPULSE COAX TIP (DISPOSABLE) ×1
HDLS TROCR DRIL PIN KNEE 75 (PIN) ×1
HOLDER FOLEY CATH W/STRAP (MISCELLANEOUS) IMPLANT
IMMOBILIZER KNEE 20 (SOFTGOODS) ×1
IMMOBILIZER KNEE 20 THIGH 36 (SOFTGOODS) ×1 IMPLANT
INSERT PSN CPS VE RIGHT 12M (Insert) IMPLANT
KIT TURNOVER KIT A (KITS) IMPLANT
NS IRRIG 1000ML POUR BTL (IV SOLUTION) ×1 IMPLANT
PACK TOTAL KNEE CUSTOM (KITS) ×1 IMPLANT
PAD COLD SHLDR WRAP-ON (PAD) ×1 IMPLANT
PADDING CAST ABS COTTON 6X4 NS (CAST SUPPLIES) IMPLANT
PADDING CAST COTTON 6X4 STRL (CAST SUPPLIES) ×2 IMPLANT
PIN DRILL HDLS TROCAR 75 4PK (PIN) IMPLANT
PROTECTOR NERVE ULNAR (MISCELLANEOUS) ×1 IMPLANT
SCREW FEMALE HEX FIX 25X2.5 (ORTHOPEDIC DISPOSABLE SUPPLIES) IMPLANT
SET HNDPC FAN SPRY TIP SCT (DISPOSABLE) ×1 IMPLANT
SET PAD KNEE POSITIONER (MISCELLANEOUS) ×1 IMPLANT
SPIKE FLUID TRANSFER (MISCELLANEOUS) IMPLANT
STAPLER VISISTAT 35W (STAPLE) IMPLANT
STEM POLY PAT PLY 32M KNEE (Knees) IMPLANT
STEM TIB ST PERS 14+30 (Stem) IMPLANT
STEM TIBIA 5 DEG SZ E R KNEE (Knees) IMPLANT
STRIP CLOSURE SKIN 1/2X4 (GAUZE/BANDAGES/DRESSINGS) IMPLANT
SUT MNCRL AB 4-0 PS2 18 (SUTURE) IMPLANT
SUT VIC AB 0 CT1 27 (SUTURE) ×1
SUT VIC AB 0 CT1 27XBRD ANTBC (SUTURE) ×1 IMPLANT
SUT VIC AB 1 CT1 36 (SUTURE) ×2 IMPLANT
SUT VIC AB 2-0 CT1 27 (SUTURE) ×2
SUT VIC AB 2-0 CT1 TAPERPNT 27 (SUTURE) ×2 IMPLANT
TIBIA STEM 5 DEG SZ E R KNEE (Knees) ×1 IMPLANT
TRAY FOLEY MTR SLVR 16FR STAT (SET/KITS/TRAYS/PACK) IMPLANT
WATER STERILE IRR 1000ML POUR (IV SOLUTION) ×2 IMPLANT

## 2022-11-25 NOTE — Anesthesia Preprocedure Evaluation (Signed)
Anesthesia Evaluation  Patient identified by MRN, date of birth, ID band Patient awake    Reviewed: Allergy & Precautions, H&P , NPO status , Patient's Chart, lab work & pertinent test results  Airway Mallampati: II   Neck ROM: full    Dental   Pulmonary former smoker   breath sounds clear to auscultation       Cardiovascular + Valvular Problems/Murmurs MVP  Rhythm:regular Rate:Normal     Neuro/Psych    GI/Hepatic ,GERD  ,,  Endo/Other    Renal/GU      Musculoskeletal  (+) Arthritis ,    Abdominal   Peds  Hematology   Anesthesia Other Findings   Reproductive/Obstetrics                             Anesthesia Physical Anesthesia Plan  ASA: 2  Anesthesia Plan: Spinal and MAC   Post-op Pain Management: Regional block*   Induction: Intravenous  PONV Risk Score and Plan: 2 and Ondansetron, Propofol infusion, Midazolam and Treatment may vary due to age or medical condition  Airway Management Planned: Nasal Cannula  Additional Equipment:   Intra-op Plan:   Post-operative Plan:   Informed Consent: I have reviewed the patients History and Physical, chart, labs and discussed the procedure including the risks, benefits and alternatives for the proposed anesthesia with the patient or authorized representative who has indicated his/her understanding and acceptance.     Dental advisory given  Plan Discussed with: CRNA, Anesthesiologist and Surgeon  Anesthesia Plan Comments:        Anesthesia Quick Evaluation

## 2022-11-25 NOTE — Evaluation (Signed)
Physical Therapy Evaluation Patient Details Name: Tammy Mills MRN: [id number] DOB: 1956/04/10 Today's Date: 11/25/2022  History of Present Illness  Pt is a 67yo female presenting s/p R TKA on 11/25/2022 due to failure to thrive. Pt PMH includes but is not limited to: OP, Mitral valve prolapse and L TKA (04/01/2022).  Clinical Impression    Tammy Mills is a 67 y.o. female POD 0 s/p R TKA. Patient reports IND with mobility at baseline. Patient is now limited by functional impairments (see PT problem list below) and requires min guard for bed mobility and min guard and cues for transfers. Patient was able to ambulate 6 feet with RW and min guard level of assist. Gait limited due to pt reports of feeling dizzy and light headed, nursing made aware. Patient instructed in exercise to facilitate ROM and circulation to manage edema. Patient will benefit from continued skilled PT interventions to address impairments and progress towards PLOF. Acute PT will follow to progress mobility and stair training in preparation for safe discharge home.      Recommendations for follow up therapy are one component of a multi-disciplinary discharge planning process, led by the attending physician.  Recommendations may be updated based on patient status, additional functional criteria and insurance authorization.  Follow Up Recommendations Home health PT      Assistance Recommended at Discharge Intermittent Supervision/Assistance  Patient can return home with the following  A little help with walking and/or transfers;A little help with bathing/dressing/bathroom;Assistance with cooking/housework;Assist for transportation;Help with stairs or ramp for entrance    Equipment Recommendations None recommended by PT (pt has DME)  Recommendations for Other Services       Functional Status Assessment Patient has had a recent decline in their functional status and demonstrates the ability to make significant improvements in  function in a reasonable and predictable amount of time.     Precautions / Restrictions Precautions Precautions: Knee;Fall Restrictions Weight Bearing Restrictions: No RLE Weight Bearing: Weight bearing as tolerated      Mobility  Bed Mobility Overal bed mobility: Needs Assistance Bed Mobility: Supine to Sit     Supine to sit: Min guard     General bed mobility comments: HOB elevated and use of L bed rail with increased time    Transfers Overall transfer level: Needs assistance Equipment used: Rolling walker (2 wheels) Transfers: Sit to/from Stand Sit to Stand: Min guard           General transfer comment: cues for proper UE and AD placement    Ambulation/Gait Ambulation/Gait assistance: Min guard Gait Distance (Feet): 6 Feet Assistive device: Rolling walker (2 wheels) Gait Pattern/deviations: Step-to pattern, Antalgic Gait velocity: decreased     General Gait Details: gait limited due pt c/o dizziness, light headedness and pt stated she had not eaten anything after sx and compounded by elevated BP in PACU  Stairs            Wheelchair Mobility    Modified Rankin (Stroke Patients Only)       Balance Overall balance assessment: Needs assistance Sitting-balance support: Feet supported Sitting balance-[name]: Fair     Standing balance support: During functional activity, Reliant on assistive device for balance Standing balance-[name]: Poor                               Pertinent Vitals/Pain Pain Assessment Pain Assessment: 0-10 Pain Score: 4  Pain Location: R knee Pain  Descriptors / Indicators: Constant, Aching, Operative site guarding, Discomfort Pain Intervention(s): Limited activity within patient's tolerance, Monitored during session, Premedicated before session, Ice applied    Home Living Family/patient expects to be discharged to:: Private residence Living Arrangements: Other relatives Available Help at  Discharge: Family;Available 24 hours/day Type of Home: House Home Access: Stairs to enter Entrance Stairs-Rails: None Entrance Stairs-Number of Steps: 4 Alternate Level Stairs-Number of Steps: 14 Home Layout: Multi-level Home Equipment: Shower seat - built in;Grab bars - Statistician (2 wheels);BSC/3in1      Prior Function Prior Level of Function : Independent/Modified Independent;Driving             Mobility Comments: IND with all ADLs, self care tasks, IADLs, driving       Hand Dominance        Extremity/Trunk Assessment        Lower Extremity Assessment Lower Extremity Assessment: RLE deficits/detail RLE Deficits / Details: ankle DF/PF 5/5, SLR < 10 degree lag RLE Sensation: WNL       Communication   Communication: No difficulties  Cognition Arousal/Alertness: Awake/alert Behavior During Therapy: WFL for tasks assessed/performed Overall Cognitive Status: Within Functional Limits for tasks assessed                                          General Comments      Exercises Total Joint Exercises Ankle Circles/Pumps: AROM, Both, 20 reps   Assessment/Plan    PT Assessment Patient needs continued PT services  PT Problem List Decreased strength;Decreased range of motion;Decreased activity tolerance;Decreased balance;Decreased mobility;Decreased coordination;Pain       PT Treatment Interventions DME instruction;Gait training;Stair training;Functional mobility training;Therapeutic activities;Therapeutic exercise;Balance training;Neuromuscular re-education;Patient/family education;Modalities    PT Goals (Current goals can be found in the Care Plan section)  Acute Rehab PT Goals Patient Stated Goal: long walks with the dogs PT Goal Formulation: With patient Time For Goal Achievement: 12/09/22 Potential to Achieve Goals: Good    Frequency 7X/week     Co-evaluation               AM-PAC PT "6 Clicks" Mobility  Outcome  Measure Help needed turning from your back to your side while in a flat bed without using bedrails?: A Little Help needed moving from lying on your back to sitting on the side of a flat bed without using bedrails?: A Little Help needed moving to and from a bed to a chair (including a wheelchair)?: A Little Help needed standing up from a chair using your arms (e.g., wheelchair or bedside chair)?: A Little Help needed to walk in hospital room?: A Little Help needed climbing 3-5 steps with a railing? : Total 6 Click Score: 16    End of Session Equipment Utilized During Treatment: Gait belt Activity Tolerance: Treatment limited secondary to medical complications (Comment) (light headedness) Patient left: in chair;with call bell/phone within reach Nurse Communication: Mobility status;Other (comment) (made aware of pt physiological response with gait) PT Visit Diagnosis: Unsteadiness on feet (R26.81);Other abnormalities of gait and mobility (R26.89);Muscle weakness (generalized) (M62.81);Pain Pain - Right/Left: Right Pain - part of body: Knee    Time: [id number] PT Time Calculation (min) (ACUTE ONLY): 32 min   Charges:   PT Evaluation $PT Eval Low Complexity: 1 Low PT Treatments $Therapeutic Activity: 8-22 mins         Baird Lyons, PT   Adair Patter  11/25/2022, 6:22 PM

## 2022-11-25 NOTE — Transfer of Care (Signed)
Immediate Anesthesia Transfer of Care Note  Patient: Tammy Mills  Procedure(s) Performed: RIGHT TOTAL KNEE ARTHROPLASTY (Right: Knee)  Patient Location: PACU  Anesthesia Type:Spinal  Level of Consciousness: awake, alert , and oriented  Airway & Oxygen Therapy: Patient Spontanous Breathing and Patient connected to face mask oxygen  Post-op Assessment: Report given to RN and Post -op Vital signs reviewed and stable  Post vital signs: Reviewed and stable  Last Vitals:  Vitals Value Taken Time  BP 107/58 11/25/22 1155  Temp    Pulse 63 11/25/22 1156  Resp 16 11/25/22 1156  SpO2 100 % 11/25/22 1156  Vitals shown include unvalidated device data.  Last Pain:  Vitals:   11/25/22 0940  TempSrc:   PainSc: Asleep         Complications: No notable events documented.

## 2022-11-25 NOTE — Anesthesia Procedure Notes (Signed)
Spinal  Patient location during procedure: OR Start time: 11/25/2022 9:48 AM End time: 11/25/2022 9:52 AM Reason for block: surgical anesthesia Staffing Performed: resident/CRNA  Resident/CRNA: Niel Hummer, CRNA Performed by: Niel Hummer, CRNA Authorized by: Albertha Ghee, MD   Preanesthetic Checklist Completed: patient identified, IV checked, risks and benefits discussed, surgical consent, monitors and equipment checked, pre-op evaluation and timeout performed Spinal Block Patient position: sitting Prep: DuraPrep Patient monitoring: heart rate, cardiac monitor, continuous pulse ox and blood pressure Approach: midline Location: L3-4 Injection technique: single-shot Needle Needle type: Pencan  Needle gauge: 24 G Needle length: 10 cm Additional Notes Expiration date of [place] checked. Clear CSF flow prior to injection. Dr. Marcie Bal present.

## 2022-11-25 NOTE — Plan of Care (Signed)

## 2022-11-25 NOTE — Op Note (Signed)
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 Item Serial No. Manufacturer Lot No. LRB No. Used Action  CEMENT BONE R 1X40 - [id number] Cement CEMENT BONE R 1X40  ZIMMER RECON(ORTH,TRAU,BIO,SG) [id number] Right 2 Implanted  STEM TIB ST PERS 14+30 - [id number] Stem STEM TIB ST PERS 14+30  ZIMMER [employer]ON(ORTH,TRAU,BIO,SG) [id number] Right 1 Implanted  STEM POLY PAT PLY 41M KNEE - [id number] Knees STEM POLY PAT PLY 41M KNEE  ZIMMER RECON(ORTH,TRAU,BIO,SG) [id number] Right 1 Implanted  INSERT PSN CPS VE RIGHT 42M - [id number] Insert INSERT PSN CPS VE RIGHT 42M  ZIMMER RECON(ORTH,TRAU,BIO,SG) [id number] Right 1 Implanted  TIBIA STEM 5 DEG SZ E R KNEE - [id number] Knees TIBIA STEM 5 DEG SZ E R KNEE  ZIMMER RECON(ORTH,TRAU,BIO,SG) [id number] Right 1 Implanted  FEMUR  CMT CCR STD SZ9 R KNEE - [id number] Knees FEMUR  CMT CCR STD SZ9 R KNEE  ZIMMER RECON(ORTH,TRAU,BIO,SG) [id number] Right 1 Implanted   Surgeon: Lind Guest. Ninfa Linden, MD Assistant: Benita Stabile, PA-C  Anesthesia: #1 right lower extremity adductor canal block, #2 spinal, #3 local Tourniquet time: 1 hour EBL: Less than 100 cc Antibiotics: IV Ancef Complications: None  Indications: The patient is a 67 year old female with debilitating arthritis involving her right knee.  There is significant valgus malalignment of that knee as well.  She had similar situation with her left knee and last year we performed a left total knee arthroplasty and she has done very well with that left knee.  Her right knee is in need of knee replacement surgery based on her clinical exam and x-ray findings combined with the daily pain that is causing her as well as the detrimental effect it is having on her quality of life, activities day living  and mobility.  Having had this before she is fully aware of the risk and benefits of this type of surgery.  Procedure description: After informed consent was obtained and the appropriate right knee was marked, anesthesia obtained a right lower extremity adductor canal block in the holding room.  The patient was then brought to the operating room and sat up on the operating table where spinal anesthesia was obtained.  She was then laid in supine position on the operating table and a Foley catheter is placed.  A nonsterile tourniquet placed around her upper right thigh and right thigh, knee, leg and ankle were prepped and draped in DuraPrep and sterile drapes including a sterile stockinette.  A timeout was called and she was then applied as the correct patient the correct right knee.  We then used a Esmarch wrap out the leg and the tourniquet inflated to 300 mm of pressure.  We then made a direct midline incision over the patella and carried this proximally distally.  Dissection was carried down the knee joint and a medial parapatellar arthrotomy was made.  There is a moderate joint effusion encountered.  The knee was definitely significantly deformed specially with the lateral compartment given her significant valgus.  We remove remnants of the ACL and PCL as well as medial lateral meniscus.  The popliteus fortunately was intact.  We remove synovium from around the knee as well.  With the knee in a flexed position we used an extramedullary cutting guide for making her proximal tibia cut correction varus and valgus and taking 2 mm off the low side.  We then used an intramedullary guide for distal femoral cutting guide setting this for a right knee at 5 degrees externally rotated.  We made this cut without difficulty but then backed it down to full more millimeters.  With the knee fully extended at 10 mm of extension by give his full extension.  We then back to the femoral side of things and placed a femoral sizing  guide based off the epicondylar axis.  Based off of this we chose a size 9 femur.  We put a 4-in-1 cutting block for a size 9 femur and made her anterior posterior cuts followed by her chamfer cuts.  We then did our femoral box cut.  Attention was then turned back to the tibia.  We chose a size E tibia for coverage over the tibial plateau so the rotation off the tibial tubercle and the femur.  This was for right knee.  Of note we decided to perform a PS knee as opposed to a CR knee on this side given her significant valgus and the instability with a CR knee on the side when we first did our cuts.  We then did our drill hole and keel punch for a size E tibial tray.  We then trialed our size E tibial tray for right knee combined with our size 9 right standard PS femur.  We trialed a 10 and then a 12 mm fixed-bearing polythene insert and we are pleased with the range of motion and stability without insert.  We then made our patella cut and drilled 3 holes for a size 32 cemented patella button.  With all trial instrumentation the knee again we put her through several cycles of motion.  We then removed all instrumentation with the knee and irrigate the knee with normal saline solution.  We then placed our Marcaine with epinephrine around the arthrotomy.  Our cement was then mixed in with the knee in a flexed position we cemented our Biomet Zimmer persona tibial component with a stem given the constrained poly.  This was a size EE and went in easily.  We cemented our size 9 PS right standard femur.  We placed our fixed-bearing PS polyethylene insert for right knee size 12 mm thickness and cemented our size 32 patella button.  We then held the knee compressed and fully extended while the cement hardened.  Once it hardened with the tourniquet down and hemostasis was obtained with electrocautery.  We then closed the arthrotomy with interrupted #1 Vicryl suture followed by 0 Vicryl close deep tissue and 2-0 Vicryl close  subcutaneous tissue.  The skin was closed with staples.  Well-padded sterile dressing was applied.  She was taken recovery in stable addition with all final counts being correct and no complications noted.  Go-cart PA-C did assisted in the entire case from beginning to end and his assistance was medically necessary and crucial for soft tissue management and retraction, helping guide implant placement and a layered closure of the wound.

## 2022-11-25 NOTE — Plan of Care (Signed)

## 2022-11-25 NOTE — Anesthesia Procedure Notes (Signed)
Anesthesia Regional Block: Adductor canal block   Pre-Anesthetic Checklist: , timeout performed,  Correct Patient, Correct Site, Correct Laterality,  Correct Procedure, Correct Position, site marked,  Risks and benefits discussed,  Surgical consent,  Pre-op evaluation,  At surgeon's request and post-op pain management  Laterality: Right  Prep: chloraprep       Needles:  Injection technique: Single-shot  Needle Type: Echogenic Needle     Needle Length: 9cm  Needle Gauge: 21     Additional Needles:   Narrative:  Start time: 11/25/2022 9:34 AM End time: 11/25/2022 9:44 AM Injection made incrementally with aspirations every 5 mL.  Performed by: Personally  Anesthesiologist: Albertha Ghee, MD  Additional Notes: Pt tolerated the procedure well.

## 2022-11-25 NOTE — Interval H&P Note (Signed)
History and Physical Interval Note: The patient understands that she is here today for a right total knee replacement to treat her severe right knee arthritis.  There has been no acute or interval change in her medical status.  See H&P.  The risks and benefits of surgery been explained in detail and informed consent is obtained.  The right operative knee has been marked.  11/25/2022 8:35 AM  Tammy Mills  has presented today for surgery, with the diagnosis of ENDSTAGE OSTEOARTHRITIS RIGHT KNEE.  The various methods of treatment have been discussed with the patient and family. After consideration of risks, benefits and other options for treatment, the patient has consented to  Procedure(s): RIGHT TOTAL KNEE ARTHROPLASTY (Right) as a surgical intervention.  The patient's history has been reviewed, patient examined, no change in status, stable for surgery.  I have reviewed the patient's chart and labs.  Questions were answered to the patient's satisfaction.     Mcarthur Rossetti

## 2022-11-25 NOTE — Anesthesia Procedure Notes (Signed)
Procedure Name: MAC Date/Time: 11/25/2022 9:49 AM  Performed by: Niel Hummer, CRNAPre-anesthesia Checklist: Patient identified, Emergency Drugs available, Suction available and Patient being monitored Oxygen Delivery Method: Simple face mask

## 2022-11-26 DIAGNOSIS — M1711 Unilateral primary osteoarthritis, right knee: Secondary | ICD-10-CM | POA: Diagnosis not present

## 2022-11-26 LAB — BASIC METABOLIC PANEL
Anion gap: 7 (ref 5–15)
BUN: 13 mg/dL (ref 8–23)
CO2: 23 mmol/L (ref 22–32)
Calcium: 7.7 mg/dL — ABNORMAL LOW (ref 8.9–10.3)
Chloride: 104 mmol/L (ref 98–111)
Creatinine, Ser: 0.8 mg/dL (ref 0.44–1.00)
GFR, Estimated: >60 mL/min (ref >60)
Glucose, Bld: 124 mg/dL — ABNORMAL HIGH (ref 70–99)
Potassium: 4 mmol/L (ref 3.5–5.1)
Sodium: 134 mmol/L — ABNORMAL LOW (ref 135–145)

## 2022-11-26 LAB — CBC
HCT: 29.4 % — ABNORMAL LOW (ref 36.0–46.0)
Hemoglobin: 9.9 g/dL — ABNORMAL LOW (ref 12.0–15.0)
MCH: 31.9 pg (ref 26.0–34.0)
MCHC: 33.7 g/dL (ref 30.0–36.0)
MCV: 94.8 fL (ref 80.0–100.0)
Platelets: 207 10*3/uL (ref 150–400)
RBC: 3.1 MIL/uL — ABNORMAL LOW (ref 3.87–5.11)
RDW: 14 % (ref 11.5–15.5)
WBC: 8.4 10*3/uL (ref 4.0–10.5)
nRBC: 0 % (ref 0.0–0.2)

## 2022-11-26 MED ORDER — HYDROCODONE-ACETAMINOPHEN 5-325 MG PO TABS
1.0000 | ORAL_TABLET | ORAL | Status: DC | PRN
  Administered 2022-11-26 (×2): 2 via ORAL
  Administered 2022-11-26 – 2022-11-27 (×2): 1 via ORAL
  Filled 2022-11-26 (×3): qty 2
  Filled 2022-11-26 (×2): qty 1

## 2022-11-26 NOTE — Progress Notes (Signed)
Physical Therapy Treatment Patient Details Name: Tammy Mills MRN: [id number] DOB: 1956-04-19 Today's Date: 11/26/2022   History of Present Illness Pt is a 67yo female presenting s/p R TKA on 11/25/2022. Pt PMH includes but is not limited to: OP, Mitral valve prolapse and L TKA (04/01/2022).    PT Comments    Pt tolerated increased activity level this session, she ambulated 48' with a RW, distance limited by R thigh pain. Pt reported pain medication was changed and she now is not having lightheadedness. Good progress expected.     Recommendations for follow up therapy are one component of a multi-disciplinary discharge planning process, led by the attending physician.  Recommendations may be updated based on patient status, additional functional criteria and insurance authorization.  Follow Up Recommendations  Follow physician's recommendations for discharge plan and follow up therapies     Assistance Recommended at Discharge Intermittent Supervision/Assistance  Patient can return home with the following A little help with walking and/or transfers;A little help with bathing/dressing/bathroom;Assistance with cooking/housework;Assist for transportation;Help with stairs or ramp for entrance   Equipment Recommendations  None recommended by PT (pt has DME)    Recommendations for Other Services       Precautions / Restrictions Precautions Precautions: Knee;Fall Precaution Booklet Issued: Yes (comment) Precaution Comments: reviewed no pillow under knee Restrictions Weight Bearing Restrictions: No RLE Weight Bearing: Weight bearing as tolerated     Mobility  Bed Mobility Overal bed mobility: Needs Assistance Bed Mobility: Supine to Sit     Supine to sit: Min assist, HOB elevated     General bed mobility comments: min A to scoot hips to EOB    Transfers Overall transfer level: Needs assistance Equipment used: Rolling walker (2 wheels) Transfers: Sit to/from Stand Sit to Stand:  Min guard, From elevated surface   Step pivot transfers: Min guard       General transfer comment: cues for proper UE and AD placement    Ambulation/Gait Ambulation/Gait assistance: Min guard Gait Distance (Feet): 75 Feet Assistive device: Rolling walker (2 wheels) Gait Pattern/deviations: Step-to pattern, Antalgic Gait velocity: decreased     General Gait Details: VCs sequencing, no loss of balance   Stairs             Wheelchair Mobility    Modified Rankin (Stroke Patients Only)       Balance Overall balance assessment: Needs assistance Sitting-balance support: Feet supported Sitting balance-[name]: Fair     Standing balance support: During functional activity, Reliant on assistive device for balance Standing balance-[name]: Poor                              Cognition Arousal/Alertness: Awake/alert Behavior During Therapy: WFL for tasks assessed/performed Overall Cognitive Status: Within Functional Limits for tasks assessed                                          Exercises Total Joint Exercises Ankle Circles/Pumps: AROM, Both, 20 reps Quad Sets: AROM, Both, 5 reps, Supine Short Arc Quad: Right, 5 reps, Supine, AAROM Heel Slides: AAROM, Right, 5 reps, Supine Hip ABduction/ADduction: AAROM, Right, 5 reps, Supine Straight Leg Raises: AAROM, Right, 5 reps, Supine Knee Flexion: AAROM, Right, 10 reps, Seated    General Comments        Pertinent Vitals/Pain Pain Assessment Pain Score: 5  Pain Location: R thigh Pain Descriptors / Indicators: Constant, Aching, Operative site guarding, Discomfort Pain Intervention(s): Limited activity within patient's tolerance, Monitored during session, Premedicated before session, Ice applied    Home Living                          Prior Function            PT Goals (current goals can now be found in the care plan section) Acute Rehab PT Goals Patient Stated  Goal: long walks with the dogs PT Goal Formulation: With patient Time For Goal Achievement: 12/09/22 Potential to Achieve Goals: Good Progress towards PT goals: Progressing toward goals    Frequency    7X/week      PT Plan Current plan remains appropriate    Co-evaluation              AM-PAC PT "6 Clicks" Mobility   Outcome Measure  Help needed turning from your back to your side while in a flat bed without using bedrails?: A Little Help needed moving from lying on your back to sitting on the side of a flat bed without using bedrails?: A Little Help needed moving to and from a bed to a chair (including a wheelchair)?: A Little Help needed standing up from a chair using your arms (e.g., wheelchair or bedside chair)?: A Little Help needed to walk in hospital room?: A Little Help needed climbing 3-5 steps with a railing? : A Lot 6 Click Score: 17    End of Session Equipment Utilized During Treatment: Gait belt Activity Tolerance: Patient tolerated treatment well (light headedness) Patient left: in bed;with family/visitor present;with call bell/phone within reach Nurse Communication: Mobility status PT Visit Diagnosis: Other abnormalities of gait and mobility (R26.89);Muscle weakness (generalized) (M62.81);Pain;Difficulty in walking, not elsewhere classified (R26.2) Pain - Right/Left: Right Pain - part of body: Knee     Time: 1245-1306 PT Time Calculation (min) (ACUTE ONLY): 21 min  Charges:  $Gait Training: 8-22 mins        Blondell Reveal Kistler PT 11/26/2022  Acute Rehabilitation Services  Office [phone]

## 2022-11-26 NOTE — Progress Notes (Signed)
Subjective: 1 Day Post-Op Procedure(s) (LRB): RIGHT TOTAL KNEE ARTHROPLASTY (Right) Patient reports pain as moderate.  Right thigh most painful. Dizziness and nausea with pain med.   Objective: Vital signs in last 24 hours: Temp:  [97.5 F (36.4 C)-98.7 F (37.1 C)] 97.7 F (36.5 C) (03/23 0959) Pulse Rate:  [57-88] 78 (03/23 0959) Resp:  [14-20] 18 (03/23 0959) BP: (107-156)/(50-88) 110/50 (03/23 0959) SpO2:  [95 %-100 %] 100 % (03/23 0959)  Intake/Output from previous day: 03/22 0701 - 03/23 0700 In: 3586.3 [P.O.:720; I.V.:2512.3; IV Piggyback:353.9] Out: 2450 [Urine:2400; Blood:50] Intake/Output this shift: Total I/O In: 120 [P.O.:120] Out: -   Recent Labs    11/26/22 0327  HGB 9.9*   Recent Labs    11/26/22 0327  WBC 8.4  RBC 3.10*  HCT 29.4*  PLT 207   Recent Labs    11/26/22 0327  NA 134*  K 4.0  CL 104  CO2 23  BUN 13  CREATININE 0.80  GLUCOSE 124*  CALCIUM 7.7*   No results for input(s): "LABPT", "INR" in the last 72 hours.  Intact pulses distally Dorsiflexion/Plantar flexion intact Incision: moderate drainage Compartment soft   Assessment/Plan: 1 Day Post-Op Procedure(s) (LRB): RIGHT TOTAL KNEE ARTHROPLASTY (Right) Up with therapy Will change pain meds.  Possible discharge home tomorrow.      Tammy Mills 11/26/2022, 10:31 AM

## 2022-11-26 NOTE — Progress Notes (Addendum)
Physical Therapy Treatment Patient Details Name: Tammy Mills MRN: [id number] DOB: July 19, 1956 Today's Date: 11/26/2022   History of Present Illness Pt is a 67yo female presenting s/p R TKA on 11/25/2022 due to failure to thrive. Pt PMH includes but is not limited to: OP, Mitral valve prolapse and L TKA (04/01/2022).    PT Comments    Min A for supine to sit, while sitting EOB pt became lightheaded, returned to supine where vitals were WNL (see flowsheets). Instructed pt in TKA HEP. She then felt well enough to transfer to recliner. Will attempt ambulation this afternoon. Pt would like to try a different pain medication or reduce dosage of the oxycodone, RN notified.     Recommendations for follow up therapy are one component of a multi-disciplinary discharge planning process, led by the attending physician.  Recommendations may be updated based on patient status, additional functional criteria and insurance authorization.  Follow Up Recommendations  Home health PT     Assistance Recommended at Discharge Intermittent Supervision/Assistance  Patient can return home with the following A little help with walking and/or transfers;A little help with bathing/dressing/bathroom;Assistance with cooking/housework;Assist for transportation;Help with stairs or ramp for entrance   Equipment Recommendations  None recommended by PT (pt has DME)    Recommendations for Other Services       Precautions / Restrictions Precautions Precautions: Knee;Fall Restrictions; reviewed no pillow under knee, handout issued Weight Bearing Restrictions: No RLE Weight Bearing: Weight bearing as tolerated     Mobility  Bed Mobility Overal bed mobility: Needs Assistance Bed Mobility: Supine to Sit     Supine to sit: Min assist, HOB elevated     General bed mobility comments: min A to scoot hips to EOB    Transfers Overall transfer level: Needs assistance Equipment used: Rolling walker (2 wheels) Transfers:  Sit to/from Stand, Bed to chair/wheelchair/BSC Sit to Stand: Min guard   Step pivot transfers: Min guard       General transfer comment: cues for proper UE and AD placement; lightheaded sitting EOB so returned to supine where vitals were WNL (see flowsheets), pt performed HEP then felt well enough to transfer to recliner    Ambulation/Gait               General Gait Details: deferred 2* lightheaded in sitting   Stairs             Wheelchair Mobility    Modified Rankin (Stroke Patients Only)       Balance Overall balance assessment: Needs assistance Sitting-balance support: Feet supported Sitting balance-[name]: Fair     Standing balance support: During functional activity, Reliant on assistive device for balance Standing balance-[name]: Poor                              Cognition Arousal/Alertness: Awake/alert Behavior During Therapy: WFL for tasks assessed/performed Overall Cognitive Status: Within Functional Limits for tasks assessed                                          Exercises Total Joint Exercises Ankle Circles/Pumps: AROM, Both, 20 reps Quad Sets: AROM, Both, 5 reps, Supine Short Arc Quad: Right, 5 reps, Supine, AAROM Heel Slides: AAROM, Right, 5 reps, Supine Hip ABduction/ADduction: AAROM, Right, 5 reps, Supine Straight Leg Raises: AAROM, Right, 5 reps, Supine Knee  Flexion: AAROM, Right, 10 reps, Seated    General Comments        Pertinent Vitals/Pain Pain Assessment Pain Score: 4  Pain Location: R thigh Pain Descriptors / Indicators: Constant, Aching, Operative site guarding, Discomfort Pain Intervention(s): Limited activity within patient's tolerance, Monitored during session, Premedicated before session, Ice applied    Home Living                          Prior Function            PT Goals (current goals can now be found in the care plan section) Acute Rehab PT  Goals Patient Stated Goal: long walks with the dogs PT Goal Formulation: With patient Time For Goal Achievement: 12/09/22 Potential to Achieve Goals: Good Progress towards PT goals: Progressing toward goals    Frequency    7X/week      PT Plan      Co-evaluation              AM-PAC PT "6 Clicks" Mobility   Outcome Measure  Help needed turning from your back to your side while in a flat bed without using bedrails?: A Little Help needed moving from lying on your back to sitting on the side of a flat bed without using bedrails?: A Little Help needed moving to and from a bed to a chair (including a wheelchair)?: A Little Help needed standing up from a chair using your arms (e.g., wheelchair or bedside chair)?: A Little Help needed to walk in hospital room?: A Little Help needed climbing 3-5 steps with a railing? : A Lot 6 Click Score: 17    End of Session Equipment Utilized During Treatment: Gait belt Activity Tolerance: Treatment limited secondary to medical complications (Comment) (light headedness) Patient left: in chair;with call bell/phone within reach Nurse Communication: Mobility status;Other (comment) (made aware of pt physiological response with gait) PT Visit Diagnosis: Unsteadiness on feet (R26.81);Other abnormalities of gait and mobility (R26.89);Muscle weakness (generalized) (M62.81);Pain Pain - Right/Left: Right Pain - part of body: Knee     Time: [id number] PT Time Calculation (min) (ACUTE ONLY): 40 min  Charges:  $Gait Training: 8-22 mins $Therapeutic Exercise: 8-22 mins $Therapeutic Activity: 8-22 mins                     Tammy Mills PT 11/26/2022  Acute Rehabilitation Services  Office [phone]

## 2022-11-26 NOTE — Discharge Instructions (Signed)

## 2022-11-27 DIAGNOSIS — M1711 Unilateral primary osteoarthritis, right knee: Secondary | ICD-10-CM | POA: Diagnosis not present

## 2022-11-27 MED ORDER — HYDROCODONE-ACETAMINOPHEN 5-325 MG PO TABS: 1.0000 | ORAL_TABLET | Freq: Four times a day (QID) | ORAL | 0 refills | Status: DC | PRN

## 2022-11-27 MED ORDER — ONDANSETRON 4 MG PO TBDP: 4.0000 mg | ORAL_TABLET | Freq: Three times a day (TID) | ORAL | 0 refills | Status: AC | PRN

## 2022-11-27 MED ORDER — ASPIRIN 81 MG PO CHEW: 81.0000 mg | CHEWABLE_TABLET | Freq: Two times a day (BID) | ORAL | 0 refills | Status: AC

## 2022-11-27 MED ORDER — TIZANIDINE HCL 2 MG PO TABS: 2.0000 mg | ORAL_TABLET | Freq: Four times a day (QID) | ORAL | 0 refills | Status: DC | PRN

## 2022-11-27 NOTE — Progress Notes (Signed)
Subjective: 2 Days Post-Op Procedure(s) (LRB): RIGHT TOTAL KNEE ARTHROPLASTY (Right) Patient reports pain as moderate.    Objective: Vital signs in last 24 hours: Temp:  [97.7 F (36.5 C)-99.2 F (37.3 C)] 98.3 F (36.8 C) (03/24 0646) Pulse Rate:  [75-87] 81 (03/24 0646) Resp:  [18] 18 (03/24 0646) BP: (110-141)/(50-69) 133/57 (03/24 0646) SpO2:  [95 %-100 %] 95 % (03/24 0646)  Intake/Output from previous day: 03/23 0701 - 03/24 0700 In: 600 [P.O.:600] Out: -  Intake/Output this shift: No intake/output data recorded.  Recent Labs    11/26/22 0327  HGB 9.9*   Recent Labs    11/26/22 0327  WBC 8.4  RBC 3.10*  HCT 29.4*  PLT 207   Recent Labs    11/26/22 0327  NA 134*  K 4.0  CL 104  CO2 23  BUN 13  CREATININE 0.80  GLUCOSE 124*  CALCIUM 7.7*   No results for input(s): "LABPT", "INR" in the last 72 hours.  Sensation intact distally Intact pulses distally Dorsiflexion/Plantar flexion intact Incision: dressing C/D/I No cellulitis present Compartment soft   Assessment/Plan: 2 Days Post-Op Procedure(s) (LRB): RIGHT TOTAL KNEE ARTHROPLASTY (Right) Up with therapy Discharge home with home health today.      Mcarthur Rossetti 11/27/2022, 8:18 AM

## 2022-11-27 NOTE — Anesthesia Postprocedure Evaluation (Signed)
Anesthesia Post Note  Patient: Tammy Mills  Procedure(s) Performed: RIGHT TOTAL KNEE ARTHROPLASTY (Right: Knee)     Patient location during evaluation: PACU Anesthesia Type: MAC, Spinal and Regional Level of consciousness: oriented and awake and alert Pain management: pain level controlled Vital Signs Assessment: post-procedure vital signs reviewed and stable Respiratory status: spontaneous breathing, respiratory function stable and patient connected to nasal cannula oxygen Cardiovascular status: blood pressure returned to baseline and stable Postop Assessment: no headache, no backache and no apparent nausea or vomiting Anesthetic complications: no   No notable events documented.  Last Vitals:  Vitals:   11/26/22 2153 11/27/22 0646  BP: 138/69 (!) 133/57  Pulse: 87 81  Resp: 18 18  Temp: 37.3 C 36.8 C  SpO2: 97% 95%    Last Pain:  Vitals:   11/27/22 0646  TempSrc: Oral  PainSc:                  [place]

## 2022-11-27 NOTE — Plan of Care (Signed)
  Problem: Education: Goal: Knowledge of General Education information will improve Description: Including pain rating scale, medication(s)/side effects and non-pharmacologic comfort measures Outcome: Progressing   Problem: Health Behavior/Discharge Planning: Goal: Ability to manage health-related needs will improve Outcome: Progressing   Problem: Activity: Goal: Risk for activity intolerance will decrease Outcome: Progressing   

## 2022-11-27 NOTE — Progress Notes (Signed)
Physical Therapy Treatment Patient Details Name: Tammy Mills MRN: [id number] DOB: 1956/04/04 Today's Date: 11/27/2022   History of Present Illness Pt is a 67yo female presenting s/p R TKA on 11/25/2022. Pt PMH includes but is not limited to: OP, Mitral valve prolapse and L TKA (04/01/2022).    PT Comments    Pt ambulated in hallway and practiced safe stair technique.  Pt provided with stair handout.  Pt looked over HEP handout and had no further questions on exercises.  Pt eager and ready for d/c home today.    Recommendations for follow up therapy are one component of a multi-disciplinary discharge planning process, led by the attending physician.  Recommendations may be updated based on patient status, additional functional criteria and insurance authorization.  Follow Up Recommendations  Follow physician's recommendations for discharge plan and follow up therapies     Assistance Recommended at Discharge Intermittent Supervision/Assistance  Patient can return home with the following A little help with walking and/or transfers;A little help with bathing/dressing/bathroom;Assistance with cooking/housework;Assist for transportation;Help with stairs or ramp for entrance   Equipment Recommendations  None recommended by PT    Recommendations for Other Services       Precautions / Restrictions Precautions Precautions: Knee;Fall Restrictions RLE Weight Bearing: Weight bearing as tolerated     Mobility  Bed Mobility               General bed mobility comments: pt in recliner    Transfers Overall transfer level: Needs assistance Equipment used: Rolling walker (2 wheels) Transfers: Sit to/from Stand Sit to Stand: Supervision           General transfer comment: cues for hand placement    Ambulation/Gait Ambulation/Gait assistance: Min guard, Supervision Gait Distance (Feet): 300 Feet Assistive device: Rolling walker (2 wheels) Gait Pattern/deviations: Step-to  pattern, Antalgic, Decreased stance time - right       General Gait Details: verbal cues for RW positioning and step length   Stairs Stairs: Yes Stairs assistance: Min guard Stair Management: Step to pattern, Backwards, With walker Number of Stairs: 4 General stair comments: verbal cues for sequence and safety, provided handout on technique   Wheelchair Mobility    Modified Rankin (Stroke Patients Only)       Balance                                            Cognition Arousal/Alertness: Awake/alert Behavior During Therapy: WFL for tasks assessed/performed Overall Cognitive Status: Within Functional Limits for tasks assessed                                          Exercises      General Comments        Pertinent Vitals/Pain Pain Assessment Pain Assessment: 0-10 Pain Score: 5  Pain Location: R thigh Pain Descriptors / Indicators: Aching, Sore Pain Intervention(s): Repositioned, Monitored during session, Ice applied    Home Living                          Prior Function            PT Goals (current goals can now be found in the care plan section) Progress towards PT goals: Progressing toward goals  Frequency    7X/week      PT Plan Current plan remains appropriate    Co-evaluation              AM-PAC PT "6 Clicks" Mobility   Outcome Measure  Help needed turning from your back to your side while in a flat bed without using bedrails?: A Little Help needed moving from lying on your back to sitting on the side of a flat bed without using bedrails?: A Little Help needed moving to and from a bed to a chair (including a wheelchair)?: A Little Help needed standing up from a chair using your arms (e.g., wheelchair or bedside chair)?: A Little Help needed to walk in hospital room?: A Little Help needed climbing 3-5 steps with a railing? : A Little 6 Click Score: 18    End of Session Equipment  Utilized During Treatment: Gait belt Activity Tolerance: Patient tolerated treatment well Patient left: in chair;with call bell/phone within reach;with chair alarm set Nurse Communication: Mobility status PT Visit Diagnosis: Other abnormalities of gait and mobility (R26.89);Muscle weakness (generalized) (M62.81)     Time: [id number] PT Time Calculation (min) (ACUTE ONLY): 19 min  Charges:  $Gait Training: 8-22 mins                     Jannette Spanner PT, DPT Physical Therapist Acute Rehabilitation Services Preferred contact method: Secure Chat Weekend Pager Only: [phone] Office: [place] 11/27/2022, 5:14 PM

## 2022-11-27 NOTE — Discharge Summary (Signed)
Patient IDMatoya Mills MRN: [id number] DOB/AGE: 04-28-1956 67 y.o.  Admit date: 11/25/2022 Discharge date: 11/27/2022  Admission Diagnoses:  Principal Problem:   Unilateral primary osteoarthritis, right knee Active Problems:   Status post total right knee replacement   Discharge Diagnoses:  Same  Past Medical History:  Diagnosis Date   Arthritis    GERD (gastroesophageal reflux disease)    Heart murmur    Mitral valve prolapse    mild to moderate regurgitation   Osteoporosis     Surgeries: Procedure(s): RIGHT TOTAL KNEE ARTHROPLASTY on 11/25/2022   Consultants:   Discharged Condition: Improved  Hospital Course: Tammy Mills is an 67 y.o. female who was admitted 11/25/2022 for operative treatment ofUnilateral primary osteoarthritis, right knee. Patient has severe unremitting pain that affects sleep, daily activities, and work/hobbies. After pre-op clearance the patient was taken to the operating room on 11/25/2022 and underwent  Procedure(s): RIGHT TOTAL KNEE ARTHROPLASTY.    Patient was given perioperative antibiotics:  Anti-infectives (From admission, onward)    Start     Dose/Rate Route Frequency Ordered Stop   11/25/22 1600  ceFAZolin (ANCEF) IVPB 1 g/50 mL premix        1 g 100 mL/hr over 30 Minutes Intravenous Every 6 hours 11/25/22 1547 11/25/22 2228   11/25/22 0800  ceFAZolin (ANCEF) IVPB 2g/100 mL premix        2 g 200 mL/hr over 30 Minutes Intravenous On call to O.R. 11/25/22 0747 11/25/22 1023        Patient was given sequential compression devices, early ambulation, and chemoprophylaxis to prevent DVT.  Patient benefited maximally from hospital stay and there were no complications.    Recent vital signs: Patient Vitals for the past 24 hrs:  BP Temp Temp src Pulse Resp SpO2  11/27/22 0646 (!) 133/57 98.3 F (36.8 C) Oral 81 18 95 %  11/26/22 2153 138/69 99.2 F (37.3 C) Oral 87 18 97 %  11/26/22 1402 (!) 141/63 98.4 F (36.9 C) Oral 79 18 100 %   11/26/22 0959 (!) 110/50 97.7 F (36.5 C) Oral 78 18 100 %  11/26/22 0909 (!) 122/59 -- -- 75 -- 98 %     Recent laboratory studies:  Recent Labs    11/26/22 0327  WBC 8.4  HGB 9.9*  HCT 29.4*  PLT 207  NA 134*  K 4.0  CL 104  CO2 23  BUN 13  CREATININE 0.80  GLUCOSE 124*  CALCIUM 7.7*     Discharge Medications:   Allergies as of 11/27/2022       Reactions   Fosamax [alendronate]    Mouth sores    Penicillins Other (See Comments)   "doesn't work well with me" per patient.        Medication List     STOP taking these medications    Comirnaty syringe Generic drug: COVID-19 mRNA vaccine 2023-2024       TAKE these medications    aspirin 81 MG chewable tablet Chew 1 tablet (81 mg total) by mouth 2 (two) times daily.   B-12 5000 MCG Caps Take 5,000 mcg by mouth daily.   CALCIUM 600+D PO Take 1 tablet by mouth 2 (two) times daily.   docusate sodium 100 MG capsule Commonly known as: COLACE Take 100 mg by mouth daily as needed for mild constipation.   famotidine-calcium carbonate-magnesium hydroxide 10-800-165 MG chewable tablet Commonly known as: PEPCID COMPLETE Chew 1 tablet by mouth daily as needed (heartburn).   FISH OIL  PO Take 900 mg by mouth daily.   HYDROcodone-acetaminophen 5-325 MG tablet Commonly known as: NORCO/VICODIN Take 1-2 tablets by mouth every 6 (six) hours as needed for moderate pain.   L-Lysine 1000 MG Tabs Take 1,000 mg by mouth at bedtime.   loratadine 10 MG tablet Commonly known as: CLARITIN Take 10 mg by mouth daily as needed for allergies.   Melatonin 10 MG Tabs Take 10 mg by mouth at bedtime as needed (for sleep).   meloxicam 7.5 MG tablet Commonly known as: MOBIC TAKE 1 TABLET BY MOUTH EVERY MORNING AND EVERY NIGHT AT BEDTIME   multivitamin tablet Take 1 tablet by mouth daily.   naproxen sodium 220 MG tablet Commonly known as: ALEVE Take 660 mg by mouth daily as needed (knee pain).   ondansetron 4 MG  disintegrating tablet Commonly known as: ZOFRAN-ODT Take 1 tablet (4 mg total) by mouth every 8 (eight) hours as needed for nausea or vomiting.   tiZANidine 2 MG tablet Commonly known as: ZANAFLEX Take 1 tablet (2 mg total) by mouth every 6 (six) hours as needed for muscle spasms.               Durable Medical Equipment  (From admission, onward)           Start     Ordered   11/25/22 1548  DME 3 n 1  Once        11/25/22 1547   11/25/22 1548  DME Walker rolling  Once       Question Answer Comment  Walker: With 5 Inch Wheels   Patient needs a walker to treat with the following condition Status post total right knee replacement      11/25/22 1547            Diagnostic Studies: DG Knee Right Port  Result Date: 11/25/2022 CLINICAL DATA:  Status post right knee arthroplasty. EXAM: PORTABLE RIGHT KNEE - 1-2 VIEW COMPARISON:  Right knee radiographs 01/24/2022 FINDINGS: Interval total right knee arthroplasty. No perihardware lucency is seen to indicate hardware failure or loosening. Expected small joint effusion with intra-articular and subcutaneous postsurgical air. Anterior surgical skin staples. No acute fracture or dislocation. IMPRESSION: Interval total right knee arthroplasty without evidence of hardware failure. Electronically Signed   By: Yvonne Kendall M.D.   On: 11/25/2022 13:43    Disposition: Discharge disposition: 01-Home or [facility]     Mcarthur Rossetti, MD Follow up in 2 week(s).   Specialty: Orthopedic Surgery Contact information: [address] [place] [address] [phone]                  Signed: Mcarthur Rossetti 11/27/2022, 8:20 AM

## 2022-11-27 NOTE — Progress Notes (Signed)
The patient is alert and oriented and has been seen by her physician. The orders for discharge were written. IV has been removed. Went over discharge instructions with patient. She is being discharged via wheelchair with all of her belongings.   

## 2022-11-28 ENCOUNTER — Other Ambulatory Visit: Payer: Self-pay

## 2022-11-28 ENCOUNTER — Ambulatory Visit: Payer: Medicare Other | Attending: Orthopaedic Surgery | Admitting: Rehabilitative and Restorative Service Providers"

## 2022-11-28 ENCOUNTER — Encounter: Payer: Self-pay | Admitting: Rehabilitative and Restorative Service Providers"

## 2022-11-28 DIAGNOSIS — M25561 Pain in right knee: Secondary | ICD-10-CM | POA: Diagnosis present

## 2022-11-28 DIAGNOSIS — G8929 Other chronic pain: Secondary | ICD-10-CM | POA: Insufficient documentation

## 2022-11-28 DIAGNOSIS — R600 Localized edema: Secondary | ICD-10-CM | POA: Diagnosis not present

## 2022-11-28 DIAGNOSIS — M25661 Stiffness of right knee, not elsewhere classified: Secondary | ICD-10-CM | POA: Diagnosis not present

## 2022-11-28 DIAGNOSIS — R262 Difficulty in walking, not elsewhere classified: Secondary | ICD-10-CM | POA: Diagnosis not present

## 2022-11-28 DIAGNOSIS — M6281 Muscle weakness (generalized): Secondary | ICD-10-CM | POA: Insufficient documentation

## 2022-11-29 ENCOUNTER — Encounter (HOSPITAL_COMMUNITY): Payer: Self-pay | Admitting: Orthopaedic Surgery

## 2022-11-29 ENCOUNTER — Telehealth: Payer: Self-pay | Admitting: *Deleted

## 2022-11-29 NOTE — Telephone Encounter (Signed)
Ortho bundle D/C call completed; Patient doing well and has started OPPT.

## 2022-11-30 ENCOUNTER — Ambulatory Visit: Payer: Medicare Other

## 2022-11-30 DIAGNOSIS — G8929 Other chronic pain: Secondary | ICD-10-CM

## 2022-11-30 DIAGNOSIS — M25661 Stiffness of right knee, not elsewhere classified: Secondary | ICD-10-CM

## 2022-11-30 DIAGNOSIS — R262 Difficulty in walking, not elsewhere classified: Secondary | ICD-10-CM

## 2022-11-30 DIAGNOSIS — R600 Localized edema: Secondary | ICD-10-CM

## 2022-11-30 DIAGNOSIS — M6281 Muscle weakness (generalized): Secondary | ICD-10-CM

## 2022-11-30 NOTE — Therapy (Signed)
OUTPATIENT PHYSICAL THERAPY LOWER EXTREMITY TREATMENT   Patient Name: Tammy Mills MRN: [id number] DOB:1956/01/26, 67 y.o., female Today's Date: 11/30/2022  END OF SESSION:  PT End of Session - 11/30/22 1149     Visit Number 2    Number of Visits 20    Date for PT Re-Evaluation 01/19/23    Authorization Type medicare and [employer]    Progress Note Due on Visit 10    PT Start Time 1150    PT Stop Time 1234    PT Time Calculation (min) 44 min    Activity Tolerance Patient tolerated treatment well    Behavior During Therapy WFL for tasks assessed/performed             Past Medical History:  Diagnosis Date   Arthritis    GERD (gastroesophageal reflux disease)    Heart murmur    Mitral valve prolapse    mild to moderate regurgitation   Osteoporosis    Past Surgical History:  Procedure Laterality Date   BREAST EXCISIONAL BIOPSY Right    20 + yrs ago, benign   BREAST SURGERY     COLONOSCOPY     TOTAL KNEE ARTHROPLASTY Left 04/01/2022   Procedure: LEFT TOTAL KNEE ARTHROPLASTY;  Surgeon: Mcarthur Rossetti, MD;  Location: [facility];  Service: Orthopedics;  Laterality: Left;   TOTAL KNEE ARTHROPLASTY Right 11/25/2022   Procedure: RIGHT TOTAL KNEE ARTHROPLASTY;  Surgeon: Mcarthur Rossetti, MD;  Location: [facility];  Service: Orthopedics;  Laterality: Right;   WISDOM TOOTH EXTRACTION     Patient Active Problem List   Diagnosis Date Noted   Status post total right knee replacement 11/25/2022   Unilateral primary osteoarthritis, right knee 11/24/2022   Status post total left knee replacement 04/01/2022   Osteoporosis    Mitral valve prolapse    Osteopenia 05/07/2013   Routine general medical examination at a health care facility 04/30/2013   Right knee pain 05/08/2012   Right hip pain 05/08/2012   Knee pain, bilateral 06/03/2011   WEIGHT GAIN 06/28/2010    PCP: Dr Jenna Luo  REFERRING PROVIDER: Dr Jean Rosenthal  REFERRING DIAG: Rt TKA  THERAPY DIAG:   Chronic pain of right knee  Stiffness of right knee, not elsewhere classified  Muscle weakness (generalized)  Difficulty in walking, not elsewhere classified  Localized edema  Rationale for Evaluation and Treatment: Rehabilitation  ONSET DATE: surgery 11/25/22  SUBJECTIVE:   SUBJECTIVE STATEMENT: Patient reports 2/10 pain in R knee today, states she continues to have pain at tourniquet site. Patient states has   PERTINENT HISTORY: Arthritis; heart murmur PAIN:  Are you having pain? Yes: NPRS scale: 3/10 Pain location: Rt knee  Pain description: aching Aggravating factors: moving knee; bending knee  Relieving factors: ice; meds  PRECAUTIONS: Post op Total Knee Arthroplasty   WEIGHT BEARING RESTRICTIONS: No  FALLS:  Has patient fallen in last 6 months? 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  No acute fracture. Valgus deformity has progressed since prior films.  Osteochondroma remains  unchanged patella notch area.  Moderate patellofemoral arthritic changes.   COGNITION: Overall cognitive status: Within functional limits for tasks assessed     SENSATION: WFL  EDEMA:  Moderate edema Rt knee and thigh  MUSCLE LENGTH: Hamstrings: Right 65 deg; Left 70 deg Hip flexors: Right tight   POSTURE: rounded shoulders, forward head, and flexed trunk    LOWER EXTREMITY  ROM: assessed in supine   Active ROM Right eval Left eval  Hip flexion    Hip extension    Hip abduction    Hip adduction    Hip internal rotation    Hip external rotation    Knee flexion 65 122  Knee extension -11 0  Ankle dorsiflexion    Ankle plantarflexion    Ankle inversion    Ankle eversion     (Blank rows = not tested)  LOWER EXTREMITY MMT:  MMT Right eval Left eval  Hip flexion NT 5  Hip extension NT 5  Hip abduction NT 5  Hip adduction NT 5  Hip internal rotation    Hip external rotation    Knee flexion NT 5  Knee extension NT 5  Ankle dorsiflexion    Ankle plantarflexion    Ankle inversion    Ankle eversion     (Blank rows = not tested)   FUNCTIONAL TESTS:    GAIT: Distance walked: 40 ft x 2 Assistive device utilized: Walker - 2 wheeled Level of assistance: Complete Independence Comments: fwd flexed trunk; limp Rt LE in wt bearing Rt with decreased stance time Rt; decreased hip/knee bend in swing phase of gait    [facility] Adult PT Treatment:                                                DATE: 11/30/2022 Therapeutic Exercise: Quad set 10x5" SAQ 10x5" Small range SLR x2 independent --> 2x5 w/strap assist AAROM heel slide w/strap 10x10" Seated R HS stretch w/strap (heel propped on stool) Supine ITB stretch w/strap  Seated knee stretch with forward shifting 10x5" Counter: Standing lateral weight shifting + glute activation/postural awareness Stride stance (R forward) weight shifting    [facility] Adult PT Treatment:                                                DATE: 11/28/22 Therapeutic Exercise: Supine  Quad set 5 sec x 10 SLR 5 sec s 10 (pt assisting with stretch out strap) Hip ab/adduction leg supported on pillow x 10  Sitting  Knee flexion feet unsupported dangling feet  Standing  Standing posture contraction of gluts and quads to work on trunk/hip/knee extension  Standing weight shift working on release of Rt knee with slight heel lift   Therapeutic Activity: Working on standing at home during [employer] of 30 min program 2x/day Gait: Encouraged release of Rt knee and equal stride length  Modalities: Ice machine at home  Self Care: Use of strap to assist with moving Rt LE for transfers as needed    PATIENT EDUCATION:  Education details: Updated  HEP  Person educated: Patient Education method: Explanation, Demonstration, Tactile cues, Verbal cues, and Handouts Education comprehension: verbalized understanding, returned demonstration, verbal cues required, tactile cues required, and needs further education  HOME EXERCISE PROGRAM: Access Code: [email or web address] URL: [email or web address] Date: 11/30/2022 Prepared by: Helane Gunther  Exercises - Supine Quad Set  - 2 x daily - 7 x weekly - 1 sets - 10 reps - 3 sec  hold - Small Range Straight Leg Raise  - 2 x daily - 7 x weekly - 1 sets - 10 reps - 5 sec  hold - Supine Hip Abduction  - 2 x daily - 7 x weekly - 1 sets - 10 reps - 3 sec  hold - Supine Heel Slide with Strap  - 2 x daily - 7 x weekly - 1 sets - 5-10 reps - 10 sec  hold - Seated Knee Flexion Stretch  - 1 x daily - 7 x weekly - 3 sets - 10 reps - Standing Hip Abduction with Counter Support  - 1 x daily - 7 x weekly - 3 sets - 10 reps - Standing Hip Extension with Counter Support  - 1 x daily - 7 x weekly - 3 sets - 10 reps - Staggered Stance Forward Backward Weight Shift with Counter Support  - 1 x daily - 7 x weekly - 3 sets - 10 reps  ASSESSMENT:  CLINICAL IMPRESSION: AROM knee flexion improved to 80 degrees; patient has mild-mod discomfort with transition from knee flexion to extension during supine active assisted heel slides. Cueing provided to improve glute activation and total knee extension in weight bearing with weight shifting in stride stance.   OBJECTIVE IMPAIRMENTS: Abnormal gait, decreased activity tolerance, decreased mobility, difficulty walking, decreased ROM, decreased strength,  hypomobility, increased edema, increased fascial restrictions, impaired flexibility, improper body mechanics, postural dysfunction, and pain.   ACTIVITY LIMITATIONS: carrying, lifting, bending, sitting, standing, squatting, sleeping, stairs, transfers, bed mobility, bathing, toileting, and dressing  PARTICIPATION LIMITATIONS: meal prep, cleaning, laundry, driving, shopping, community activity, and yard work  PERSONAL FACTORS: Past/current experiences, Time since onset of injury/illness/exacerbation, and comorbidities including are also affecting patient's functional outcome.   REHAB POTENTIAL: Good  CLINICAL DECISION MAKING: Stable/uncomplicated  EVALUATION COMPLEXITY: Low   GOALS: Goals reviewed with patient? Yes  SHORT TERM GOALS: Target date: 12/22/2022  Patient independent in initial HEP  Baseline: Goal status: INITIAL  2.  Patient independent in all transfers sit >/< stand </> supine  Baseline:  Goal status: INITIAL    LONG TERM GOALS: Target date: 01/19/2023   Increase AROM Rt knee to 0 degrees extension to 120 degrees flexion  Baseline:  Goal status: INITIAL  2.  4+/5 to 5/5 strength Rt LE Baseline:  Goal status: INITIAL  3.  Improved gait pattern with patient ambulatory for functional and community distances of 500-700 ft  Baseline:  Goal status: INITIAL  4.  Patient to report ability to walk dog for 1 mile with appropriate plan to work toward walking 3 miles   Baseline:  Goal status: INITIAL  5.  Independent in all transfers including floor </> chair  Baseline:  Goal status: INITIAL  6.  Independent in HEP including aquatic exercise program as indicated  Baseline:  Goal status: INITIAL   PLAN:  PT FREQUENCY: 2-3x/week  PT DURATION: 8 weeks  PLANNED INTERVENTIONS: Therapeutic exercises, Therapeutic activity, Neuromuscular re-education, Balance training, Gait training, Patient/Family education, Self Care, Joint mobilization, Aquatic Therapy, Dry  Needling, Electrical stimulation, Cryotherapy, Moist heat,  scar mobilization, Taping, Ultrasound, Ionotophoresis 4mg /ml Dexamethasone, Manual therapy, and Re-evaluation  PLAN FOR NEXT SESSION: Progress exercises; gait training; manual work, DN, modalities as indicated   Hardin Negus, PTA 11/30/2022, 12:35 PM

## 2022-12-02 ENCOUNTER — Other Ambulatory Visit: Payer: Self-pay | Admitting: Orthopaedic Surgery

## 2022-12-05 ENCOUNTER — Encounter: Payer: Self-pay | Admitting: Rehabilitative and Restorative Service Providers"

## 2022-12-05 ENCOUNTER — Ambulatory Visit: Payer: Medicare Other | Attending: Orthopaedic Surgery | Admitting: Rehabilitative and Restorative Service Providers"

## 2022-12-05 DIAGNOSIS — R600 Localized edema: Secondary | ICD-10-CM

## 2022-12-05 DIAGNOSIS — M25561 Pain in right knee: Secondary | ICD-10-CM | POA: Insufficient documentation

## 2022-12-05 DIAGNOSIS — M25662 Stiffness of left knee, not elsewhere classified: Secondary | ICD-10-CM | POA: Diagnosis present

## 2022-12-05 DIAGNOSIS — M25661 Stiffness of right knee, not elsewhere classified: Secondary | ICD-10-CM

## 2022-12-05 DIAGNOSIS — R262 Difficulty in walking, not elsewhere classified: Secondary | ICD-10-CM | POA: Diagnosis present

## 2022-12-05 DIAGNOSIS — G8929 Other chronic pain: Secondary | ICD-10-CM

## 2022-12-05 DIAGNOSIS — M25562 Pain in left knee: Secondary | ICD-10-CM | POA: Diagnosis present

## 2022-12-05 DIAGNOSIS — M6281 Muscle weakness (generalized): Secondary | ICD-10-CM

## 2022-12-05 NOTE — Therapy (Addendum)
OUTPATIENT PHYSICAL THERAPY LOWER EXTREMITY TREATMENT   Patient Name: Tammy Mills MRN: [id number] DOB:04/11/1956, 67 y.o., female Today's Date: 12/05/2022  END OF SESSION:  PT End of Session - 12/05/22 1453     Visit Number 3    Number of Visits 20    Date for PT Re-Evaluation 01/19/23    Authorization Type medicare and [employer]    Progress Note Due on Visit 10    PT Start Time [id number]    PT Stop Time 1530    PT Time Calculation (min) 45 min    Equipment Utilized During Treatment Gait belt             Past Medical History:  Diagnosis Date   Arthritis    GERD (gastroesophageal reflux disease)    Heart murmur    Mitral valve prolapse    mild to moderate regurgitation   Osteoporosis    Past Surgical History:  Procedure Laterality Date   BREAST EXCISIONAL BIOPSY Right    20 + yrs ago, benign   BREAST SURGERY     COLONOSCOPY     TOTAL KNEE ARTHROPLASTY Left 04/01/2022   Procedure: LEFT TOTAL KNEE ARTHROPLASTY;  Surgeon: Mcarthur Rossetti, MD;  Location: [facility];  Service: Orthopedics;  Laterality: Left;   TOTAL KNEE ARTHROPLASTY Right 11/25/2022   Procedure: RIGHT TOTAL KNEE ARTHROPLASTY;  Surgeon: Mcarthur Rossetti, MD;  Location: [facility];  Service: Orthopedics;  Laterality: Right;   WISDOM TOOTH EXTRACTION     Patient Active Problem List   Diagnosis Date Noted   Status post total right knee replacement 11/25/2022   Unilateral primary osteoarthritis, right knee 11/24/2022   Status post total left knee replacement 04/01/2022   Osteoporosis    Mitral valve prolapse    Osteopenia 05/07/2013   Routine general medical examination at a health care facility 04/30/2013   Right knee pain 05/08/2012   Right hip pain 05/08/2012   Knee pain, bilateral 06/03/2011   WEIGHT GAIN 06/28/2010    PCP: Dr Jenna Luo  REFERRING PROVIDER: Dr Jean Rosenthal  REFERRING DIAG: Rt TKA  THERAPY DIAG:  Chronic pain of right knee  Stiffness of right knee, not elsewhere  classified  Muscle weakness (generalized)  Difficulty in walking, not elsewhere classified  Localized edema  Rationale for Evaluation and Treatment: Rehabilitation  ONSET DATE: surgery 11/25/22  SUBJECTIVE:   SUBJECTIVE STATEMENT: Patient reports that she is working on exercises at home. She is using her ice 2-3/day. She continues to have pain at tourniquet site.   PERTINENT HISTORY: Arthritis; heart murmur; Lt TKA 7/23 PAIN:  Are you having pain? Yes: NPRS scale: 2-3/10 Pain location: Rt knee  Pain description: aching Aggravating factors: moving knee; bending knee  Relieving factors: ice; meds  PRECAUTIONS: Post op Total Knee Arthroplasty   WEIGHT BEARING RESTRICTIONS: No  FALLS:  Has patient fallen in last 6 months? 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  No acute fracture. Valgus deformity has progressed since prior films.  Osteochondroma remains  unchanged patella notch area.  Moderate patellofemoral arthritic changes.    EDEMA:  Moderate edema Rt knee and thigh  MUSCLE LENGTH: Hamstrings: Right 65 deg; Left 70 deg Hip flexors: Right tight   POSTURE: rounded shoulders, forward head, and flexed trunk    LOWER EXTREMITY ROM: assessed in supine   Active ROM Right eval Left eval  Hip flexion    Hip extension    Hip abduction    Hip adduction    Hip internal rotation    Hip  external rotation    Knee flexion 65 122  Knee extension -11 0  Ankle dorsiflexion    Ankle plantarflexion    Ankle inversion    Ankle eversion     (Blank rows = not tested)  LOWER EXTREMITY MMT:  MMT Right eval Left eval  Hip flexion NT 5  Hip extension NT 5  Hip abduction NT 5  Hip adduction NT 5  Hip internal rotation    Hip external rotation    Knee flexion NT 5  Knee extension NT 5  Ankle dorsiflexion    Ankle plantarflexion    Ankle inversion    Ankle eversion     (Blank rows = not tested)   FUNCTIONAL TESTS:    GAIT: Distance walked: 40 ft x 2 Assistive device utilized: Walker - 2 wheeled Level of assistance: Complete Independence Comments: fwd flexed trunk; limp Rt LE in wt bearing Rt with decreased stance time Rt; decreased hip/knee bend in swing phase of gait   [facility] Adult PT Treatment:                                                DATE: 12/05/2022 Therapeutic Exercise: Recumbent bike for ROM x 7 min partial revolution only  Side steps at counter 10 feet x 10  Partial knee bend at counter 10 x 2  Sit to stand feet even, minimal UE assist x 5  Knee flexion at 12 inch step leaning fwd to increase knee flexion 10 sec x 5 Tapping toe to 12 inch step alternating Lt/Rt x 10 each  Step up 6 in Rt up x 10  HS stretch supine with strap pressing knee straight with one hand 30 sec x 3  Quad set 5 sec x 10; repeated with heel on foam roll 5 sec x 10  SAQ 5 sec x 10  Small range SLR x2 independent --> 2x5 w/strap assist AAROM heel slide w/strap 10x10" Manual:   Assisted knee extension with overpressure 10 sec x 3   [facility] Adult PT Treatment:                                                DATE: 11/30/2022 Therapeutic Exercise: Quad set 10x5" SAQ 10x5" Small range SLR x2 independent --> 2x5 w/strap assist AAROM heel slide w/strap 10x10" Seated R HS stretch w/strap (heel propped on stool) Supine ITB stretch w/strap  Seated knee stretch with forward shifting  10x5" Counter: Standing lateral weight shifting + glute activation/postural awareness Stride stance (R forward) weight shifting   PATIENT EDUCATION:  Education details: Updated HEP  Person educated: Patient Education method: Explanation, Demonstration, Tactile cues, Verbal cues, and Handouts Education comprehension: verbalized understanding, returned demonstration, verbal cues required, tactile cues required, and needs further education  HOME EXERCISE PROGRAM: Access Code: [email or web address] URL: [email or web address] Date: 12/05/2022 Prepared by: Gillermo Murdoch  Exercises - Supine Quad Set  - 2 x daily - 7 x weekly - 1 sets - 10 reps - 3 sec  hold - Small Range Straight Leg Raise  - 2 x daily - 7 x weekly - 1 sets - 10 reps - 5 sec  hold - Supine Hip Abduction  - 2 x daily - 7 x weekly - 1 sets - 10 reps - 3 sec  hold - Supine Heel Slide with Strap  - 2 x daily - 7 x weekly - 1 sets - 5-10 reps - 10 sec  hold - Seated Knee Flexion Stretch  - 1 x daily - 7 x weekly - 3 sets - 10 reps - Standing Hip Abduction with Counter Support  - 1 x daily - 7 x weekly - 3 sets - 10 reps - Standing Hip Extension with Counter Support  - 1 x daily - 7 x weekly - 3 sets - 10 reps - Staggered Stance Forward Backward Weight Shift with Counter Support  - 1 x daily - 7 x weekly - 3 sets - 10 reps - Side Stepping with Counter Support  - 2 x daily - 7 x weekly - 1 sets - 3 reps - 30 sec  hold - Mini Squat with Counter Support  - 2 x daily - 7 x weekly - 1-2 sets - 10 reps - 3 sec  hold - Standing Knee Flexion Stretch on Step  - 2 x daily - 7 x weekly - 1 sets - 5-10 reps - 10-20 sec  hold - Standing Marching  - 2 x daily - 7 x weekly - 1-2 sets - 10 reps - Step Up  - 2 x daily - 7 x weekly - 1 sets - 10 reps - 2 sec  hold  ASSESSMENT:  CLINICAL IMPRESSION: Progressing with exercises with more exercises in standing using body weight for strengthening and ROM. Worked on sit to stand with knees flexed to  work on knee ROM. Note to MD at next visit.   OBJECTIVE IMPAIRMENTS: Abnormal gait, decreased activity tolerance, decreased mobility, difficulty walking, decreased ROM, decreased strength, hypomobility, increased edema, increased fascial restrictions, impaired flexibility, improper body mechanics, postural dysfunction, and pain.    GOALS: Goals reviewed with patient? Yes  SHORT TERM GOALS: Target date: 12/22/2022  Patient independent in initial HEP  Baseline: Goal status: INITIAL  2.  Patient independent in all transfers sit >/< stand </> supine  Baseline:  Goal status: INITIAL    LONG TERM GOALS: Target date: 01/19/2023   Increase AROM Rt knee to 0 degrees extension to 120 degrees flexion  Baseline:  Goal status: INITIAL  2.  4+/5 to 5/5 strength Rt LE Baseline:  Goal status: INITIAL  3.  Improved gait pattern with patient ambulatory for functional and community distances of 500-700 ft  Baseline:  Goal status: INITIAL  4.  Patient to report ability to walk dog for 1 mile with appropriate plan to work toward walking 3 miles   Baseline:  Goal status: INITIAL  5.  Independent in all transfers including floor </> chair  Baseline:  Goal status: INITIAL  6.  Independent in HEP including aquatic exercise program  as indicated  Baseline:  Goal status: INITIAL   PLAN:  PT FREQUENCY: 2-3x/week  PT DURATION: 8 weeks  PLANNED INTERVENTIONS: Therapeutic exercises, Therapeutic activity, Neuromuscular re-education, Balance training, Gait training, Patient/Family education, Self Care, Joint mobilization, Aquatic Therapy, Dry Needling, Electrical stimulation, Cryotherapy, Moist heat, scar mobilization, Taping, Ultrasound, Ionotophoresis 4mg /ml Dexamethasone, Manual therapy, and Re-evaluation  PLAN FOR NEXT SESSION: Progress exercises; gait training; manual work, DN, modalities as indicated   [employer], PT 12/05/2022, 2:54 PM

## 2022-12-07 ENCOUNTER — Ambulatory Visit: Payer: Medicare Other | Admitting: Rehabilitative and Restorative Service Providers"

## 2022-12-07 DIAGNOSIS — R262 Difficulty in walking, not elsewhere classified: Secondary | ICD-10-CM

## 2022-12-07 DIAGNOSIS — G8929 Other chronic pain: Secondary | ICD-10-CM

## 2022-12-07 DIAGNOSIS — R600 Localized edema: Secondary | ICD-10-CM

## 2022-12-07 DIAGNOSIS — M25661 Stiffness of right knee, not elsewhere classified: Secondary | ICD-10-CM

## 2022-12-07 DIAGNOSIS — M6281 Muscle weakness (generalized): Secondary | ICD-10-CM

## 2022-12-07 DIAGNOSIS — M25561 Pain in right knee: Secondary | ICD-10-CM | POA: Diagnosis not present

## 2022-12-07 NOTE — Therapy (Signed)
OUTPATIENT PHYSICAL THERAPY LOWER EXTREMITY TREATMENT   Patient Name: Tammy Mills MRN: [id number] DOB:07/09/1956, 67 y.o., female Today's Date: 12/07/2022  END OF SESSION:  PT End of Session - 12/07/22 1403     Visit Number 4    Number of Visits 20    Date for PT Re-Evaluation 01/19/23    Authorization Type medicare and [employer]    Progress Note Due on Visit 10    PT Start Time 1402    PT Stop Time 1447    PT Time Calculation (min) 45 min    Activity Tolerance Patient tolerated treatment well             Past Medical History:  Diagnosis Date   Arthritis    GERD (gastroesophageal reflux disease)    Heart murmur    Mitral valve prolapse    mild to moderate regurgitation   Osteoporosis    Past Surgical History:  Procedure Laterality Date   BREAST EXCISIONAL BIOPSY Right    20 + yrs ago, benign   BREAST SURGERY     COLONOSCOPY     TOTAL KNEE ARTHROPLASTY Left 04/01/2022   Procedure: LEFT TOTAL KNEE ARTHROPLASTY;  Surgeon: Mcarthur Rossetti, MD;  Location: [facility];  Service: Orthopedics;  Laterality: Left;   TOTAL KNEE ARTHROPLASTY Right 11/25/2022   Procedure: RIGHT TOTAL KNEE ARTHROPLASTY;  Surgeon: Mcarthur Rossetti, MD;  Location: [facility];  Service: Orthopedics;  Laterality: Right;   WISDOM TOOTH EXTRACTION     Patient Active Problem List   Diagnosis Date Noted   Status post total right knee replacement 11/25/2022   Unilateral primary osteoarthritis, right knee 11/24/2022   Status post total left knee replacement 04/01/2022   Osteoporosis    Mitral valve prolapse    Osteopenia 05/07/2013   Routine general medical examination at a health care facility 04/30/2013   Right knee pain 05/08/2012   Right hip pain 05/08/2012   Knee pain, bilateral 06/03/2011   WEIGHT GAIN 06/28/2010    PCP: Dr Jenna Luo  REFERRING PROVIDER: Dr Jean Rosenthal  REFERRING DIAG: Rt TKA  THERAPY DIAG:  Chronic pain of right knee  Stiffness of right knee, not  elsewhere classified  Muscle weakness (generalized)  Difficulty in walking, not elsewhere classified  Localized edema  Rationale for Evaluation and Treatment: Rehabilitation  ONSET DATE: surgery 11/25/22  SUBJECTIVE:   SUBJECTIVE STATEMENT: Patient reports that she is working on exercises at home. She is using her ice 2-3/day. She continues to have pain on the inside of the thigh from the bruise at tourniquet site. Feels she is ready to progress to the cane.   PERTINENT HISTORY: Arthritis; heart murmur; Lt TKA 7/23 PAIN:  Are you having pain? Yes: NPRS scale: 2-3/10 Pain location: Rt knee  Pain description: aching Aggravating factors: moving knee; bending knee  Relieving factors: ice; meds  PRECAUTIONS: Post op Total Knee Arthroplasty   WEIGHT BEARING RESTRICTIONS: No  FALLS:  Has patient fallen in last 6 months? 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  No acute fracture. Valgus deformity has progressed since prior films.  Osteochondroma remains  unchanged patella notch area.  Moderate patellofemoral arthritic changes.    EDEMA:  Moderate edema Rt knee and thigh  MUSCLE LENGTH: Hamstrings: Right 65 deg; Left 70 deg Hip flexors: Right tight   POSTURE: rounded shoulders, forward head, and flexed trunk    LOWER EXTREMITY ROM: assessed in supine   Active ROM Right eval Right  12/06/22  Left eval  Hip flexion     Hip extension     Hip abduction     Hip adduction     Hip internal rotation     Hip external rotation     Knee flexion 65 95 122  Knee extension -11 -6 0  Ankle dorsiflexion     Ankle plantarflexion     Ankle inversion     Ankle eversion      (Blank rows = not tested)  LOWER EXTREMITY MMT:  MMT Right eval Left eval  Hip flexion NT 5  Hip extension NT 5  Hip abduction NT 5  Hip adduction NT 5  Hip internal rotation    Hip external rotation    Knee flexion NT 5  Knee extension NT 5  Ankle dorsiflexion    Ankle plantarflexion    Ankle inversion    Ankle eversion     (Blank rows = not tested)   FUNCTIONAL TESTS:    GAIT: Distance walked: 120 ft x 2 Assistive device utilized: SPC Level of assistance: SBA  Comments: step through gait pattern with improving weight shift and weight bearing Rt LE   [facility] Adult PT Treatment:                                                DATE: 12/07/2022 Therapeutic Exercise: Nustep L6 x 7 min Quad set 10 sec x 5 Quad set heel supported 5 sec x 5  SLR 5 sec x 5  SAQ 5 sec x 5 AAROM heel slide w/strap 10x10" Side steps at counter 10 feet x 10  Partial knee bend at counter 10 x 2  Hip extension in standing x 10 Rt/Lt  Hip abduction in standing leading with heel  Sit to stand feet even, minimal UE assist x 5  Knee flexion at 12 inch step leaning fwd to increase knee flexion 10 sec x 5 Tapping toe to 12 inch step alternating Lt/Rt x 10 each  Step up 6 in Rt up x 10  Gait:  Gait with standard cane level and carpeted surfaces x ~ 250 ft CGA to SBA step through gait Manual:   Assisted knee extension with overpressure 10 sec x 3  [facility] Adult PT Treatment:                                                DATE: 12/05/2022 Therapeutic Exercise: Recumbent bike for ROM x 7 min partial revolution only  Side steps at counter 10 feet x 10  Partial knee bend at counter 10 x 2  Sit to stand feet  even, minimal UE assist x  5  Knee flexion at 12 inch step leaning fwd to increase knee flexion 10 sec x 5 Tapping toe to 12 inch step alternating Lt/Rt x 10 each  Step up 6 in Rt up x 10  HS stretch supine with strap pressing knee straight with one hand 30 sec x 3  Quad set 5 sec x 10; repeated with heel on foam roll 5 sec x 10  SAQ 5 sec x 10  Small range SLR x2 independent --> 2x5 w/strap assist AAROM heel slide w/strap 10x10" Manual:   Assisted knee extension with overpressure 10 sec x 3    PATIENT EDUCATION:  Education details: Updated HEP  Person educated: Patient Education method: Explanation, Demonstration, Tactile cues, Verbal cues, and Handouts Education comprehension: verbalized understanding, returned demonstration, verbal cues required, tactile cues required, and needs further education  HOME EXERCISE PROGRAM: Access Code: [email or web address] URL: [email or web address] Date: 12/07/2022 Prepared by: Gillermo Murdoch  Exercises - Supine Quad Set  - 2 x daily - 7 x weekly - 1 sets - 10 reps - 3 sec  hold - Small Range Straight Leg Raise  - 2 x daily - 7 x weekly - 1 sets - 10 reps - 5 sec  hold - Supine Hip Abduction  - 2 x daily - 7 x weekly - 1 sets - 10 reps - 3 sec  hold - Supine Heel Slide with Strap  - 2 x daily - 7 x weekly - 1 sets - 5-10 reps - 10 sec  hold - Seated Knee Flexion Stretch  - 1 x daily - 7 x weekly - 3 sets - 10 reps - Standing Hip Abduction with Counter Support  - 1 x daily - 7 x weekly - 3 sets - 10 reps - Standing Hip Extension with Counter Support  - 1 x daily - 7 x weekly - 3 sets - 10 reps - Staggered Stance Forward Backward Weight Shift with Counter Support  - 1 x daily - 7 x weekly - 3 sets - 10 reps - Side Stepping with Counter Support  - 2 x daily - 7 x weekly - 1 sets - 3 reps - 30 sec  hold - Mini Squat with Counter Support  - 2 x daily - 7 x weekly - 1-2 sets - 10 reps - 3 sec  hold - Standing Knee Flexion Stretch on Step  - 2 x daily - 7 x weekly - 1 sets - 5-10  reps - 10-20 sec  hold - Standing Marching  - 2 x daily - 7 x weekly - 1-2 sets - 10 reps - Step Up  - 2 x daily - 7 x weekly - 1 sets - 10 reps - 2 sec  hold - Standing Hip Extension with Counter Support  - 2 x daily - 7 x weekly - 1-2 sets - 10 reps - 3-5 sec  hold - Standing Hip Abduction with Counter Support  - 2 x daily - 7 x weekly - 2-3 sets - 10 reps - 2-3 sec  hold  ASSESSMENT:  CLINICAL IMPRESSION: Progressing with exercises in standing using body weight for strengthening and ROM as well as supine and sitting. Gait training with SPC. Progressing well with ROM, strength, functional abilities and gait.     OBJECTIVE IMPAIRMENTS: Abnormal gait, decreased activity tolerance, decreased mobility, difficulty walking, decreased ROM, decreased strength, hypomobility, increased edema, increased fascial restrictions, impaired flexibility, improper body mechanics, postural dysfunction, and  pain.    GOALS: Goals reviewed with patient? Yes  SHORT TERM GOALS: Target date: 12/22/2022  Patient independent in initial HEP  Baseline: Goal status: INITIAL  2.  Patient independent in all transfers sit >/< stand </> supine  Baseline:  Goal status: INITIAL    LONG TERM GOALS: Target date: 01/19/2023   Increase AROM Rt knee to 0 degrees extension to 120 degrees flexion  Baseline:  Goal status: INITIAL  2.  4+/5 to 5/5 strength Rt LE Baseline:  Goal status: INITIAL  3.  Improved gait pattern with patient ambulatory for functional and community distances of 500-700 ft  Baseline:  Goal status: INITIAL  4.  Patient to report ability to walk dog for 1 mile with appropriate plan to work toward walking 3 miles   Baseline:  Goal status: INITIAL  5.  Independent in all transfers including floor </> chair  Baseline:  Goal status: INITIAL  6.  Independent in HEP including aquatic exercise program as indicated  Baseline:  Goal status: INITIAL   PLAN:  PT FREQUENCY: 2-3x/week  PT  DURATION: 8 weeks  PLANNED INTERVENTIONS: Therapeutic exercises, Therapeutic activity, Neuromuscular re-education, Balance training, Gait training, Patient/Family education, Self Care, Joint mobilization, Aquatic Therapy, Dry Needling, Electrical stimulation, Cryotherapy, Moist heat, scar mobilization, Taping, Ultrasound, Ionotophoresis 4mg /ml Dexamethasone, Manual therapy, and Re-evaluation  PLAN FOR NEXT SESSION: Progress exercises; gait training; manual work, DN, modalities as indicated   [employer], PT 12/07/2022, 2:09 PM

## 2022-12-08 ENCOUNTER — Ambulatory Visit (INDEPENDENT_AMBULATORY_CARE_PROVIDER_SITE_OTHER): Payer: Medicare Other | Admitting: Orthopaedic Surgery

## 2022-12-08 ENCOUNTER — Ambulatory Visit: Payer: Medicare Other

## 2022-12-08 ENCOUNTER — Encounter: Payer: Self-pay | Admitting: Orthopaedic Surgery

## 2022-12-08 DIAGNOSIS — Z96651 Presence of right artificial knee joint: Secondary | ICD-10-CM

## 2022-12-08 MED ORDER — HYDROCODONE-ACETAMINOPHEN 5-325 MG PO TABS: 1.0000 | ORAL_TABLET | Freq: Four times a day (QID) | ORAL | 0 refills | Status: DC | PRN

## 2022-12-08 NOTE — Progress Notes (Signed)
Tammy Mills is here for her first postoperative visit status post a right total knee arthroplasty.  She has a history of a left knee in the past.  She is 67 years old she is already in outpatient physical therapy and said she was able to flex her knee to 95 degrees yesterday.  She does need a refill of hydrocodone.  She is doing well overall.  Her calfs are soft.  She can stop her baby aspirin twice daily.  Her right knee lacks full extension of about 3 to 5 degrees and we can flex her to past 90 degrees.  The staples are removed and Steri-Strips applied.  The incision looks good.  She will continue increase her activities through outpatient physical therapy.  She needs to wait at least 2 more weeks minimal before driving.  She also needs to not have a narcotic in her system when she gets behind the wheel.  I will send in some more hydrocodone for her.  Will see her back in 4 weeks to see how she is doing overall from a range of motion standpoint but no x-rays are needed.

## 2022-12-09 ENCOUNTER — Telehealth: Payer: Self-pay | Admitting: *Deleted

## 2022-12-09 NOTE — Telephone Encounter (Signed)
Ortho bundle in office meeting completed. 

## 2022-12-12 ENCOUNTER — Ambulatory Visit: Payer: Medicare Other

## 2022-12-12 DIAGNOSIS — G8929 Other chronic pain: Secondary | ICD-10-CM

## 2022-12-12 DIAGNOSIS — M25661 Stiffness of right knee, not elsewhere classified: Secondary | ICD-10-CM

## 2022-12-12 DIAGNOSIS — R600 Localized edema: Secondary | ICD-10-CM

## 2022-12-12 DIAGNOSIS — M6281 Muscle weakness (generalized): Secondary | ICD-10-CM

## 2022-12-12 DIAGNOSIS — M25662 Stiffness of left knee, not elsewhere classified: Secondary | ICD-10-CM

## 2022-12-12 DIAGNOSIS — M25561 Pain in right knee: Secondary | ICD-10-CM | POA: Diagnosis not present

## 2022-12-12 DIAGNOSIS — R262 Difficulty in walking, not elsewhere classified: Secondary | ICD-10-CM

## 2022-12-12 NOTE — Therapy (Signed)
OUTPATIENT PHYSICAL THERAPY LOWER EXTREMITY TREATMENT   Patient Name: Tammy Mills MRN: [id number] DOB:October 08, 1955, 67 y.o., female Today's Date: 12/12/2022  END OF SESSION:  PT End of Session - 12/12/22 1447     Visit Number 5    Number of Visits 20    Date for PT Re-Evaluation 01/19/23    Authorization Type medicare and [employer]    Progress Note Due on Visit 10    PT Start Time 1445    PT Stop Time 1527    PT Time Calculation (min) 42 min    Activity Tolerance Patient tolerated treatment well    Behavior During Therapy WFL for tasks assessed/performed             Past Medical History:  Diagnosis Date   Arthritis    GERD (gastroesophageal reflux disease)    Heart murmur    Mitral valve prolapse    mild to moderate regurgitation   Osteoporosis    Past Surgical History:  Procedure Laterality Date   BREAST EXCISIONAL BIOPSY Right    20 + yrs ago, benign   BREAST SURGERY     COLONOSCOPY     TOTAL KNEE ARTHROPLASTY Left 04/01/2022   Procedure: LEFT TOTAL KNEE ARTHROPLASTY;  Surgeon: Kathryne Hitch, MD;  Location: [facility];  Service: Orthopedics;  Laterality: Left;   TOTAL KNEE ARTHROPLASTY Right 11/25/2022   Procedure: RIGHT TOTAL KNEE ARTHROPLASTY;  Surgeon: Kathryne Hitch, MD;  Location: [facility];  Service: Orthopedics;  Laterality: Right;   WISDOM TOOTH EXTRACTION     Patient Active Problem List   Diagnosis Date Noted   Status post total right knee replacement 11/25/2022   Status post total left knee replacement 04/01/2022   Osteoporosis    Mitral valve prolapse    Osteopenia 05/07/2013   Routine general medical examination at a health care facility 04/30/2013   Right knee pain 05/08/2012   Right hip pain 05/08/2012   Knee pain, bilateral 06/03/2011   WEIGHT GAIN 06/28/2010    PCP: Dr Lynnea Ferrier  REFERRING PROVIDER: Dr Doneen Poisson  REFERRING DIAG: Rt TKA  THERAPY DIAG:  Chronic pain of right knee  Stiffness of right knee, not  elsewhere classified  Muscle weakness (generalized)  Difficulty in walking, not elsewhere classified  Localized edema  Chronic pain of left knee  Stiffness of left knee, not elsewhere classified  Rationale for Evaluation and Treatment: Rehabilitation  ONSET DATE: surgery 11/25/22  SUBJECTIVE:   SUBJECTIVE STATEMENT: Patient reports 3/10 pain in knee today, states the pain at tourniquet site is decreasing. Patient states she is primarily using SPC. Patient reports staples were removed and steri strips placed on incision.   PERTINENT HISTORY: Arthritis; heart murmur; Lt TKA 7/23 PAIN:  Are you having pain? Yes: NPRS scale: 2-3/10 Pain location: Rt knee  Pain description: aching Aggravating factors: moving knee; bending knee  Relieving factors: ice; meds  PRECAUTIONS: Post op Total Knee Arthroplasty   WEIGHT BEARING RESTRICTIONS: No  FALLS:  Has patient fallen in last 6 months? 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  No acute fracture. Valgus deformity has progressed since prior films.  Osteochondroma remains  unchanged patella notch area.  Moderate patellofemoral arthritic changes.    EDEMA:  Moderate edema Rt knee and thigh  MUSCLE LENGTH: Hamstrings: Right 65 deg; Left 70 deg Hip flexors: Right tight   POSTURE: rounded shoulders, forward head, and flexed trunk    LOWER EXTREMITY  ROM: assessed in supine   Active ROM Right eval Right  12/06/22 Left eval Right 12/12/22  Hip flexion      Hip extension      Hip abduction      Hip adduction      Hip internal rotation      Hip external rotation      Knee flexion 65 95 122 104  Knee extension -11 -6 0 -4  Ankle dorsiflexion      Ankle plantarflexion      Ankle inversion      Ankle eversion       (Blank rows = not tested)  LOWER EXTREMITY MMT:  MMT Right eval Left eval  Hip flexion NT 5  Hip extension NT 5  Hip abduction NT 5  Hip adduction NT 5  Hip internal rotation    Hip external rotation    Knee flexion NT 5  Knee extension NT 5  Ankle dorsiflexion    Ankle plantarflexion    Ankle inversion    Ankle eversion     (Blank rows = not tested)   FUNCTIONAL TESTS:    GAIT: Distance walked: 120 ft x 2 Assistive device utilized: SPC Level of assistance: SBA  Comments: step through gait pattern with improving weight shift and weight bearing Rt LE    [facility] Adult PT Treatment:                                                DATE: 12/12/2022 Therapeutic Exercise: Recumbent bike partial revolutions x AAROM heel slides w/strap 15x5" Quad set 10x5" --> heel propped on towel roll x5 SAQ 10x5" Seated knee flexion stretch 5x10" LAQ 4#AW 10x5" Side stepping (counter) YTB  Standing squats 2x10 Standing hip ext x10 --> back diagonal (abd+ext) x10   [facility] Adult PT Treatment:                                                DATE: 12/07/2022 Therapeutic Exercise: Nustep L6 x 7 min Quad set 10 sec x 5 Quad set heel supported 5 sec x 5  SLR 5 sec x 5  SAQ 5 sec x 5 AAROM heel slide w/strap 10x10" Side steps at counter 10 feet x 10  Partial knee bend at counter 10 x 2  Hip extension in standing x 10 Rt/Lt  Hip abduction in standing leading with heel  Sit to stand feet even, minimal UE assist x 5  Knee flexion at 12 inch step leaning fwd to increase knee flexion 10 sec x 5 Tapping toe to 12 inch step  alternating Lt/Rt x 10 each  Step up  6 in Rt up x 10  Gait:  Gait with standard cane level and carpeted surfaces x ~ 250 ft CGA to SBA step through gait Manual:   Assisted knee extension with overpressure 10 sec x 3     PATIENT EDUCATION:  Education details: Updated HEP  Person educated: Patient Education method: Explanation, Demonstration, Tactile cues, Verbal cues, and Handouts Education comprehension: verbalized understanding, returned demonstration, verbal cues required, tactile cues required, and needs further education  HOME EXERCISE PROGRAM: Access Code: [email or web address] URL: [email or web address] Date: 12/07/2022 Prepared by: Corlis Leakelyn Holt  Exercises - Supine Quad Set  - 2 x daily - 7 x weekly - 1 sets - 10 reps - 3 sec  hold - Small Range Straight Leg Raise  - 2 x daily - 7 x weekly - 1 sets - 10 reps - 5 sec  hold - Supine Hip Abduction  - 2 x daily - 7 x weekly - 1 sets - 10 reps - 3 sec  hold - Supine Heel Slide with Strap  - 2 x daily - 7 x weekly - 1 sets - 5-10 reps - 10 sec  hold - Seated Knee Flexion Stretch  - 1 x daily - 7 x weekly - 3 sets - 10 reps - Standing Hip Abduction with Counter Support  - 1 x daily - 7 x weekly - 3 sets - 10 reps - Standing Hip Extension with Counter Support  - 1 x daily - 7 x weekly - 3 sets - 10 reps - Staggered Stance Forward Backward Weight Shift with Counter Support  - 1 x daily - 7 x weekly - 3 sets - 10 reps - Side Stepping with Counter Support  - 2 x daily - 7 x weekly - 1 sets - 3 reps - 30 sec  hold - Mini Squat with Counter Support  - 2 x daily - 7 x weekly - 1-2 sets - 10 reps - 3 sec  hold - Standing Knee Flexion Stretch on Step  - 2 x daily - 7 x weekly - 1 sets - 5-10 reps - 10-20 sec  hold - Standing Marching  - 2 x daily - 7 x weekly - 1-2 sets - 10 reps - Step Up  - 2 x daily - 7 x weekly - 1 sets - 10 reps - 2 sec  hold - Standing Hip Extension with Counter Support  - 2 x daily - 7 x weekly - 1-2 sets - 10 reps -  3-5 sec  hold - Standing Hip Abduction with Counter Support  - 2 x daily - 7 x weekly - 2-3 sets - 10 reps - 2-3 sec  hold  ASSESSMENT:  CLINICAL IMPRESSION: Patient is making good progress with R knee AROM (see above for measurements). Hip strengthening continued with light resistance added during side stepping. Patient able to complete all exercises with no exacerbation of symptoms.    OBJECTIVE IMPAIRMENTS: Abnormal gait, decreased activity tolerance, decreased mobility, difficulty walking, decreased ROM, decreased strength, hypomobility, increased edema, increased fascial restrictions, impaired flexibility, improper body mechanics, postural dysfunction, and pain.    GOALS: Goals reviewed with patient? Yes  SHORT TERM GOALS: Target date: 12/22/2022  Patient independent in initial HEP  Baseline: Goal status: INITIAL  2.  Patient independent in all transfers sit >/< stand </> supine  Baseline:  Goal status: INITIAL    LONG TERM GOALS: Target date: 01/19/2023  Increase AROM Rt knee to 0 degrees extension to 120 degrees  flexion  Baseline:  Goal status: INITIAL  2.  4+/5 to 5/5 strength Rt LE Baseline:  Goal status: INITIAL  3.  Improved gait pattern with patient ambulatory for functional and community distances of 500-700 ft  Baseline:  Goal status: INITIAL  4.  Patient to report ability to walk dog for 1 mile with appropriate plan to work toward walking 3 miles   Baseline:  Goal status: INITIAL  5.  Independent in all transfers including floor </> chair  Baseline:  Goal status: INITIAL  6.  Independent in HEP including aquatic exercise program as indicated  Baseline:  Goal status: INITIAL   PLAN:  PT FREQUENCY: 2-3x/week  PT DURATION: 8 weeks  PLANNED INTERVENTIONS: Therapeutic exercises, Therapeutic activity, Neuromuscular re-education, Balance training, Gait training, Patient/Family education, Self Care, Joint mobilization, Aquatic Therapy, Dry Needling,  Electrical stimulation, Cryotherapy, Moist heat, scar mobilization, Taping, Ultrasound, Ionotophoresis 4mg /ml Dexamethasone, Manual therapy, and Re-evaluation  PLAN FOR NEXT SESSION: Progress exercises; gait training; manual work, DN, modalities as indicated   Carlynn Herald, PTA 12/12/2022 2:50PM

## 2022-12-15 ENCOUNTER — Ambulatory Visit: Payer: Medicare Other | Admitting: Rehabilitative and Restorative Service Providers"

## 2022-12-15 ENCOUNTER — Encounter: Payer: Self-pay | Admitting: Rehabilitative and Restorative Service Providers"

## 2022-12-15 DIAGNOSIS — M25561 Pain in right knee: Secondary | ICD-10-CM | POA: Diagnosis not present

## 2022-12-15 DIAGNOSIS — R262 Difficulty in walking, not elsewhere classified: Secondary | ICD-10-CM

## 2022-12-15 DIAGNOSIS — M6281 Muscle weakness (generalized): Secondary | ICD-10-CM

## 2022-12-15 DIAGNOSIS — R600 Localized edema: Secondary | ICD-10-CM

## 2022-12-15 DIAGNOSIS — G8929 Other chronic pain: Secondary | ICD-10-CM

## 2022-12-15 DIAGNOSIS — M25661 Stiffness of right knee, not elsewhere classified: Secondary | ICD-10-CM

## 2022-12-15 NOTE — Therapy (Signed)
OUTPATIENT PHYSICAL THERAPY LOWER EXTREMITY TREATMENT   Patient Name: Tammy Mills MRN: [id number] DOB:18-Sep-1955, 67 y.o., female Today's Date: 12/15/2022  END OF SESSION:  PT End of Session - 12/15/22 1406     Visit Number 6    Number of Visits 20    Date for PT Re-Evaluation 01/19/23    Authorization Type medicare and [employer]    Progress Note Due on Visit 10    PT Start Time 1400    PT Stop Time 1445    PT Time Calculation (min) 45 min    Activity Tolerance Patient tolerated treatment well             Past Medical History:  Diagnosis Date   Arthritis    GERD (gastroesophageal reflux disease)    Heart murmur    Mitral valve prolapse    mild to moderate regurgitation   Osteoporosis    Past Surgical History:  Procedure Laterality Date   BREAST EXCISIONAL BIOPSY Right    20 + yrs ago, benign   BREAST SURGERY     COLONOSCOPY     TOTAL KNEE ARTHROPLASTY Left 04/01/2022   Procedure: LEFT TOTAL KNEE ARTHROPLASTY;  Surgeon: Kathryne Hitch, MD;  Location: [facility];  Service: Orthopedics;  Laterality: Left;   TOTAL KNEE ARTHROPLASTY Right 11/25/2022   Procedure: RIGHT TOTAL KNEE ARTHROPLASTY;  Surgeon: Kathryne Hitch, MD;  Location: [facility];  Service: Orthopedics;  Laterality: Right;   WISDOM TOOTH EXTRACTION     Patient Active Problem List   Diagnosis Date Noted   Status post total right knee replacement 11/25/2022   Status post total left knee replacement 04/01/2022   Osteoporosis    Mitral valve prolapse    Osteopenia 05/07/2013   Routine general medical examination at a health care facility 04/30/2013   Right knee pain 05/08/2012   Right hip pain 05/08/2012   Knee pain, bilateral 06/03/2011   WEIGHT GAIN 06/28/2010    PCP: Dr Lynnea Ferrier  REFERRING PROVIDER: Dr Doneen Poisson  REFERRING DIAG: Rt TKA  THERAPY DIAG:  Chronic pain of right knee  Stiffness of right knee, not elsewhere classified  Muscle weakness  (generalized)  Difficulty in walking, not elsewhere classified  Localized edema  Rationale for Evaluation and Treatment: Rehabilitation  ONSET DATE: surgery 11/25/22  SUBJECTIVE:   SUBJECTIVE STATEMENT: Patient reports that the pain at tourniquet site is decreasing. Patient states she is primarily using SPC. Patient reports staples were removed and steri strips placed on incision this week.   PERTINENT HISTORY: Arthritis; heart murmur; Lt TKA 7/23 PAIN:  Are you having pain? Yes: NPRS scale: 2/10 Pain location: Rt knee  Pain description: aching Aggravating factors: moving knee; bending knee  Relieving factors: ice; meds  PRECAUTIONS: Post op Total Knee Arthroplasty   WEIGHT BEARING RESTRICTIONS: No  FALLS:  Has patient fallen in last 6 months? 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  No acute fracture. Valgus deformity has progressed since prior films.  Osteochondroma remains  unchanged patella notch area.  Moderate patellofemoral arthritic changes.    EDEMA:  Moderate edema Rt knee and thigh  MUSCLE LENGTH: Hamstrings: Right 65 deg; Left 70 deg Hip flexors: Right tight   POSTURE: rounded shoulders, forward head, and flexed trunk    LOWER EXTREMITY ROM: assessed in supine   Active ROM Right eval Right  12/06/22 Left eval Right 12/12/22  Hip flexion      Hip extension      Hip  abduction      Hip adduction      Hip internal rotation      Hip external rotation      Knee flexion 65 95 122 104  Knee extension -11 -6 0 -4  Ankle dorsiflexion      Ankle plantarflexion      Ankle inversion      Ankle eversion       (Blank rows = not tested)  LOWER EXTREMITY MMT:  MMT Right eval Left eval  Hip flexion NT 5  Hip extension NT 5  Hip abduction NT 5  Hip adduction NT 5  Hip internal rotation    Hip external rotation    Knee flexion NT 5  Knee extension NT 5  Ankle dorsiflexion    Ankle plantarflexion    Ankle inversion    Ankle eversion     (Blank rows = not tested)   FUNCTIONAL TESTS:    GAIT: Distance walked: 120 ft x 2 Assistive device utilized: SPC Level of assistance: SBA  Comments: step through gait pattern with improving weight shift and weight bearing Rt LE    [facility] Adult PT Treatment:                                                DATE: 12/15/2022 Therapeutic Exercise: Nustep L6 x 6 min U/LE's  Hamstring stretch sitting 30 sec x 3  Quad set 10x5" --> heel propped on foam roll x 10 SAQ 10 x 2 sets x5" SLR 10 x 2 sets x 5" AAROM heel slides w/strap 15x5" Supine knee to chest PT assist for knee flexion 10-20 sec hold x 3  Bridge small range 3' x 10  Bridge to knee flexion stretch 20 sec x 3 Sidelying Rt hip abduction leading with heel to strengthen glut med  Prone glut set 10 sec x 10  Knee extension prone toe resting on table 5 sec x 10   Manual:  Assisted knee extension with overpressure 10 sec x 3 heel propped on foam roll supine Assisted knee flexin ~ 20 sec hold x 3 pt prone    [facility] Adult PT Treatment:                                                DATE: 12/12/2022 Therapeutic Exercise: Recumbent bike partial revolutions x 5min AAROM heel slides w/strap 15x5" Quad set 10x5" --> heel propped on towel roll x5 SAQ 10x5" Seated knee flexion stretch 5x10" LAQ 4#AW 10x5" Side stepping (counter) YTB  Standing squats 2x10 Standing  hip ext x10 --> back diagonal (abd+ext) x10  [facility] Adult PT Treatment:                                                DATE: 12/07/2022 Therapeutic Exercise: Nustep L6 x 7 min Quad set 10 sec x 5 Quad set heel supported 5 sec x 5  SLR 5 sec x 5  SAQ 5 sec x 5 AAROM heel slide w/strap 10x10" Side steps at counter 10 feet x 10  Partial knee bend at counter 10 x 2  Hip extension in standing x 10 Rt/Lt  Hip abduction in standing leading with heel  Sit to stand feet even, minimal UE assist x 5  Knee flexion at 12 inch step leaning fwd to increase knee flexion 10 sec x 5 Tapping toe to 12 inch step alternating Lt/Rt x 10 each  Step up 6 in Rt up x 10  Gait:  Gait with standard cane level and carpeted surfaces x ~ 250 ft CGA to SBA step through gait Manual:   Assisted knee extension with overpressure 10 sec x 3     PATIENT EDUCATION:  Education details: Updated HEP  Person educated: Patient Education method: Explanation, Demonstration, Tactile cues, Verbal cues, and Handouts Education comprehension: verbalized understanding, returned demonstration, verbal cues required, tactile cues required, and needs further education  HOME EXERCISE PROGRAM: Access Code: [email or web address] URL: [email or web address] Date: 12/15/2022 Prepared by: Corlis Leak  Exercises - Supine Quad Set  - 2 x daily - 7 x weekly - 1 sets - 10 reps - 3 sec  hold - Small Range Straight Leg Raise  - 2 x daily - 7 x weekly - 1 sets - 10 reps - 5 sec  hold - Supine Hip Abduction  - 2 x daily - 7 x weekly - 1 sets - 10 reps - 3 sec  hold - Supine Heel Slide with Strap  - 2 x daily - 7 x weekly - 1 sets - 5-10 reps - 10 sec  hold - Seated Knee Flexion Stretch  - 1 x daily - 7 x weekly - 3 sets - 10 reps - Standing Hip Abduction with Counter Support  - 1 x daily - 7 x weekly - 3 sets - 10 reps - Standing Hip Extension with Counter Support  - 1 x daily - 7 x weekly - 3 sets - 10 reps - Staggered Stance Forward Backward  Weight Shift with Counter Support  - 1 x daily - 7 x weekly - 3 sets - 10 reps - Side Stepping with Counter Support  - 2 x daily - 7 x weekly - 1 sets - 3 reps - 30 sec  hold - Mini Squat with Counter Support  - 2 x daily - 7 x weekly - 1-2 sets - 10 reps - 3 sec  hold - Standing Knee Flexion Stretch on Step  - 2 x daily - 7 x weekly - 1 sets - 5-10 reps - 10-20 sec  hold - Standing Marching  - 2 x daily - 7 x weekly - 1-2 sets - 10 reps - Step Up  - 2 x daily - 7 x weekly - 1 sets - 10 reps - 2 sec  hold - Standing Hip Extension with Counter Support  - 2 x daily - 7 x weekly - 1-2 sets - 10 reps - 3-5 sec  hold -  Standing Hip Abduction with Counter Support  - 2 x daily - 7 x weekly - 2-3 sets - 10 reps - 2-3 sec  hold - Bridge  - 2 x daily - 7 x weekly - 1-2 sets - 10 reps - 5 sec  hold - Sidelying Hip Abduction  - 1 x daily - 7 x weekly - 3 sets - 10 reps - 3-5 sec  hold  ASSESSMENT:  CLINICAL IMPRESSION: Progressing well with R knee AROM and strengthening exercises.    OBJECTIVE IMPAIRMENTS: Abnormal gait, decreased activity tolerance, decreased mobility, difficulty walking, decreased ROM, decreased strength, hypomobility, increased edema, increased fascial restrictions, impaired flexibility, improper body mechanics, postural dysfunction, and pain.    GOALS: Goals reviewed with patient? Yes  SHORT TERM GOALS: Target date: 12/22/2022  Patient independent in initial HEP  Baseline: Goal status: INITIAL  2.  Patient independent in all transfers sit >/< stand </> supine  Baseline:  Goal status: INITIAL    LONG TERM GOALS: Target date: 01/19/2023  Increase AROM Rt knee to 0 degrees extension to 120 degrees flexion  Baseline:  Goal status: INITIAL  2.  4+/5 to 5/5 strength Rt LE Baseline:  Goal status: INITIAL  3.  Improved gait pattern with patient ambulatory for functional and community distances of 500-700 ft  Baseline:  Goal status: INITIAL  4.  Patient to report ability  to walk dog for 1 mile with appropriate plan to work toward walking 3 miles   Baseline:  Goal status: INITIAL  5.  Independent in all transfers including floor </> chair  Baseline:  Goal status: INITIAL  6.  Independent in HEP including aquatic exercise program as indicated  Baseline:  Goal status: INITIAL   PLAN:  PT FREQUENCY: 2-3x/week  PT DURATION: 8 weeks  PLANNED INTERVENTIONS: Therapeutic exercises, Therapeutic activity, Neuromuscular re-education, Balance training, Gait training, Patient/Family education, Self Care, Joint mobilization, Aquatic Therapy, Dry Needling, Electrical stimulation, Cryotherapy, Moist heat, scar mobilization, Taping, Ultrasound, Ionotophoresis 4mg /ml Dexamethasone, Manual therapy, and Re-evaluation  PLAN FOR NEXT SESSION: Progress exercises; gait training; manual work, DN, modalities as indicated   Jody Silas P. Leonor Liv PT, MPH 12/15/22 2:44 PM

## 2022-12-19 ENCOUNTER — Ambulatory Visit: Payer: Medicare Other

## 2022-12-19 DIAGNOSIS — R262 Difficulty in walking, not elsewhere classified: Secondary | ICD-10-CM

## 2022-12-19 DIAGNOSIS — M25662 Stiffness of left knee, not elsewhere classified: Secondary | ICD-10-CM

## 2022-12-19 DIAGNOSIS — M6281 Muscle weakness (generalized): Secondary | ICD-10-CM

## 2022-12-19 DIAGNOSIS — G8929 Other chronic pain: Secondary | ICD-10-CM

## 2022-12-19 DIAGNOSIS — M25561 Pain in right knee: Secondary | ICD-10-CM | POA: Diagnosis not present

## 2022-12-19 DIAGNOSIS — M25661 Stiffness of right knee, not elsewhere classified: Secondary | ICD-10-CM

## 2022-12-19 DIAGNOSIS — R600 Localized edema: Secondary | ICD-10-CM

## 2022-12-19 NOTE — Therapy (Signed)
OUTPATIENT PHYSICAL THERAPY LOWER EXTREMITY TREATMENT   Patient Name: Tammy Mills MRN: [id number] DOB:[birth year], 67 y.o., female Today's Date: 12/19/2022  END OF SESSION:  PT End of Session - 12/19/22 1403     Visit Number 7    Number of Visits 20    Date for PT Re-Evaluation 01/19/23    Authorization Type medicare and [employer]    Progress Note Due on Visit 10    PT Start Time 1400    PT Stop Time 1445    PT Time Calculation (min) 45 min    Activity Tolerance Patient tolerated treatment well    Behavior During Therapy WFL for tasks assessed/performed             Past Medical History:  Diagnosis Date   Arthritis    GERD (gastroesophageal reflux disease)    Heart murmur    Mitral valve prolapse    mild to moderate regurgitation   Osteoporosis    Past Surgical History:  Procedure Laterality Date   BREAST EXCISIONAL BIOPSY Right    20 + yrs ago, benign   BREAST SURGERY     COLONOSCOPY     TOTAL KNEE ARTHROPLASTY Left 04/01/2022   Procedure: LEFT TOTAL KNEE ARTHROPLASTY;  Surgeon: Kathryne Hitch, MD;  Location: [facility];  Service: Orthopedics;  Laterality: Left;   TOTAL KNEE ARTHROPLASTY Right 11/25/2022   Procedure: RIGHT TOTAL KNEE ARTHROPLASTY;  Surgeon: Kathryne Hitch, MD;  Location: [facility];  Service: Orthopedics;  Laterality: Right;   WISDOM TOOTH EXTRACTION     Patient Active Problem List   Diagnosis Date Noted   Status post total right knee replacement 11/25/2022   Status post total left knee replacement 04/01/2022   Osteoporosis    Mitral valve prolapse    Osteopenia 05/07/2013   Routine general medical examination at a health care facility 04/30/2013   Right knee pain 05/08/2012   Right hip pain 05/08/2012   Knee pain, bilateral 06/03/2011   WEIGHT GAIN 06/28/2010    PCP: Dr Lynnea Ferrier  REFERRING PROVIDER: Dr Doneen Poisson  REFERRING DIAG: Rt TKA  THERAPY DIAG:  Chronic pain of right knee  Stiffness of right knee, not  elsewhere classified  Muscle weakness (generalized)  Difficulty in walking, not elsewhere classified  Localized edema  Chronic pain of left knee  Stiffness of left knee, not elsewhere classified  Rationale for Evaluation and Treatment: Rehabilitation  ONSET DATE: surgery 11/25/22  SUBJECTIVE:   SUBJECTIVE STATEMENT: Patient reports 2/10 pain in knee today, states soreness at tourniquet site is lessening. Patient states she tried she was able to get down/up from floor for exercises but relied on her arms a lot for support.   PERTINENT HISTORY: Arthritis; heart murmur; Lt TKA 7/23 PAIN:  Are you having pain? Yes: NPRS scale: 2/10 Pain location: Rt knee  Pain description: aching Aggravating factors: moving knee; bending knee  Relieving factors: ice; meds  PRECAUTIONS: Post op Total Knee Arthroplasty   WEIGHT BEARING RESTRICTIONS: No  FALLS:  Has patient fallen in last 6 months? 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  No acute fracture. Valgus deformity has progressed since prior films.  Osteochondroma remains  unchanged patella notch area.  Moderate patellofemoral arthritic changes.    EDEMA:  Moderate edema Rt knee and thigh  MUSCLE LENGTH: Hamstrings: Right 65 deg; Left 70 deg Hip flexors: Right tight   POSTURE: rounded shoulders, forward head, and flexed trunk    LOWER EXTREMITY  ROM: assessed in supine   Active ROM Right eval Right  12/06/22 Left eval Right 12/12/22  Hip flexion      Hip extension      Hip abduction      Hip adduction      Hip internal rotation      Hip external rotation      Knee flexion 65 95 122 104  Knee extension -11 -6 0 -4  Ankle dorsiflexion      Ankle plantarflexion      Ankle inversion      Ankle eversion       (Blank rows = not tested)  LOWER EXTREMITY MMT:  MMT Right eval Left eval  Hip flexion NT 5  Hip extension NT 5  Hip abduction NT 5  Hip adduction NT 5  Hip internal rotation    Hip external rotation    Knee flexion NT 5  Knee extension NT 5  Ankle dorsiflexion    Ankle plantarflexion    Ankle inversion    Ankle eversion     (Blank rows = not tested)   FUNCTIONAL TESTS:    GAIT: Distance walked: 120 ft x 2 Assistive device utilized: SPC Level of assistance: SBA  Comments: step through gait pattern with improving weight shift and weight bearing Rt LE   [facility] Adult PT Treatment:                                                DATE: 10/20/2022 Therapeutic Exercise: NuStep L7 x 5 min Mat Table: HS stretch w/strap 2x30" B AAROM heel slides w/strap 10x10" LAQ 5#AW 2x10 B Supine bridges + ball squeeze x10 Therapeutic Activity: Stair navigation 2HHA --> 1 HHA reciprocal stepping (therapy step) Stair navigation 1HHA (long staircase at end of hall)    [facility] Adult PT Treatment:                                                DATE: 12/15/2022 Therapeutic Exercise: Nustep L6 x 6 min U/LE's  Hamstring stretch sitting 30 sec x 3  Quad set 10x5" --> heel propped on foam roll x 10 SAQ 10 x 2 sets x5" SLR 10 x 2 sets x 5" AAROM heel slides w/strap 15x5" Supine knee to chest PT assist for knee flexion 10-20 sec hold x 3  Bridge small range 3' x 10  Bridge to knee flexion stretch 20 sec x 3 Sidelying Rt hip abduction leading with heel to strengthen glut med  Prone glut set 10 sec x 10  Knee extension prone toe  resting on table 5 sec x 10  Manual:  Assisted knee extension with overpressure 10 sec x 3 heel propped on foam roll supine Assisted knee flexin ~ 20 sec hold x 3 pt prone    [facility] Adult PT Treatment:                                                DATE: 12/12/2022 Therapeutic Exercise: Recumbent bike partial revolutions x AAROM heel slides w/strap 15x5" Quad set 10x5" --> heel propped on towel roll x5 SAQ 10x5" Seated knee flexion stretch 5x10" LAQ 4#AW 10x5" Side stepping (counter) YTB  Standing squats 2x10 Standing hip ext x10 --> back diagonal (abd+ext) x10   PATIENT EDUCATION:  Education details: IT consultant with 1 HR Person educated: Patient Education method: Programmer, multimedia, Facilities manager, Actor cues, Verbal cues, and Handouts Education comprehension: verbalized understanding, returned demonstration, verbal cues required, tactile cues required, and needs further education  HOME EXERCISE PROGRAM: Access Code: [email or web address] URL: [email or web address] Date: 12/15/2022 Prepared by: Corlis Leak  Exercises - Supine Quad Set  - 2 x daily - 7 x weekly - 1 sets - 10 reps - 3 sec  hold - Small Range Straight Leg Raise  - 2 x daily - 7 x weekly - 1 sets - 10 reps - 5 sec  hold - Supine Hip Abduction  - 2 x daily - 7 x weekly - 1 sets - 10 reps - 3 sec  hold - Supine Heel Slide with Strap  - 2 x daily - 7 x weekly - 1 sets - 5-10 reps - 10 sec  hold - Seated Knee Flexion Stretch  - 1 x daily - 7 x weekly - 3 sets - 10 reps - Standing Hip Abduction with Counter Support  - 1 x daily - 7 x weekly - 3 sets - 10 reps - Standing Hip Extension with Counter Support  - 1 x daily - 7 x weekly - 3 sets - 10 reps - Staggered Stance Forward Backward Weight Shift with Counter Support  - 1 x daily - 7 x weekly - 3 sets - 10 reps - Side Stepping with Counter Support  - 2 x daily - 7 x weekly - 1 sets - 3 reps - 30 sec  hold - Mini Squat with Counter Support  - 2 x daily - 7 x weekly  - 1-2 sets - 10 reps - 3 sec  hold - Standing Knee Flexion Stretch on Step  - 2 x daily - 7 x weekly - 1 sets - 5-10 reps - 10-20 sec  hold - Standing Marching  - 2 x daily - 7 x weekly - 1-2 sets - 10 reps - Step Up  - 2 x daily - 7 x weekly - 1 sets - 10 reps - 2 sec  hold - Standing Hip Extension with Counter Support  - 2 x daily - 7 x weekly - 1-2 sets - 10 reps - 3-5 sec  hold - Standing Hip Abduction with Counter Support  - 2 x daily - 7 x weekly - 2-3 sets - 10 reps - 2-3 sec  hold - Bridge  - 2 x daily - 7 x weekly - 1-2 sets - 10 reps - 5 sec  hold - Sidelying Hip Abduction  - 1 x daily - 7 x weekly - 3 sets - 10 reps - 3-5 sec  hold  ASSESSMENT:  CLINICAL IMPRESSION: Stair navigation progressed to longer staircase with one hand railing. Patient able to progress to reciprocal stepping pattern with one hand hold assist. Adding hip add isometric (ball squeeze) during supine bridges improved ROM and core stability.   OBJECTIVE IMPAIRMENTS: Abnormal gait, decreased activity tolerance, decreased mobility, difficulty walking, decreased ROM, decreased strength, hypomobility, increased edema, increased fascial restrictions, impaired flexibility, improper body mechanics, postural dysfunction, and pain.    GOALS: Goals reviewed with patient? Yes  SHORT TERM GOALS: Target date: 12/22/2022  Patient independent in initial HEP  Baseline: Goal status: INITIAL  2.  Patient independent in all transfers sit >/< stand </> supine  Baseline:  Goal status: INITIAL   LONG TERM GOALS: Target date: 01/19/2023  Increase AROM Rt knee to 0 degrees extension to 120 degrees flexion  Baseline:  Goal status: INITIAL  2.  4+/5 to 5/5 strength Rt LE Baseline:  Goal status: INITIAL  3.  Improved gait pattern with patient ambulatory for functional and community distances of 500-700 ft  Baseline:  Goal status: INITIAL  4.  Patient to report ability to walk dog for 1 mile with appropriate plan to work  toward walking 3 miles   Baseline:  Goal status: INITIAL  5.  Independent in all transfers including floor </> chair  Baseline:  Goal status: INITIAL  6.  Independent in HEP including aquatic exercise program as indicated  Baseline:  Goal status: INITIAL   PLAN:  PT FREQUENCY: 2-3x/week  PT DURATION: 8 weeks  PLANNED INTERVENTIONS: Therapeutic exercises, Therapeutic activity, Neuromuscular re-education, Balance training, Gait training, Patient/Family education, Self Care, Joint mobilization, Aquatic Therapy, Dry Needling, Electrical stimulation, Cryotherapy, Moist heat, scar mobilization, Taping, Ultrasound, Ionotophoresis 4mg /ml Dexamethasone, Manual therapy, and Re-evaluation  PLAN FOR NEXT SESSION: Progress exercises; gait training; manual work, DN, modalities as indicated   Carlynn Herald, PTA 12/19/2022 2:05PM

## 2022-12-22 ENCOUNTER — Ambulatory Visit: Payer: Medicare Other

## 2022-12-22 DIAGNOSIS — R262 Difficulty in walking, not elsewhere classified: Secondary | ICD-10-CM

## 2022-12-22 DIAGNOSIS — M6281 Muscle weakness (generalized): Secondary | ICD-10-CM

## 2022-12-22 DIAGNOSIS — M25561 Pain in right knee: Secondary | ICD-10-CM | POA: Diagnosis not present

## 2022-12-22 DIAGNOSIS — M25661 Stiffness of right knee, not elsewhere classified: Secondary | ICD-10-CM

## 2022-12-22 DIAGNOSIS — R600 Localized edema: Secondary | ICD-10-CM

## 2022-12-22 DIAGNOSIS — G8929 Other chronic pain: Secondary | ICD-10-CM

## 2022-12-22 NOTE — Therapy (Signed)
OUTPATIENT PHYSICAL THERAPY LOWER EXTREMITY TREATMENT   Patient Name: Tammy Mills MRN: [id number] DOB:May 10, 1956, 67 y.o., female Today's Date: 12/22/2022  END OF SESSION:  PT End of Session - 12/22/22 1405     Visit Number 8    Number of Visits 20    Date for PT Re-Evaluation 01/19/23    Authorization Type medicare and [employer]    Progress Note Due on Visit 10    PT Start Time 1402    PT Stop Time 1442    PT Time Calculation (min) 40 min    Activity Tolerance Patient tolerated treatment well    Behavior During Therapy WFL for tasks assessed/performed             Past Medical History:  Diagnosis Date   Arthritis    GERD (gastroesophageal reflux disease)    Heart murmur    Mitral valve prolapse    mild to moderate regurgitation   Osteoporosis    Past Surgical History:  Procedure Laterality Date   BREAST EXCISIONAL BIOPSY Right    20 + yrs ago, benign   BREAST SURGERY     COLONOSCOPY     TOTAL KNEE ARTHROPLASTY Left 04/01/2022   Procedure: LEFT TOTAL KNEE ARTHROPLASTY;  Surgeon: Kathryne Hitch, MD;  Location: [facility];  Service: Orthopedics;  Laterality: Left;   TOTAL KNEE ARTHROPLASTY Right 11/25/2022   Procedure: RIGHT TOTAL KNEE ARTHROPLASTY;  Surgeon: Kathryne Hitch, MD;  Location: [facility];  Service: Orthopedics;  Laterality: Right;   WISDOM TOOTH EXTRACTION     Patient Active Problem List   Diagnosis Date Noted   Status post total right knee replacement 11/25/2022   Status post total left knee replacement 04/01/2022   Osteoporosis    Mitral valve prolapse    Osteopenia 05/07/2013   Routine general medical examination at a health care facility 04/30/2013   Right knee pain 05/08/2012   Right hip pain 05/08/2012   Knee pain, bilateral 06/03/2011   WEIGHT GAIN 06/28/2010    PCP: Dr Lynnea Ferrier  REFERRING PROVIDER: Dr Doneen Poisson  REFERRING DIAG: Rt TKA  THERAPY DIAG:  Chronic pain of right knee  Stiffness of right knee, not  elsewhere classified  Muscle weakness (generalized)  Difficulty in walking, not elsewhere classified  Localized edema  Rationale for Evaluation and Treatment: Rehabilitation  ONSET DATE: surgery 11/25/22  SUBJECTIVE:   SUBJECTIVE STATEMENT: Patient reports 1/10 pain in knee today, states she has been working on navigating stairs at home with 1 HHA on railing.   PERTINENT HISTORY: Arthritis; heart murmur; Lt TKA 7/23 PAIN:  Are you having pain? Yes: NPRS scale: 2/10 Pain location: Rt knee  Pain description: aching Aggravating factors: moving knee; bending knee  Relieving factors: ice; meds  PRECAUTIONS: Post op Total Knee Arthroplasty   WEIGHT BEARING RESTRICTIONS: No  FALLS:  Has patient fallen in last 6 months? 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  No acute fracture. Valgus deformity has progressed since prior films.  Osteochondroma remains  unchanged patella notch area.  Moderate patellofemoral arthritic changes.    EDEMA:  Moderate edema Rt knee and thigh  MUSCLE LENGTH: Hamstrings: Right 65 deg; Left 70 deg Hip flexors: Right tight   POSTURE: rounded shoulders, forward head, and flexed trunk    LOWER EXTREMITY ROM: assessed in supine   Active ROM Right eval Right  12/06/22 Left eval Right 12/12/22  Hip flexion      Hip extension      Hip abduction      Hip adduction       Hip internal rotation      Hip external rotation      Knee flexion 65 95 122 104  Knee extension -11 -6 0 -4  Ankle dorsiflexion      Ankle plantarflexion      Ankle inversion      Ankle eversion       (Blank rows = not tested)  LOWER EXTREMITY MMT:  MMT Right eval Left eval  Hip flexion NT 5  Hip extension NT 5  Hip abduction NT 5  Hip adduction NT 5  Hip internal rotation    Hip external rotation    Knee flexion NT 5  Knee extension NT 5  Ankle dorsiflexion    Ankle plantarflexion    Ankle inversion    Ankle eversion     (Blank rows = not tested)   FUNCTIONAL TESTS:    GAIT: Distance walked: 120 ft x 2 Assistive device utilized: SPC Level of assistance: SBA  Comments: step through gait pattern with improving weight shift and weight bearing Rt LE   [facility] Adult PT Treatment:                                                DATE: 12/22/2022 Therapeutic Exercise: NuStep L6 x 5 min Mat Table: HS x 30" each R ITB stretches w/strap x30"  AAROM heel slides w/strap 10x10" Side Lying: clamshells YTB x10 straight leg hip abd YTB x10 Hip abd + ext leg raises x10 Bridges + ball squeeze 2x10 Step down leading with L LE (6" step side)   [facility] Adult PT Treatment:                                                DATE: 10/20/2022 Therapeutic Exercise: NuStep L7 x 5 min Mat Table: HS stretch w/strap 2x30" B AAROM heel slides w/strap 10x10" LAQ 5#AW 2x10 B Supine bridges + ball squeeze x10 Therapeutic Activity: Stair navigation 2HHA --> 1 HHA reciprocal stepping (therapy step) Stair navigation 1HHA (long staircase at end of hall)   PATIENT EDUCATION:  Education details: Walking program Person educated: Patient Education method: Programmer, multimedia, Facilities manager, Actor cues, Verbal cues, and Handouts Education comprehension: verbalized understanding, returned demonstration, verbal cues required, tactile cues required, and needs further education  HOME EXERCISE  PROGRAM: Access Code: [email or web address] URL: [email or web address] Date: 12/15/2022 Prepared by: Corlis Leak  Exercises - Supine Quad Set  - 2 x daily - 7 x weekly - 1 sets - 10 reps - 3 sec  hold - Small Range Straight  Leg Raise  - 2 x daily - 7 x weekly - 1 sets - 10 reps - 5 sec  hold - Supine Hip Abduction  - 2 x daily - 7 x weekly - 1 sets - 10 reps - 3 sec  hold - Supine Heel Slide with Strap  - 2 x daily - 7 x weekly - 1 sets - 5-10 reps - 10 sec  hold - Seated Knee Flexion Stretch  - 1 x daily - 7 x weekly - 3 sets - 10 reps - Standing Hip Abduction with Counter Support  - 1 x daily - 7 x weekly - 3 sets - 10 reps - Standing Hip Extension with Counter Support  - 1 x daily - 7 x weekly - 3 sets - 10 reps - Staggered Stance Forward Backward Weight Shift with Counter Support  - 1 x daily - 7 x weekly - 3 sets - 10 reps - Side Stepping with Counter Support  - 2 x daily - 7 x weekly - 1 sets - 3 reps - 30 sec  hold - Mini Squat with Counter Support  - 2 x daily - 7 x weekly - 1-2 sets - 10 reps - 3 sec  hold - Standing Knee Flexion Stretch on Step  - 2 x daily - 7 x weekly - 1 sets - 5-10 reps - 10-20 sec  hold - Standing Marching  - 2 x daily - 7 x weekly - 1-2 sets - 10 reps - Step Up  - 2 x daily - 7 x weekly - 1 sets - 10 reps - 2 sec  hold - Standing Hip Extension with Counter Support  - 2 x daily - 7 x weekly - 1-2 sets - 10 reps - 3-5 sec  hold - Standing Hip Abduction with Counter Support  - 2 x daily - 7 x weekly - 2-3 sets - 10 reps - 2-3 sec  hold - Bridge  - 2 x daily - 7 x weekly - 1-2 sets - 10 reps - 5 sec  hold - Sidelying Hip Abduction  - 1 x daily - 7 x weekly - 3 sets - 10 reps - 3-5 sec  hold  ASSESSMENT:  CLINICAL IMPRESSION: Stair navigation performed with focus on step down with unaffected leg to increase weight bearing and knee flexion AROM on L LE. Discussion with patient on starting walking program to progress tolerance and endurance with prolonged walking  to return to walking her dog and participating in 5Ks.    OBJECTIVE IMPAIRMENTS: Abnormal gait, decreased activity tolerance, decreased mobility, difficulty walking, decreased ROM, decreased strength, hypomobility, increased edema, increased fascial restrictions, impaired flexibility, improper body mechanics, postural dysfunction, and pain.    GOALS: Goals reviewed with patient? Yes  SHORT TERM GOALS: Target date: 12/22/2022  Patient independent in initial HEP  Baseline: Goal status: MET  2.  Patient independent in all transfers sit >/< stand </> supine  Baseline:  Goal status: MET   LONG TERM GOALS: Target date: 01/19/2023  Increase AROM Rt knee to 0 degrees extension to 120 degrees flexion  Baseline:  Goal status: INITIAL  2.  4+/5 to 5/5 strength Rt LE Baseline:  Goal status: INITIAL  3.  Improved gait pattern with patient ambulatory for functional and community distances of 500-700 ft  Baseline:  Goal status: INITIAL  4.  Patient to report ability to walk dog for 1 mile with appropriate plan to work toward walking 3 miles  Baseline:  Goal status: INITIAL  5.  Independent in all transfers including floor </> chair  Baseline:  Goal status: INITIAL  6.  Independent in HEP including aquatic exercise program as indicated  Baseline:  Goal status: INITIAL   PLAN:  PT FREQUENCY: 2-3x/week  PT DURATION: 8 weeks  PLANNED INTERVENTIONS: Therapeutic exercises, Therapeutic activity, Neuromuscular re-education, Balance training, Gait training, Patient/Family education, Self Care, Joint mobilization, Aquatic Therapy, Dry Needling, Electrical stimulation, Cryotherapy, Moist heat, scar mobilization, Taping, Ultrasound, Ionotophoresis 4mg /ml Dexamethasone, Manual therapy, and Re-evaluation  PLAN FOR NEXT SESSION: Progress exercises; gait training; manual work, DN, modalities as indicated   Carlynn Herald, PTA 12/22/2022 2:05PM

## 2022-12-26 ENCOUNTER — Ambulatory Visit: Payer: Medicare Other

## 2022-12-26 DIAGNOSIS — R262 Difficulty in walking, not elsewhere classified: Secondary | ICD-10-CM

## 2022-12-26 DIAGNOSIS — M25561 Pain in right knee: Secondary | ICD-10-CM | POA: Diagnosis not present

## 2022-12-26 DIAGNOSIS — R600 Localized edema: Secondary | ICD-10-CM

## 2022-12-26 DIAGNOSIS — M25662 Stiffness of left knee, not elsewhere classified: Secondary | ICD-10-CM

## 2022-12-26 DIAGNOSIS — G8929 Other chronic pain: Secondary | ICD-10-CM

## 2022-12-26 DIAGNOSIS — M6281 Muscle weakness (generalized): Secondary | ICD-10-CM

## 2022-12-26 DIAGNOSIS — M25661 Stiffness of right knee, not elsewhere classified: Secondary | ICD-10-CM

## 2022-12-26 NOTE — Therapy (Signed)
OUTPATIENT PHYSICAL THERAPY LOWER EXTREMITY TREATMENT   Patient Name: Tammy Mills MRN: [id number] DOB:1955/11/28, 67 y.o., female Today's Date: 12/26/2022  END OF SESSION:  PT End of Session - 12/26/22 1410     Visit Number 9    Number of Visits 20    Date for PT Re-Evaluation 01/19/23    Authorization Type medicare and [employer]    Progress Note Due on Visit 10    PT Start Time 1400    PT Stop Time 1444    PT Time Calculation (min) 44 min    Activity Tolerance Patient tolerated treatment well    Behavior During Therapy WFL for tasks assessed/performed             Past Medical History:  Diagnosis Date   Arthritis    GERD (gastroesophageal reflux disease)    Heart murmur    Mitral valve prolapse    mild to moderate regurgitation   Osteoporosis    Past Surgical History:  Procedure Laterality Date   BREAST EXCISIONAL BIOPSY Right    20 + yrs ago, benign   BREAST SURGERY     COLONOSCOPY     TOTAL KNEE ARTHROPLASTY Left 04/01/2022   Procedure: LEFT TOTAL KNEE ARTHROPLASTY;  Surgeon: Kathryne Hitch, MD;  Location: [facility];  Service: Orthopedics;  Laterality: Left;   TOTAL KNEE ARTHROPLASTY Right 11/25/2022   Procedure: RIGHT TOTAL KNEE ARTHROPLASTY;  Surgeon: Kathryne Hitch, MD;  Location: [facility];  Service: Orthopedics;  Laterality: Right;   WISDOM TOOTH EXTRACTION     Patient Active Problem List   Diagnosis Date Noted   Status post total right knee replacement 11/25/2022   Status post total left knee replacement 04/01/2022   Osteoporosis    Mitral valve prolapse    Osteopenia 05/07/2013   Routine general medical examination at a health care facility 04/30/2013   Right knee pain 05/08/2012   Right hip pain 05/08/2012   Knee pain, bilateral 06/03/2011   WEIGHT GAIN 06/28/2010    PCP: Dr Lynnea Ferrier  REFERRING PROVIDER: Dr Doneen Poisson  REFERRING DIAG: Rt TKA  THERAPY DIAG:  Chronic pain of right knee  Stiffness of right knee, not  elsewhere classified  Muscle weakness (generalized)  Difficulty in walking, not elsewhere classified  Localized edema  Chronic pain of left knee  Stiffness of left knee, not elsewhere classified  Rationale for Evaluation and Treatment: Rehabilitation  ONSET DATE: surgery 11/25/22  SUBJECTIVE:   SUBJECTIVE STATEMENT: Patient reports minimal pain in knee today; states she has been walking more outside and feels her endurance is improving.   PERTINENT HISTORY: Arthritis; heart murmur; Lt TKA 7/23 PAIN:  Are you having pain? Yes: NPRS scale: 2/10 Pain location: Rt knee  Pain description: aching Aggravating factors: moving knee; bending knee  Relieving factors: ice; meds  PRECAUTIONS: Post op Total Knee Arthroplasty   WEIGHT BEARING RESTRICTIONS: No  FALLS:  Has patient fallen in last 6 months? 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  No acute fracture. Valgus deformity has progressed since prior films.  Osteochondroma remains  unchanged patella notch area.  Moderate patellofemoral arthritic changes.    EDEMA:  Moderate edema Rt knee and thigh  MUSCLE LENGTH: Hamstrings: Right 65 deg; Left 70 deg Hip flexors: Right tight   POSTURE: rounded shoulders, forward head, and flexed trunk    LOWER EXTREMITY ROM: assessed in supine   Active ROM Right eval Right  12/06/22 Left eval  Right 12/12/22 Right 12/26/22  Hip flexion       Hip extension       Hip abduction       Hip adduction       Hip internal rotation       Hip external rotation       Knee flexion 65 95 122 104 110  Knee extension -11 -6 0 -4   Ankle dorsiflexion       Ankle plantarflexion       Ankle inversion       Ankle eversion        (Blank rows = not tested)  LOWER EXTREMITY MMT:  MMT Right eval Left eval Right 12/26/22  Hip flexion NT 5 4+  Hip extension NT 5 4-  Hip abduction NT 5 4  Hip adduction NT 5 4  Hip internal rotation     Hip external rotation     Knee flexion NT 5 4+  Knee extension NT 5 4+  Ankle dorsiflexion     Ankle plantarflexion     Ankle inversion     Ankle eversion      (Blank rows = not tested)   FUNCTIONAL TESTS:    GAIT: Distance walked: 120 ft x 2 Assistive device utilized: SPC Level of assistance: SBA  Comments: step through gait pattern with improving weight shift and weight bearing Rt LE   [facility] Adult PT Treatment:                                                DATE: 12/26/2022 Therapeutic Exercise: NuStep L 5-7 x 10 min + subjective intake Treadmill: 1.2 mph, 0% incline x 5 min AAROM heel slides (see above for knee flexion) 10x10"  R HS/ITB stretches 3x30" each  Seated knee extension GTB 10x5" (R) Seated knee flexion GTB x10 (R) Lateral heel taps (R SLS) x10 Lateral lunges with slider (R SLS) x10    [facility] Adult PT Treatment:                                                DATE: 12/22/2022 Therapeutic Exercise: NuStep L6 x 5 min Mat Table: HS x 30" each R ITB stretches w/strap x30"  AAROM heel slides w/strap 10x10" Side Lying: clamshells YTB x10 straight leg hip abd YTB x10 Hip abd + ext leg raises x10 Bridges + ball squeeze 2x10 Step down leading with L LE (6" step side)   PATIENT EDUCATION:  Education details: Walking program --> treadmill use on bad weather days Person educated: Patient Education method: Explanation, Demonstration,  Tactile cues, Verbal cues, and Handouts Education comprehension: verbalized understanding,  returned demonstration, verbal cues required, tactile cues required, and needs further education  HOME EXERCISE PROGRAM: Access Code: [email or web address] URL: [email or web address] Date: 12/15/2022 Prepared by: Corlis Leak  Exercises - Supine Quad Set  - 2 x daily - 7 x weekly - 1 sets - 10 reps - 3 sec  hold - Small Range Straight Leg Raise  - 2 x daily - 7 x weekly - 1 sets - 10 reps - 5 sec  hold - Supine Hip Abduction  - 2 x daily - 7 x weekly - 1 sets - 10 reps - 3 sec  hold - Supine Heel Slide with Strap  - 2 x daily - 7 x weekly - 1 sets - 5-10 reps - 10 sec  hold - Seated Knee Flexion Stretch  - 1 x daily - 7 x weekly - 3 sets - 10 reps - Standing Hip Abduction with Counter Support  - 1 x daily - 7 x weekly - 3 sets - 10 reps - Standing Hip Extension with Counter Support  - 1 x daily - 7 x weekly - 3 sets - 10 reps - Staggered Stance Forward Backward Weight Shift with Counter Support  - 1 x daily - 7 x weekly - 3 sets - 10 reps - Side Stepping with Counter Support  - 2 x daily - 7 x weekly - 1 sets - 3 reps - 30 sec  hold - Mini Squat with Counter Support  - 2 x daily - 7 x weekly - 1-2 sets - 10 reps - 3 sec  hold - Standing Knee Flexion Stretch on Step  - 2 x daily - 7 x weekly - 1 sets - 5-10 reps - 10-20 sec  hold - Standing Marching  - 2 x daily - 7 x weekly - 1-2 sets - 10 reps - Step Up  - 2 x daily - 7 x weekly - 1 sets - 10 reps - 2 sec  hold - Standing Hip Extension with Counter Support  - 2 x daily - 7 x weekly - 1-2 sets - 10 reps - 3-5 sec  hold - Standing Hip Abduction with Counter Support  - 2 x daily - 7 x weekly - 2-3 sets - 10 reps - 2-3 sec  hold - Bridge  - 2 x daily - 7 x weekly - 1-2 sets - 10 reps - 5 sec  hold - Sidelying Hip Abduction  - 1 x daily - 7 x weekly - 3 sets - 10 reps - 3-5 sec  hold  ASSESSMENT:  CLINICAL IMPRESSION: Tactile cues provided to improve  body mechanics and postural alignment during lateral heel taps and stairs and side lunges with slider. Patient able to complete all exercises with no exacerbation of pain.    OBJECTIVE IMPAIRMENTS: Abnormal gait, decreased activity tolerance, decreased mobility, difficulty walking, decreased ROM, decreased strength, hypomobility, increased edema, increased fascial restrictions, impaired flexibility, improper body mechanics, postural dysfunction, and pain.    GOALS: Goals reviewed with patient? Yes  SHORT TERM GOALS: Target date: 12/22/2022  Patient independent in initial HEP  Baseline: Goal status: MET  2.  Patient independent in all transfers sit >/< stand </> supine  Baseline:  Goal status: MET   LONG TERM GOALS: Target date: 01/19/2023  Increase AROM Rt knee to 0 degrees extension to 120 degrees flexion  Baseline: see above Goal status: IN PROGRESS  2.  4+/5 to 5/5 strength Rt LE Baseline:  Goal status: INITIAL  3.  Improved gait pattern with patient ambulatory for functional and community distances of 500-700 ft  Baseline:  Goal status: INITIAL  4.  Patient to report ability to walk dog for 1 mile with appropriate plan to work toward walking 3 miles   Baseline:  Goal status: INITIAL  5.  Independent in all transfers including floor </> chair  Baseline:  Goal status: INITIAL  6.  Independent in HEP including aquatic exercise program as indicated  Baseline:  Goal status: INITIAL   PLAN:  PT FREQUENCY: 2-3x/week  PT DURATION: 8 weeks  PLANNED INTERVENTIONS: Therapeutic exercises, Therapeutic activity, Neuromuscular re-education, Balance training, Gait training, Patient/Family education, Self Care, Joint mobilization, Aquatic Therapy, Dry Needling, Electrical stimulation, Cryotherapy, Moist heat, scar mobilization, Taping, Ultrasound, Ionotophoresis 4mg /ml Dexamethasone, Manual therapy, and Re-evaluation  PLAN FOR NEXT SESSION: Progress exercises; gait training;  manual work, DN, modalities as indicated   Carlynn Herald, PTA 12/26/2022 2:05PM

## 2022-12-29 ENCOUNTER — Ambulatory Visit: Payer: Medicare Other | Admitting: Rehabilitative and Restorative Service Providers"

## 2022-12-29 ENCOUNTER — Encounter: Payer: Self-pay | Admitting: Rehabilitative and Restorative Service Providers"

## 2022-12-29 DIAGNOSIS — R600 Localized edema: Secondary | ICD-10-CM

## 2022-12-29 DIAGNOSIS — M6281 Muscle weakness (generalized): Secondary | ICD-10-CM

## 2022-12-29 DIAGNOSIS — M25661 Stiffness of right knee, not elsewhere classified: Secondary | ICD-10-CM

## 2022-12-29 DIAGNOSIS — G8929 Other chronic pain: Secondary | ICD-10-CM

## 2022-12-29 DIAGNOSIS — M25561 Pain in right knee: Secondary | ICD-10-CM | POA: Diagnosis not present

## 2022-12-29 DIAGNOSIS — R262 Difficulty in walking, not elsewhere classified: Secondary | ICD-10-CM

## 2022-12-29 NOTE — Therapy (Addendum)
OUTPATIENT PHYSICAL THERAPY LOWER EXTREMITY TREATMENT AND  MEDICARE 10th VIAIT NOTE  Progress Note Reporting Period 11/28/22 to 12/29/22  See note below for Objective Data and Assessment of Progress/Goals.     Patient Name: Tammy Mills MRN: [id number] DOB:06-09-1956, 67 y.o., female Today's Date: 12/29/2022  END OF SESSION:  PT End of Session - 12/29/22 1411     Visit Number 10    Number of Visits 20    Date for PT Re-Evaluation 01/19/23    Authorization Type medicare and [employer]    Progress Note Due on Visit 10    PT Start Time 1400    PT Stop Time 1445    PT Time Calculation (min) 45 min    Activity Tolerance Patient tolerated treatment well             Past Medical History:  Diagnosis Date   Arthritis    GERD (gastroesophageal reflux disease)    Heart murmur    Mitral valve prolapse    mild to moderate regurgitation   Osteoporosis    Past Surgical History:  Procedure Laterality Date   BREAST EXCISIONAL BIOPSY Right    20 + yrs ago, benign   BREAST SURGERY     COLONOSCOPY     TOTAL KNEE ARTHROPLASTY Left 04/01/2022   Procedure: LEFT TOTAL KNEE ARTHROPLASTY;  Surgeon: Kathryne Hitch, MD;  Location: [facility];  Service: Orthopedics;  Laterality: Left;   TOTAL KNEE ARTHROPLASTY Right 11/25/2022   Procedure: RIGHT TOTAL KNEE ARTHROPLASTY;  Surgeon: Kathryne Hitch, MD;  Location: [facility];  Service: Orthopedics;  Laterality: Right;   WISDOM TOOTH EXTRACTION     Patient Active Problem List   Diagnosis Date Noted   Status post total right knee replacement 11/25/2022   Status post total left knee replacement 04/01/2022   Osteoporosis    Mitral valve prolapse    Osteopenia 05/07/2013   Routine general medical examination at a health care facility 04/30/2013   Right knee pain 05/08/2012   Right hip pain 05/08/2012   Knee pain, bilateral 06/03/2011   WEIGHT GAIN 06/28/2010    PCP: Dr Lynnea Ferrier  REFERRING PROVIDER: Dr Doneen Poisson  REFERRING DIAG: Rt TKA  THERAPY DIAG:  Chronic pain of right knee  Stiffness of right knee, not elsewhere classified  Muscle weakness (generalized)  Difficulty in walking, not elsewhere classified  Localized edema  Rationale for Evaluation and Treatment: Rehabilitation  ONSET DATE: surgery 11/25/22  SUBJECTIVE:   SUBJECTIVE STATEMENT: Patient reports minimal pain in knee; states she has been walking more outside and feels her endurance is improving. She is walking some in the kitchen without   PERTINENT HISTORY: Arthritis; heart murmur; Lt TKA 7/23 PAIN:  Are you having pain? Yes: NPRS scale: 0/10 Pain location: Rt knee  Pain description: aching Aggravating factors: moving knee; bending knee  Relieving factors: ice; meds  PRECAUTIONS: Post op Total Knee Arthroplasty   WEIGHT BEARING RESTRICTIONS: No  FALLS:  Has patient fallen in last 6 months? 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  No acute fracture. Valgus deformity has progressed since prior films.  Osteochondroma remains  unchanged patella notch area.  Moderate patellofemoral arthritic changes.    EDEMA:  Moderate edema Rt knee and thigh  MUSCLE LENGTH: Hamstrings: Right 65 deg; Left 70 deg Hip flexors: Right tight   POSTURE: rounded shoulders, forward head, and flexed trunk    LOWER EXTREMITY ROM: assessed in  supine   Active ROM Right eval Right  12/06/22 Left eval Right 12/12/22 Right 12/29/22  Hip flexion       Hip extension       Hip abduction       Hip adduction       Hip internal rotation       Hip external rotation       Knee flexion 65 95 122 104 112  Knee extension -11 -6 0 -4 -5  Ankle dorsiflexion       Ankle plantarflexion       Ankle inversion       Ankle eversion        (Blank rows = not tested)  LOWER EXTREMITY MMT:  MMT Right eval Left eval Right 12/26/22  Hip flexion NT 5 4+  Hip extension NT 5 4-  Hip abduction NT 5 4  Hip adduction NT 5 4  Hip internal rotation     Hip external rotation     Knee flexion NT 5 4+  Knee extension NT 5 4+  Ankle dorsiflexion     Ankle plantarflexion     Ankle inversion     Ankle eversion      (Blank rows = not tested)   FUNCTIONAL TESTS:    GAIT: Distance walked: 120 ft x 2 Assistive device utilized: SPC Level of assistance: SBA  Comments: step through gait pattern with improving weight shift and weight bearing Rt LE   [facility] Adult PT Treatment:                                                DATE: 12/29/2022 Therapeutic Exercise: NuStep L 5-7 x 10 min + subjective intake Quad set 5 sec x 10  Quad set heel resting on foal roll 5 sec x 10  SAQ 5 sec x 10  Heel slide 10 sec x 10 with strap  Wall slide 10 sec x 10  TKE ball posterior knee 5 sec x 10  TKE back at wall blue TB held by PT 3 sec x 10 Gait training working on core stabilization, even stride length Backwards walking    [facility] Adult [facility] Treatment:                                                DATE: 12/26/2022 Therapeutic Exercise: NuStep L 5-7 x 10 min + subjective intake Treadmill: 1.2 mph, 0% incline x 5 min AAROM heel slides (see above for knee flexion) 10x10"  R HS/ITB stretches 3x30" each  Seated knee extension GTB 10x5" (R) Seated knee flexion GTB x10 (R) Lateral heel taps (R SLS) x10 Lateral  lunges with slider (R SLS) x10   PATIENT EDUCATION:   Education details: Walking program --> treadmill use on bad weather days Person educated: Patient Education method: Explanation, Demonstration, Tactile cues, Verbal cues, and Handouts Education comprehension: verbalized understanding, returned demonstration, verbal cues required, tactile cues required, and needs further education  HOME EXERCISE PROGRAM: Access Code: [email or web address] URL: [email or web address] Date: 12/15/2022 Prepared by: Corlis Leak  Exercises - Supine Quad Set  - 2 x daily - 7 x weekly - 1 sets - 10 reps - 3 sec  hold - Small Range Straight Leg Raise  - 2 x daily - 7 x weekly - 1 sets - 10 reps - 5 sec  hold - Supine Hip Abduction  - 2 x daily - 7 x weekly - 1 sets - 10 reps - 3 sec  hold - Supine Heel Slide with Strap  - 2 x daily - 7 x weekly - 1 sets - 5-10 reps - 10 sec  hold - Seated Knee Flexion Stretch  - 1 x daily - 7 x weekly - 3 sets - 10 reps - Standing Hip Abduction with Counter Support  - 1 x daily - 7 x weekly - 3 sets - 10 reps - Standing Hip Extension with Counter Support  - 1 x daily - 7 x weekly - 3 sets - 10 reps - Staggered Stance Forward Backward Weight Shift with Counter Support  - 1 x daily - 7 x weekly - 3 sets - 10 reps - Side Stepping with Counter Support  - 2 x daily - 7 x weekly - 1 sets - 3 reps - 30 sec  hold - Mini Squat with Counter Support  - 2 x daily - 7 x weekly - 1-2 sets - 10 reps - 3 sec  hold - Standing Knee Flexion Stretch on Step  - 2 x daily - 7 x weekly - 1 sets - 5-10 reps - 10-20 sec  hold - Standing Marching  - 2 x daily - 7 x weekly - 1-2 sets - 10 reps - Step Up  - 2 x daily - 7 x weekly - 1 sets - 10 reps - 2 sec  hold - Standing Hip Extension with Counter Support  - 2 x daily - 7 x weekly - 1-2 sets - 10 reps - 3-5 sec  hold - Standing Hip Abduction with Counter Support  - 2 x daily - 7 x weekly - 2-3 sets - 10 reps - 2-3 sec  hold - Bridge  - 2 x daily - 7 x weekly - 1-2 sets - 10 reps - 5 sec  hold - Sidelying  Hip Abduction  - 1 x daily - 7 x weekly - 3 sets - 10 reps - 3-5 sec  hold  ASSESSMENT:  CLINICAL IMPRESSION: Hanna demonstrates improving ROM and strength Rt LE. She is progressing well toward stated goals of rehab and will benefit from continued treatment to achieve goals and reach to maximal rehab potential.    OBJECTIVE IMPAIRMENTS: Abnormal gait, decreased activity tolerance, decreased mobility, difficulty walking, decreased ROM, decreased strength, hypomobility, increased edema, increased fascial restrictions, impaired flexibility, improper body mechanics, postural dysfunction, and pain.    GOALS: Goals reviewed with patient? Yes  SHORT TERM GOALS: Target date: 12/22/2022  Patient independent in initial HEP  Baseline: Goal status: MET  2.  Patient independent in all transfers sit >/< stand </> supine  Baseline:  Goal status: MET   LONG TERM GOALS:  Target date: 01/19/2023  Increase AROM Rt knee to 0 degrees extension to 120 degrees flexion  Baseline: see above Goal status: IN PROGRESS  2.  4+/5 to 5/5 strength Rt LE Baseline:  Goal status: in progress   3.  Improved gait pattern with patient ambulatory for functional and community distances of 500-700 ft  Baseline:  Goal status: in progress   4.  Patient to report ability to walk dog for 1 mile with appropriate plan to work toward walking 3 miles   Baseline:  Goal status: in progress   5.  Independent in all transfers including floor </> chair  Baseline:  Goal status: in progress   6.  Independent in HEP including aquatic exercise program as indicated  Baseline:  Goal status: in progress    PLAN:  PT FREQUENCY: 2-3x/week  PT DURATION: 8 weeks  PLANNED INTERVENTIONS: Therapeutic exercises, Therapeutic activity, Neuromuscular re-education, Balance training, Gait training, Patient/Family education, Self Care, Joint mobilization, Aquatic Therapy, Dry Needling, Electrical stimulation, Cryotherapy, Moist heat,  scar mobilization, Taping, Ultrasound, Ionotophoresis 4mg /ml Dexamethasone, Manual therapy, and Re-evaluation  PLAN FOR NEXT SESSION: Progress exercises; gait training; manual work, DN, modalities as indicated   Kelsa Jaworowski P. Leonor Liv PT, MPH 12/29/22 2:27 PM

## 2023-01-02 ENCOUNTER — Ambulatory Visit: Payer: Medicare Other

## 2023-01-02 DIAGNOSIS — G8929 Other chronic pain: Secondary | ICD-10-CM

## 2023-01-02 DIAGNOSIS — R600 Localized edema: Secondary | ICD-10-CM

## 2023-01-02 DIAGNOSIS — M25661 Stiffness of right knee, not elsewhere classified: Secondary | ICD-10-CM

## 2023-01-02 DIAGNOSIS — M6281 Muscle weakness (generalized): Secondary | ICD-10-CM

## 2023-01-02 DIAGNOSIS — M25561 Pain in right knee: Secondary | ICD-10-CM | POA: Diagnosis not present

## 2023-01-02 DIAGNOSIS — R262 Difficulty in walking, not elsewhere classified: Secondary | ICD-10-CM

## 2023-01-02 NOTE — Therapy (Signed)
OUTPATIENT PHYSICAL THERAPY LOWER EXTREMITY TREATMENT    Patient Name: Tammy Mills MRN: [id number] DOB:[birth year], 67 y.o., female Today's Date: 01/02/2023  END OF SESSION:  PT End of Session - 01/02/23 1444     Visit Number 11    Number of Visits 20    Date for PT Re-Evaluation 01/19/23    Authorization Type medicare and [employer]    PT Start Time 1445    PT Stop Time 1528    PT Time Calculation (min) 43 min    Activity Tolerance Patient tolerated treatment well    Behavior During Therapy WFL for tasks assessed/performed             Past Medical History:  Diagnosis Date   Arthritis    GERD (gastroesophageal reflux disease)    Heart murmur    Mitral valve prolapse    mild to moderate regurgitation   Osteoporosis    Past Surgical History:  Procedure Laterality Date   BREAST EXCISIONAL BIOPSY Right    20 + yrs ago, benign   BREAST SURGERY     COLONOSCOPY     TOTAL KNEE ARTHROPLASTY Left 04/01/2022   Procedure: LEFT TOTAL KNEE ARTHROPLASTY;  Surgeon: Kathryne Hitch, MD;  Location: [facility];  Service: Orthopedics;  Laterality: Left;   TOTAL KNEE ARTHROPLASTY Right 11/25/2022   Procedure: RIGHT TOTAL KNEE ARTHROPLASTY;  Surgeon: Kathryne Hitch, MD;  Location: [facility];  Service: Orthopedics;  Laterality: Right;   WISDOM TOOTH EXTRACTION     Patient Active Problem List   Diagnosis Date Noted   Status post total right knee replacement 11/25/2022   Status post total left knee replacement 04/01/2022   Osteoporosis    Mitral valve prolapse    Osteopenia 05/07/2013   Routine general medical examination at a health care facility 04/30/2013   Right knee pain 05/08/2012   Right hip pain 05/08/2012   Knee pain, bilateral 06/03/2011   WEIGHT GAIN 06/28/2010    PCP: Dr Lynnea Ferrier  REFERRING PROVIDER: Dr Doneen Poisson  REFERRING DIAG: Rt TKA  THERAPY DIAG:  Chronic pain of right knee  Stiffness of right knee, not elsewhere classified  Muscle  weakness (generalized)  Difficulty in walking, not elsewhere classified  Localized edema  Rationale for Evaluation and Treatment: Rehabilitation  ONSET DATE: surgery 11/25/22  SUBJECTIVE:   SUBJECTIVE STATEMENT: Patient reports she has been averaging 2,00 steps a day, states she is walking more outside. Patient repots no knee pain but that it continues to get sore if she sits for too long.   PERTINENT HISTORY: Arthritis; heart murmur; Lt TKA 7/23 PAIN:  Are you having pain? Yes: NPRS scale: 0/10 Pain location: Rt knee  Pain description: aching Aggravating factors: moving knee; bending knee  Relieving factors: ice; meds  PRECAUTIONS: Post op Total Knee Arthroplasty   WEIGHT BEARING RESTRICTIONS: No  FALLS:  Has patient fallen in last 6 months? 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  No acute fracture. Valgus deformity has progressed since prior films.  Osteochondroma remains  unchanged patella notch area.  Moderate patellofemoral arthritic changes.    EDEMA:  Moderate edema Rt knee and thigh  MUSCLE LENGTH: Hamstrings: Right 65 deg; Left 70 deg Hip flexors: Right tight   POSTURE: rounded shoulders, forward head, and flexed trunk    LOWER EXTREMITY ROM: assessed in supine   Active ROM Right eval Right  12/06/22 Left eval Right 12/12/22 Right 12/29/22  Hip flexion       Hip extension        Hip abduction       Hip adduction       Hip internal rotation       Hip external rotation       Knee flexion 65 95 122 104 112  Knee extension -11 -6 0 -4 -5  Ankle dorsiflexion       Ankle plantarflexion       Ankle inversion       Ankle eversion        (Blank rows = not tested)  LOWER EXTREMITY MMT:  MMT Right eval Left eval Right 12/26/22  Hip flexion NT 5 4+  Hip extension NT 5 4-  Hip abduction NT 5 4  Hip adduction NT 5 4  Hip internal rotation     Hip external rotation     Knee flexion NT 5 4+  Knee extension NT 5 4+  Ankle dorsiflexion     Ankle plantarflexion     Ankle inversion     Ankle eversion      (Blank rows = not tested)   FUNCTIONAL TESTS:    GAIT: Distance walked: 120 ft x 2 Assistive device utilized: SPC Level of assistance: SBA  Comments: step through gait pattern with improving weight shift and weight bearing Rt LE   [facility] Adult PT Treatment:                                                DATE: 11/03/2022 Therapeutic Exercise: Leg press: DL 16# (P4, heels off) X09 --> R SL 40# x10 Wall squat + ball squeeze 2x10 Standing TKE GTB 2x10x5" Therapeutic Activity: Walking outside: slopes, fwd/bkwd, uneven ground, steps Gait training - focus on dynamic knee flexion/swing phase    [facility] Adult PT Treatment:                                                DATE: 12/29/2022 Therapeutic Exercise: NuStep L 5-7 x 10 min + subjective intake Quad set 5 sec x 10  Quad set heel resting on foal roll 5 sec x 10  SAQ 5 sec x 10  Heel slide 10 sec x 10 with strap  Wall slide 10 sec x 10  TKE ball posterior knee 5 sec x 10  TKE back at wall blue TB held by PT 3 sec x 10 Gait training working on core stabilization, even stride length Backwards walking    PATIENT EDUCATION:  Education details: Walking program --> treadmill use on bad weather days Person educated: Patient Education method: Explanation, Demonstration, Tactile cues, Verbal cues, and  Handouts Education comprehension: verbalized understanding, returned demonstration, verbal cues required, tactile cues required, and needs further education  HOME EXERCISE PROGRAM: Access Code: [email or web address] URL: [email or web address] Date: 01/02/2023 Prepared by: Carlynn Herald  Exercises - Supine Quad Set  - 2 x daily - 7 x weekly - 1 sets - 10 reps - 3 sec  hold - Small Range Straight Leg Raise  - 2 x daily - 7 x weekly - 1 sets - 10 reps - 5 sec  hold - Supine Hip Abduction  - 2 x daily - 7 x weekly - 1 sets - 10 reps - 3 sec  hold - Supine Heel Slide with Strap  - 2 x daily - 7 x weekly - 1 sets - 5-10 reps - 10 sec  hold - Seated Knee Flexion Stretch  - 1 x daily - 7 x weekly - 3 sets - 10 reps - Standing Hip Abduction with Counter Support  - 1 x daily - 7 x weekly - 3 sets - 10 reps - Standing Hip Extension with Counter Support  - 1 x daily - 7 x weekly - 3 sets - 10 reps - Staggered Stance Forward Backward Weight Shift with Counter Support  - 1 x daily - 7 x weekly - 3 sets - 10 reps - Side Stepping with Counter Support  - 2 x daily - 7 x weekly - 1 sets - 3 reps - 30 sec  hold - Mini Squat with Counter Support  - 2 x daily - 7 x weekly - 1-2 sets - 10 reps - 3 sec  hold - Standing Knee Flexion Stretch on Step  - 2 x daily - 7 x weekly - 1 sets - 5-10 reps - 10-20 sec  hold - Standing Marching  - 2 x daily - 7 x weekly - 1-2 sets - 10 reps - Step Up  - 2 x daily - 7 x weekly - 1 sets - 10 reps - 2 sec  hold - Standing Hip Extension with Counter Support  - 2 x daily - 7 x weekly - 1-2 sets - 10 reps - 3-5 sec  hold - Standing Hip Abduction with Counter Support  - 2 x daily - 7 x weekly - 2-3 sets - 10 reps - 2-3 sec  hold - Bridge  - 2 x daily - 7 x weekly - 1-2 sets - 10 reps - 5 sec  hold - Sidelying Hip Abduction  - 1 x daily - 7 x weekly - 3 sets - 10 reps - 3-5 sec  hold - Standing Terminal Knee Extension with Resistance  - 1 x daily - 7 x weekly - 3 sets - 10 reps - 5  sec hold - Wall Squat with Ball between Knees  - 1 x daily - 7 x weekly - 3 sets - 10 reps  ASSESSMENT:  CLINICAL IMPRESSION: Gait training performed outdoors to challenge stability with uneven terrain and slopes. Mild fatigue noted with prolonged outdoor activities.    OBJECTIVE IMPAIRMENTS: Abnormal gait, decreased activity tolerance, decreased mobility, difficulty walking, decreased ROM, decreased strength, hypomobility, increased edema, increased fascial restrictions, impaired flexibility, improper body mechanics, postural dysfunction, and pain.    GOALS: Goals reviewed with patient? Yes  SHORT TERM GOALS: Target date: 12/22/2022  Patient independent in initial HEP  Baseline: Goal status: MET  2.  Patient independent in all transfers sit >/< stand </> supine  Baseline:  Goal status: MET   LONG TERM GOALS:  Target date: 01/19/2023  Increase AROM Rt knee to 0 degrees extension to 120 degrees flexion  Baseline: see above Goal status: IN PROGRESS  2.  4+/5 to 5/5 strength Rt LE Baseline:  Goal status: in progress   3.  Improved gait pattern with patient ambulatory for functional and community distances of 500-700 ft  Baseline:  Goal status: in progress   4.  Patient to report ability to walk dog for 1 mile with appropriate plan to work toward walking 3 miles   Baseline:  Goal status: in progress   5.  Independent in all transfers including floor </> chair  Baseline:  Goal status: in progress   6.  Independent in HEP including aquatic exercise program as indicated  Baseline:  Goal status: in progress    PLAN:  PT FREQUENCY: 2-3x/week  PT DURATION: 8 weeks  PLANNED INTERVENTIONS: Therapeutic exercises, Therapeutic activity, Neuromuscular re-education, Balance training, Gait training, Patient/Family education, Self Care, Joint mobilization, Aquatic Therapy, Dry Needling, Electrical stimulation, Cryotherapy, Moist heat, scar mobilization, Taping, Ultrasound,  Ionotophoresis 4mg /ml Dexamethasone, Manual therapy, and Re-evaluation  PLAN FOR NEXT SESSION: Progress exercises; gait training; manual work, DN, modalities as indicated   Carlynn Herald, PTA 01/02/2023 3:31 PM

## 2023-01-03 ENCOUNTER — Ambulatory Visit
Admission: RE | Admit: 2023-01-03 | Discharge: 2023-01-03 | Disposition: A | Discharge status: Home / Self Care | Payer: Medicare Other | Source: Ambulatory Visit | Attending: Family Medicine | Admitting: Family Medicine

## 2023-01-03 DIAGNOSIS — Z1231 Encounter for screening mammogram for malignant neoplasm of breast: Secondary | ICD-10-CM

## 2023-01-04 ENCOUNTER — Ambulatory Visit: Payer: Medicare Other | Attending: Orthopaedic Surgery

## 2023-01-04 DIAGNOSIS — R600 Localized edema: Secondary | ICD-10-CM | POA: Diagnosis present

## 2023-01-04 DIAGNOSIS — M25561 Pain in right knee: Secondary | ICD-10-CM | POA: Diagnosis present

## 2023-01-04 DIAGNOSIS — M25661 Stiffness of right knee, not elsewhere classified: Secondary | ICD-10-CM | POA: Diagnosis present

## 2023-01-04 DIAGNOSIS — R262 Difficulty in walking, not elsewhere classified: Secondary | ICD-10-CM | POA: Insufficient documentation

## 2023-01-04 DIAGNOSIS — G8929 Other chronic pain: Secondary | ICD-10-CM

## 2023-01-04 DIAGNOSIS — M6281 Muscle weakness (generalized): Secondary | ICD-10-CM

## 2023-01-04 NOTE — Therapy (Signed)
OUTPATIENT PHYSICAL THERAPY LOWER EXTREMITY TREATMENT    Patient Name: Tammy Mills MRN: [id number] DOB:Jul 05, 1956, 67 y.o., female Today's Date: 01/04/2023  END OF SESSION:  PT End of Session - 01/04/23 1404     Visit Number 12    Number of Visits 20    Date for PT Re-Evaluation 01/19/23    Authorization Type medicare and [employer]    PT Start Time 1402    PT Stop Time 1442    PT Time Calculation (min) 40 min    Activity Tolerance Patient tolerated treatment well    Behavior During Therapy WFL for tasks assessed/performed             Past Medical History:  Diagnosis Date   Arthritis    GERD (gastroesophageal reflux disease)    Heart murmur    Mitral valve prolapse    mild to moderate regurgitation   Osteoporosis    Past Surgical History:  Procedure Laterality Date   BREAST EXCISIONAL BIOPSY Right    20 + yrs ago, benign   BREAST SURGERY     COLONOSCOPY     TOTAL KNEE ARTHROPLASTY Left 04/01/2022   Procedure: LEFT TOTAL KNEE ARTHROPLASTY;  Surgeon: Kathryne Hitch, MD;  Location: [facility];  Service: Orthopedics;  Laterality: Left;   TOTAL KNEE ARTHROPLASTY Right 11/25/2022   Procedure: RIGHT TOTAL KNEE ARTHROPLASTY;  Surgeon: Kathryne Hitch, MD;  Location: [facility];  Service: Orthopedics;  Laterality: Right;   WISDOM TOOTH EXTRACTION     Patient Active Problem List   Diagnosis Date Noted   Status post total right knee replacement 11/25/2022   Status post total left knee replacement 04/01/2022   Osteoporosis    Mitral valve prolapse    Osteopenia 05/07/2013   Routine general medical examination at a health care facility 04/30/2013   Right knee pain 05/08/2012   Right hip pain 05/08/2012   Knee pain, bilateral 06/03/2011   WEIGHT GAIN 06/28/2010    PCP: Dr Lynnea Ferrier  REFERRING PROVIDER: Dr Doneen Poisson  REFERRING DIAG: Rt TKA  THERAPY DIAG:  Chronic pain of right knee  Stiffness of right knee, not elsewhere classified  Muscle  weakness (generalized)  Difficulty in walking, not elsewhere classified  Localized edema  Rationale for Evaluation and Treatment: Rehabilitation  ONSET DATE: surgery 11/25/22  SUBJECTIVE:   SUBJECTIVE STATEMENT: Patient reports she is performing HEP twice a day, reports no pain today.  PERTINENT HISTORY: Arthritis; heart murmur; Lt TKA 7/23 PAIN:  Are you having pain? Yes: NPRS scale: 0/10 Pain location: Rt knee  Pain description: aching Aggravating factors: moving knee; bending knee  Relieving factors: ice; meds  PRECAUTIONS: Post op Total Knee Arthroplasty   WEIGHT BEARING RESTRICTIONS: No  FALLS:  Has patient fallen in last 6 months? 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  No acute fracture. Valgus deformity has progressed since  prior films.  Osteochondroma remains  unchanged patella notch area.  Moderate patellofemoral arthritic changes.    EDEMA:  Moderate edema Rt knee and thigh  MUSCLE LENGTH: Hamstrings: Right 65 deg; Left 70 deg Hip flexors: Right tight   POSTURE: rounded shoulders, forward head, and flexed trunk    LOWER EXTREMITY ROM: assessed in supine   Active ROM Right eval Right  12/06/22 Left eval Right 12/12/22 Right 12/29/22  Hip flexion       Hip extension       Hip abduction       Hip adduction       Hip internal rotation       Hip external rotation       Knee  flexion 65 95 122 104 112  Knee extension -11 -6 0 -4 -5  Ankle dorsiflexion       Ankle plantarflexion       Ankle inversion       Ankle eversion        (Blank rows = not tested)  LOWER EXTREMITY MMT:  MMT Right eval Left eval Right 12/26/22  Hip flexion NT 5 4+  Hip extension NT 5 4-  Hip abduction NT 5 4  Hip adduction NT 5 4  Hip internal rotation     Hip external rotation     Knee flexion NT 5 4+  Knee extension NT 5 4+  Ankle dorsiflexion     Ankle plantarflexion     Ankle inversion     Ankle eversion      (Blank rows = not tested)   FUNCTIONAL TESTS:    GAIT: Distance walked: 120 ft x 2 Assistive device utilized: SPC Level of assistance: SBA  Comments: step through gait pattern with improving weight shift and weight bearing Rt LE   [facility] Adult PT Treatment:                                                DATE: 01/04/2023 Therapeutic Activity: Outdoors: uneven terrain, stairs, slopes: Fwd/bkwd/side walking on even & uneven ground --> fwd/bkwd zig-zag stepping on grass 10#KB suitcase carry -->10#KB farmer carry fwd walking 10#KB purse carry (orange super band) TKE orange superband --> HHA PRN to challenge postural stabiltiy and dynamic balance Walking up/down slopes Stair navigation   [facility] Adult PT Treatment:                                                DATE: 11/03/2022 Therapeutic Exercise: Leg press: DL 16# (P4, heels off) X09 --> R SL 40# x10 Wall squat + ball squeeze 2x10 Standing TKE GTB 2x10x5" Therapeutic Activity: Walking outside: slopes, fwd/bkwd, uneven ground, steps Gait training - focus on dynamic knee flexion/swing phase    [facility] Adult PT Treatment:                                                DATE: 12/29/2022 Therapeutic Exercise: NuStep L 5-7 x 10 min + subjective intake Quad set 5 sec x 10  Quad set heel resting on foal roll 5 sec x 10  SAQ 5 sec x 10  Heel slide 10 sec x 10 with strap  Wall slide 10 sec x 10  TKE ball posterior  knee 5 sec x 10  TKE back at wall blue TB held by PT 3 sec x 10 Gait training working on core stabilization, even stride length Backwards walking    PATIENT EDUCATION:  Education details: Walking program --> treadmill use on bad weather days Person educated: Patient Education method: Programmer, multimedia, Facilities manager, Actor cues, Verbal cues, and Handouts Education comprehension: verbalized understanding, returned demonstration, verbal cues required, tactile cues required, and needs further education  HOME EXERCISE PROGRAM: Access Code: [email or web address] URL: [email or web address] Date: 01/02/2023 Prepared by: Carlynn Herald  Exercises - Supine Quad Set  - 2 x daily - 7 x weekly - 1 sets - 10 reps - 3 sec  hold - Small Range Straight Leg Raise  - 2 x daily - 7 x weekly - 1 sets - 10 reps - 5 sec  hold - Supine Hip Abduction  - 2 x daily - 7 x weekly - 1 sets - 10 reps - 3 sec  hold - Supine Heel Slide with Strap  - 2 x daily - 7 x weekly - 1 sets - 5-10 reps - 10 sec  hold - Seated Knee Flexion Stretch  - 1 x daily - 7 x weekly - 3 sets - 10 reps - Standing Hip Abduction with Counter Support  - 1 x daily - 7 x weekly - 3 sets - 10 reps - Standing Hip Extension with Counter Support  - 1 x daily - 7 x weekly - 3 sets - 10 reps - Staggered Stance Forward Backward Weight Shift with Counter Support  - 1 x daily - 7 x weekly - 3 sets - 10 reps - Side Stepping with Counter Support  - 2 x daily - 7 x weekly - 1 sets - 3 reps - 30 sec  hold - Mini Squat with Counter Support  - 2 x daily - 7 x weekly - 1-2 sets - 10 reps - 3 sec  hold - Standing Knee Flexion Stretch on Step  - 2 x daily - 7 x weekly - 1 sets - 5-10 reps - 10-20 sec  hold - Standing Marching  - 2 x daily - 7 x weekly - 1-2 sets - 10 reps - Step Up  - 2 x daily - 7 x weekly - 1 sets - 10 reps - 2 sec  hold - Standing Hip Extension with Counter Support  - 2 x daily - 7 x weekly - 1-2 sets - 10 reps - 3-5 sec  hold - Standing Hip  Abduction with Counter Support  - 2 x daily - 7 x weekly - 2-3 sets - 10 reps - 2-3 sec  hold - Bridge  - 2 x daily - 7 x weekly - 1-2 sets - 10 reps - 5 sec  hold - Sidelying Hip Abduction  - 1 x daily - 7 x weekly - 3 sets - 10 reps - 3-5 sec  hold - Standing Terminal Knee Extension with Resistance  - 1 x daily - 7 x weekly - 3 sets - 10 reps - 5 sec hold - Wall Squat with Ball between Knees  - 1 x daily - 7 x weekly - 3 sets - 10 reps  ASSESSMENT:  CLINICAL IMPRESSION: Outdoor activities continued to challenge reactive strategies and dynamic knee stability and strengthening. Kettlebell carries incorporated to progress  core stability. Patient continues to be slightly winded with prolonged stair navigation.   OBJECTIVE IMPAIRMENTS: Abnormal gait, decreased activity tolerance, decreased mobility, difficulty walking, decreased ROM, decreased strength, hypomobility, increased edema, increased fascial restrictions, impaired flexibility, improper body mechanics, postural dysfunction, and pain.    GOALS: Goals reviewed with patient? Yes  SHORT TERM GOALS: Target date: 12/22/2022  Patient independent in initial HEP  Baseline: Goal status: MET  2.  Patient independent in all transfers sit >/< stand </> supine  Baseline:  Goal status: MET   LONG TERM GOALS: Target date: 01/19/2023  Increase AROM Rt knee to 0 degrees extension to 120 degrees flexion  Baseline: see above Goal status: IN PROGRESS  2.  4+/5 to 5/5 strength Rt LE Baseline:  Goal status: in progress   3.  Improved gait pattern with patient ambulatory for functional and community distances of 500-700 ft  Baseline:  Goal status: in progress   4.  Patient to report ability to walk dog for 1 mile with appropriate plan to work toward walking 3 miles   Baseline:  Goal status: in progress   5.  Independent in all transfers including floor </> chair  Baseline:  Goal status: in progress   6.  Independent in HEP including  aquatic exercise program as indicated  Baseline:  Goal status: in progress    PLAN:  PT FREQUENCY: 2-3x/week  PT DURATION: 8 weeks  PLANNED INTERVENTIONS: Therapeutic exercises, Therapeutic activity, Neuromuscular re-education, Balance training, Gait training, Patient/Family education, Self Care, Joint mobilization, Aquatic Therapy, Dry Needling, Electrical stimulation, Cryotherapy, Moist heat, scar mobilization, Taping, Ultrasound, Ionotophoresis 4mg /ml Dexamethasone, Manual therapy, and Re-evaluation  PLAN FOR NEXT SESSION: Progress exercises; gait training; manual work, DN, modalities as indicated   Carlynn Herald, PTA 01/04/2023 2:43 PM

## 2023-01-05 ENCOUNTER — Telehealth: Payer: Self-pay | Admitting: *Deleted

## 2023-01-05 ENCOUNTER — Encounter: Payer: Self-pay | Admitting: Orthopaedic Surgery

## 2023-01-05 ENCOUNTER — Ambulatory Visit (INDEPENDENT_AMBULATORY_CARE_PROVIDER_SITE_OTHER): Payer: Medicare Other | Admitting: Orthopaedic Surgery

## 2023-01-05 ENCOUNTER — Other Ambulatory Visit: Payer: Self-pay | Admitting: Orthopaedic Surgery

## 2023-01-05 DIAGNOSIS — Z96651 Presence of right artificial knee joint: Secondary | ICD-10-CM

## 2023-01-05 MED ORDER — TIZANIDINE HCL 2 MG PO TABS: ORAL_TABLET | ORAL | 0 refills | Status: AC

## 2023-01-05 MED ORDER — HYDROCODONE-ACETAMINOPHEN 5-325 MG PO TABS: 1.0000 | ORAL_TABLET | Freq: Four times a day (QID) | ORAL | 0 refills | Status: AC | PRN

## 2023-01-05 NOTE — Progress Notes (Signed)
The patient is now 6 weeks status post a right total knee arthroplasty.  She is active 67 year old female.  On exam her extension is almost full and her flexion is almost full.  There is swelling to be expected but overall she actually looks very good and she has been pushing herself hard.  I am very proud of her.  She does need a refill of hydrocodone and Zanaflex.  I will send these in for her.  She will continue pushing herself hard to therapy.  She can drop from my standpoint.  I will see her back in a month to see how she is doing overall but no x-rays are needed.

## 2023-01-05 NOTE — Telephone Encounter (Signed)
Ortho bundle in office meeting completed. 

## 2023-01-09 ENCOUNTER — Ambulatory Visit: Payer: Medicare Other

## 2023-01-09 DIAGNOSIS — M25661 Stiffness of right knee, not elsewhere classified: Secondary | ICD-10-CM

## 2023-01-09 DIAGNOSIS — M6281 Muscle weakness (generalized): Secondary | ICD-10-CM

## 2023-01-09 DIAGNOSIS — R600 Localized edema: Secondary | ICD-10-CM

## 2023-01-09 DIAGNOSIS — G8929 Other chronic pain: Secondary | ICD-10-CM

## 2023-01-09 DIAGNOSIS — M25561 Pain in right knee: Secondary | ICD-10-CM | POA: Diagnosis not present

## 2023-01-09 DIAGNOSIS — R262 Difficulty in walking, not elsewhere classified: Secondary | ICD-10-CM

## 2023-01-09 NOTE — Therapy (Signed)
OUTPATIENT PHYSICAL THERAPY LOWER EXTREMITY TREATMENT    Patient Name: Tammy Mills MRN: [id number] DOB:02-17-1956, 67 y.o., female Today's Date: 01/09/2023  END OF SESSION:  PT End of Session - 01/09/23 1407     Visit Number 13    Number of Visits 20    Date for PT Re-Evaluation 01/19/23    Authorization Type medicare and [employer]    PT Start Time 1405    PT Stop Time 1444    PT Time Calculation (min) 39 min    Activity Tolerance Patient tolerated treatment well    Behavior During Therapy WFL for tasks assessed/performed             Past Medical History:  Diagnosis Date   Arthritis    GERD (gastroesophageal reflux disease)    Heart murmur    Mitral valve prolapse    mild to moderate regurgitation   Osteoporosis    Past Surgical History:  Procedure Laterality Date   BREAST EXCISIONAL BIOPSY Right    20 + yrs ago, benign   BREAST SURGERY     COLONOSCOPY     TOTAL KNEE ARTHROPLASTY Left 04/01/2022   Procedure: LEFT TOTAL KNEE ARTHROPLASTY;  Surgeon: Kathryne Hitch, MD;  Location: [facility];  Service: Orthopedics;  Laterality: Left;   TOTAL KNEE ARTHROPLASTY Right 11/25/2022   Procedure: RIGHT TOTAL KNEE ARTHROPLASTY;  Surgeon: Kathryne Hitch, MD;  Location: [facility];  Service: Orthopedics;  Laterality: Right;   WISDOM TOOTH EXTRACTION     Patient Active Problem List   Diagnosis Date Noted   Status post total right knee replacement 11/25/2022   Status post total left knee replacement 04/01/2022   Osteoporosis    Mitral valve prolapse    Osteopenia 05/07/2013   Routine general medical examination at a health care facility 04/30/2013   Right knee pain 05/08/2012   Right hip pain 05/08/2012   Knee pain, bilateral 06/03/2011   WEIGHT GAIN 06/28/2010    PCP: Dr Lynnea Ferrier  REFERRING PROVIDER: Dr Doneen Poisson  REFERRING DIAG: Rt TKA  THERAPY DIAG:  Chronic pain of right knee  Stiffness of right knee, not elsewhere classified  Muscle  weakness (generalized)  Difficulty in walking, not elsewhere classified  Localized edema  Rationale for Evaluation and Treatment: Rehabilitation  ONSET DATE: surgery 11/25/22  SUBJECTIVE:   SUBJECTIVE STATEMENT: Patient reports no changes since last visit.  PERTINENT HISTORY: Arthritis; heart murmur; Lt TKA 7/23 PAIN:  Are you having pain? Yes: NPRS scale: 0/10 Pain location: Rt knee  Pain description: aching Aggravating factors: moving knee; bending knee  Relieving factors: ice; meds  PRECAUTIONS: Post op Total Knee Arthroplasty   WEIGHT BEARING RESTRICTIONS: No  FALLS:  Has patient fallen in last 6 months? 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  No acute fracture. Valgus deformity has progressed since prior films.  Osteochondroma remains  unchanged patella notch area.  Moderate patellofemoral arthritic changes.    EDEMA:  Moderate edema Rt knee and thigh  MUSCLE LENGTH: Hamstrings: Right 65 deg; Left 70 deg Hip flexors: Right tight   POSTURE: rounded shoulders, forward head, and flexed trunk    LOWER EXTREMITY ROM: assessed in supine   Active ROM Right eval Right  12/06/22 Left eval Right 12/12/22 Right 12/29/22 Right 01/09/23   Hip flexion        Hip extension        Hip abduction        Hip adduction        Hip internal rotation        Hip external rotation        Knee  flexion 65 95 122 104 112 122  Knee extension -11 -6 0 -4 -5 -3  Ankle dorsiflexion        Ankle plantarflexion        Ankle inversion        Ankle eversion         (Blank rows = not tested)  LOWER EXTREMITY MMT:  MMT Right eval Left eval Right 12/26/22 Right 01/09/23  Hip flexion NT 5 4+ 4+  Hip extension NT 5 4- 4+  Hip abduction NT 5 4 4   Hip adduction NT 5 4 4+  Hip internal rotation      Hip external rotation      Knee flexion NT 5 4+ 5  Knee extension NT 5 4+ 5  Ankle dorsiflexion      Ankle plantarflexion      Ankle inversion      Ankle eversion       (Blank rows = not tested)   FUNCTIONAL TESTS:    GAIT: Distance walked: 120 ft x 2 Assistive device utilized: SPC Level of assistance: SBA  Comments: step through gait pattern with improving weight shift and weight bearing Rt LE    [facility] Adult PT Treatment:                                                DATE: 01/09/2023 Therapeutic Exercise: Recumbent bike L5 x 5 min Resisted side stepping RTB crossed at ankles Resisted fwd zig-zag stepping RTB crossed at ankles AAROM --> AROM heel slides  Knee flexion & extension measurements (see above) Hip & knee MMT Leg press: DL 91# (P4, heels off) 4N82 --> R SL 40# x10    [facility] Adult PT Treatment:                                                DATE: 01/04/2023 Therapeutic Activity: Outdoors: uneven terrain, stairs, slopes: Fwd/bkwd/side walking on even & uneven ground --> fwd/bkwd zig-zag stepping on grass 10#KB suitcase carry -->10#KB farmer carry fwd walking 10#KB purse carry (orange super band) TKE orange superband --> HHA PRN to challenge postural stabiltiy and dynamic balance Walking up/down slopes Stair navigation   PATIENT EDUCATION:  Education details: Walking program --> treadmill use on bad weather days Person educated: Patient Education method: Programmer, multimedia, Facilities manager, Actor cues, Verbal cues, and Handouts Education comprehension: verbalized  understanding, returned demonstration, verbal cues required, tactile cues required, and needs further education  HOME EXERCISE PROGRAM:  Access Code: [email or web address] URL: [email or web address] Date: 01/02/2023 Prepared by: Carlynn Herald  Exercises - Supine Quad Set  - 2 x daily - 7 x weekly - 1 sets - 10 reps - 3 sec  hold - Small Range Straight Leg Raise  - 2 x daily - 7 x weekly - 1 sets - 10 reps - 5 sec  hold - Supine Hip Abduction  - 2 x daily - 7 x weekly - 1 sets - 10 reps - 3 sec  hold - Supine Heel Slide with Strap  - 2 x daily - 7 x weekly - 1 sets - 5-10 reps - 10 sec  hold - Seated Knee Flexion Stretch  - 1 x daily - 7 x weekly - 3 sets - 10 reps - Standing Hip Abduction with Counter Support  - 1 x daily - 7 x weekly - 3 sets - 10 reps - Standing Hip Extension with Counter Support  - 1 x daily - 7 x weekly - 3 sets - 10 reps - Staggered Stance Forward Backward Weight Shift with Counter Support  - 1 x daily - 7 x weekly - 3 sets - 10 reps - Side Stepping with Counter Support  - 2 x daily - 7 x weekly - 1 sets - 3 reps - 30 sec  hold - Mini Squat with Counter Support  - 2 x daily - 7 x weekly - 1-2 sets - 10 reps - 3 sec  hold - Standing Knee Flexion Stretch on Step  - 2 x daily - 7 x weekly - 1 sets - 5-10 reps - 10-20 sec  hold - Standing Marching  - 2 x daily - 7 x weekly - 1-2 sets - 10 reps - Step Up  - 2 x daily - 7 x weekly - 1 sets - 10 reps - 2 sec  hold - Standing Hip Extension with Counter Support  - 2 x daily - 7 x weekly - 1-2 sets - 10 reps - 3-5 sec  hold - Standing Hip Abduction with Counter Support  - 2 x daily - 7 x weekly - 2-3 sets - 10 reps - 2-3 sec  hold - Bridge  - 2 x daily - 7 x weekly - 1-2 sets - 10 reps - 5 sec  hold - Sidelying Hip Abduction  - 1 x daily - 7 x weekly - 3 sets - 10 reps - 3-5 sec  hold - Standing Terminal Knee Extension with Resistance  - 1 x daily - 7 x weekly - 3 sets - 10 reps - 5 sec hold - Wall Squat with Ball between Knees   - 1 x daily - 7 x weekly - 3 sets - 10 reps  ASSESSMENT:  CLINICAL IMPRESSION: Good progression with knee flexion to 122 degrees; ease of mobility and ROM noted with active heel slides in supine. Patient able to complete all exercises with no adverse affects.   OBJECTIVE IMPAIRMENTS: Abnormal gait, decreased activity tolerance, decreased mobility, difficulty walking, decreased ROM, decreased strength, hypomobility, increased edema, increased fascial restrictions, impaired flexibility, improper body mechanics, postural dysfunction, and pain.    GOALS: Goals reviewed with patient? Yes  SHORT TERM GOALS: Target date: 12/22/2022  Patient independent in initial HEP  Baseline: Goal status: MET  2.  Patient independent in all transfers sit >/< stand </> supine  Baseline:  Goal status: MET   LONG TERM GOALS: Target date: 01/19/2023  Increase AROM Rt knee to 0 degrees  extension to 120 degrees flexion  Baseline: see above Goal status: IN PROGRESS  2.  4+/5 to 5/5 strength Rt LE Baseline:  Goal status: in progress   3.  Improved gait pattern with patient ambulatory for functional and community distances of 500-700 ft  Baseline:  Goal status: in progress   4.  Patient to report ability to walk dog for 1 mile with appropriate plan to work toward walking 3 miles   Baseline:  Goal status: MET  5.  Independent in all transfers including floor </> chair  Baseline:  Goal status: in progress   6.  Independent in HEP including aquatic exercise program as indicated  Baseline:  Goal status: in progress    PLAN:  PT FREQUENCY: 2-3x/week  PT DURATION: 8 weeks  PLANNED INTERVENTIONS: Therapeutic exercises, Therapeutic activity, Neuromuscular re-education, Balance training, Gait training, Patient/Family education, Self Care, Joint mobilization, Aquatic Therapy, Dry Needling, Electrical stimulation, Cryotherapy, Moist heat, scar mobilization, Taping, Ultrasound, Ionotophoresis 4mg /ml  Dexamethasone, Manual therapy, and Re-evaluation  PLAN FOR NEXT SESSION: Progress exercises; gait training; manual work, DN, modalities as indicated   Carlynn Herald, PTA 01/09/2023 2:44 PM

## 2023-01-12 ENCOUNTER — Encounter: Payer: Self-pay | Admitting: Rehabilitative and Restorative Service Providers"

## 2023-01-12 ENCOUNTER — Ambulatory Visit: Payer: Medicare Other | Admitting: Rehabilitative and Restorative Service Providers"

## 2023-01-12 DIAGNOSIS — R600 Localized edema: Secondary | ICD-10-CM

## 2023-01-12 DIAGNOSIS — M25661 Stiffness of right knee, not elsewhere classified: Secondary | ICD-10-CM

## 2023-01-12 DIAGNOSIS — M25561 Pain in right knee: Secondary | ICD-10-CM | POA: Diagnosis not present

## 2023-01-12 DIAGNOSIS — R262 Difficulty in walking, not elsewhere classified: Secondary | ICD-10-CM

## 2023-01-12 DIAGNOSIS — M6281 Muscle weakness (generalized): Secondary | ICD-10-CM

## 2023-01-12 DIAGNOSIS — G8929 Other chronic pain: Secondary | ICD-10-CM

## 2023-01-12 NOTE — Therapy (Signed)
OUTPATIENT PHYSICAL THERAPY LOWER EXTREMITY TREATMENT    Patient Name: Tammy Mills MRN: [id number] DOB:June 22, 1956, 67 y.o., female Today's Date: 01/12/2023  END OF SESSION:  PT End of Session - 01/12/23 1442     Visit Number 14    Number of Visits 20    Date for PT Re-Evaluation 01/19/23    Authorization Type medicare and [employer]    Progress Note Due on Visit 20    PT Start Time 1442    PT Stop Time 1527    PT Time Calculation (min) 45 min    Activity Tolerance Patient tolerated treatment well             Past Medical History:  Diagnosis Date   Arthritis    GERD (gastroesophageal reflux disease)    Heart murmur    Mitral valve prolapse    mild to moderate regurgitation   Osteoporosis    Past Surgical History:  Procedure Laterality Date   BREAST EXCISIONAL BIOPSY Right    20 + yrs ago, benign   BREAST SURGERY     COLONOSCOPY     TOTAL KNEE ARTHROPLASTY Left 04/01/2022   Procedure: LEFT TOTAL KNEE ARTHROPLASTY;  Surgeon: Kathryne Hitch, MD;  Location: [facility];  Service: Orthopedics;  Laterality: Left;   TOTAL KNEE ARTHROPLASTY Right 11/25/2022   Procedure: RIGHT TOTAL KNEE ARTHROPLASTY;  Surgeon: Kathryne Hitch, MD;  Location: [facility];  Service: Orthopedics;  Laterality: Right;   WISDOM TOOTH EXTRACTION     Patient Active Problem List   Diagnosis Date Noted   Status post total right knee replacement 11/25/2022   Status post total left knee replacement 04/01/2022   Osteoporosis    Mitral valve prolapse    Osteopenia 05/07/2013   Routine general medical examination at a health care facility 04/30/2013   Right knee pain 05/08/2012   Right hip pain 05/08/2012   Knee pain, bilateral 06/03/2011   WEIGHT GAIN 06/28/2010    PCP: Dr Lynnea Ferrier  REFERRING PROVIDER: Dr Doneen Poisson  REFERRING DIAG: Rt TKA  THERAPY DIAG:  Chronic pain of right knee  Stiffness of right knee, not elsewhere classified  Muscle weakness  (generalized)  Difficulty in walking, not elsewhere classified  Localized edema  Rationale for Evaluation and Treatment: Rehabilitation  ONSET DATE: surgery 11/25/22  SUBJECTIVE:   SUBJECTIVE STATEMENT: Patient reports some stiffness in the Rt knee which she notices first thing in the morning and after she is sitting for a while. She was seen by MD last Thursday and he is pleased with her progress. She is trying a thigh high compression sock today and that does feel good. She is still working on her exercises at home. Feels she needs to work on straightening and her walking. She can start out without the cane but notices that her posture gets more and more bent forward.   PERTINENT HISTORY: Arthritis; heart murmur; Lt TKA 7/23 PAIN:  Are you having pain? Yes: NPRS scale: 0/10 Pain location: Rt knee  Pain description: aching Aggravating factors: moving knee; bending knee  Relieving factors: ice; meds  PRECAUTIONS: Post op Total Knee Arthroplasty   WEIGHT BEARING RESTRICTIONS: No  FALLS:  Has patient fallen in last 6 months? 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  No acute fracture. Valgus deformity has progressed since prior films.  Osteochondroma remains  unchanged patella notch area.  Moderate patellofemoral arthritic changes.    EDEMA:  Moderate edema Rt knee and thigh  MUSCLE  LENGTH: Hamstrings: Right 65 deg; Left 70 deg Hip flexors: Right tight   POSTURE: rounded shoulders, forward head, and flexed trunk    LOWER EXTREMITY ROM: assessed in supine   Active ROM Right eval Right  12/06/22 Left eval Right 12/12/22 Right 12/29/22 Right 01/09/23   Hip flexion        Hip extension        Hip abduction        Hip adduction        Hip internal rotation        Hip external rotation        Knee flexion 65 95 122 104 112 122  Knee extension -11 -6 0 -4 -5 -3  Ankle dorsiflexion        Ankle plantarflexion        Ankle inversion        Ankle eversion         (Blank rows = not tested)  LOWER EXTREMITY MMT:  MMT Right eval Left eval Right 12/26/22 Right 01/09/23  Hip flexion NT 5 4+ 4+  Hip extension NT 5 4- 4+  Hip abduction NT 5 4 4   Hip adduction NT 5 4 4+  Hip internal rotation      Hip external rotation      Knee flexion NT 5 4+ 5  Knee extension NT 5 4+ 5  Ankle dorsiflexion      Ankle plantarflexion      Ankle inversion      Ankle eversion       (Blank rows = not tested)   FUNCTIONAL TESTS:    GAIT: Distance walked: 120 ft x 2 Assistive device utilized: SPC Level of assistance: SBA  Comments: step through gait pattern with improving weight shift and weight bearing Rt LE    [facility] Adult PT Treatment:                                                DATE: 01/12/2023 Therapeutic Exercise: Recumbent bike L3 --> L5 x 7 min Mini-Squat without UE support x 20 Marching w/out UE support x 10 Heel raises bilat x 20  Stomping x 10 Rt/Lt  Side together/side together  Hip abduction leading with heel x 10 x 2 Rt/Lt Quad set 10 sec x 10 heel resting on foam roll  PT assist for TKE heel on foam roll 10 sec x 3 reps  Patient working on standing with knee extension Step and stop walking fwd/back x 12 ft x 4 reps focus on posture, balance and wt shift  Rt hip abduction leading with heel in Lt sidelying 3 sec x 10  Leg press: DL 19# (using 5# cuff wts x  2 on foot plate to increase  wt)  (P4) 2x 10 Leg press: SL 40# Rt LE x 10    [facility] Adult PT Treatment:                                                DATE: 01/09/2023 Therapeutic Exercise: Recumbent bike L5 x 5 min Resisted side stepping RTB crossed at ankles Resisted fwd zig-zag stepping RTB crossed at ankles AAROM --> AROM heel slides  Knee flexion & extension measurements (see above) Hip & knee MMT Leg press: DL 16# (P4, heels off) 1W96 --> R SL 40# x10    [facility] Adult PT Treatment:                                                DATE: 01/04/2023 Therapeutic Activity: Outdoors: uneven terrain, stairs, slopes: Fwd/bkwd/side walking on even & uneven ground --> fwd/bkwd zig-zag stepping on grass 10#KB suitcase carry -->10#KB farmer carry fwd walking 10#KB purse carry (orange super band) TKE orange superband --> HHA PRN to challenge postural stabiltiy and dynamic balance Walking up/down slopes Stair navigation   PATIENT EDUCATION:  Education details: Walking program --> treadmill use on bad weather days Person educated: Patient Education method: Programmer, multimedia, Demonstration, Actor cues, Verbal cues, and Handouts Education comprehension: verbalized understanding, returned demonstration, verbal cues required, tactile cues required, and needs further education  HOME EXERCISE PROGRAM:  Access Code: [email or web address] URL: [email or web address] Date: 01/12/2023 Prepared by: Corlis Leak  Exercises - Supine Quad Set  - 2 x daily - 7 x weekly - 1 sets - 10 reps - 3 sec  hold - Small Range Straight Leg Raise  - 2 x daily - 7 x weekly - 1 sets - 10 reps - 5 sec  hold - Supine Hip Abduction  - 2 x daily - 7 x weekly - 1 sets - 10 reps - 3 sec  hold - Supine Heel Slide with Strap  - 2 x daily - 7 x weekly - 1 sets - 5-10 reps - 10 sec  hold - Seated Knee Flexion Stretch  - 1 x daily - 7 x weekly - 3 sets - 10 reps - Standing Hip Abduction with Counter Support  - 1 x daily - 7 x weekly - 3  sets - 10 reps - Standing Hip Extension with Counter Support  - 1 x daily - 7 x weekly - 3 sets - 10 reps - Staggered Stance Forward Backward Weight Shift with Counter Support  - 1 x daily - 7 x weekly - 3 sets - 10 reps - Side Stepping with Counter Support  - 2 x daily - 7 x weekly - 1 sets - 3 reps - 30 sec  hold - Mini Squat with Counter Support  - 2 x daily - 7 x weekly - 1-2 sets - 10 reps - 3 sec  hold - Standing Knee Flexion Stretch on Step  - 2 x daily - 7 x weekly - 1 sets - 5-10 reps - 10-20 sec  hold - Standing Marching  - 2 x daily - 7 x weekly - 1-2 sets - 10 reps - Step Up  - 2 x daily - 7 x weekly - 1 sets - 10 reps - 2  sec  hold - Standing Hip Extension with Counter Support  - 2 x daily - 7 x weekly - 1-2 sets - 10 reps - 3-5 sec  hold - Standing Hip Abduction with Counter Support  - 2 x daily - 7 x weekly - 2-3 sets - 10 reps - 2-3 sec  hold - Bridge  - 2 x daily - 7 x weekly - 1-2 sets - 10 reps - 5 sec  hold - Sidelying Hip Abduction  - 1 x daily - 7 x weekly - 3 sets - 10 reps - 3-5 sec  hold - Standing Terminal Knee Extension with Resistance  - 1 x daily - 7 x weekly - 3 sets - 10 reps - 5 sec hold - Wall Squat with Ball between Knees  - 1 x daily - 7 x weekly - 3 sets - 10 reps - Sidelying Hip Abduction  - 1 x daily - 7 x weekly - 3 sets - 10 reps - 3-5 sec  hold  ASSESSMENT:  CLINICAL IMPRESSION: Continued progress with with ROM and strengthening. Needs additional work on Rt hip glut med evidenced by slight Trendelenburg gait pattern with drop of Lt hip in wt bearing Rt LE. Gradually increasing TKE.   OBJECTIVE IMPAIRMENTS: Abnormal gait, decreased activity tolerance, decreased mobility, difficulty walking, decreased ROM, decreased strength, hypomobility, increased edema, increased fascial restrictions, impaired flexibility, improper body mechanics, postural dysfunction, and pain.    GOALS: Goals reviewed with patient? Yes  SHORT TERM GOALS: Target date:  12/22/2022  Patient independent in initial HEP  Baseline: Goal status: MET  2.  Patient independent in all transfers sit >/< stand </> supine  Baseline:  Goal status: MET   LONG TERM GOALS: Target date: 01/19/2023  Increase AROM Rt knee to 0 degrees extension to 120 degrees flexion  Baseline: see above Goal status: IN PROGRESS  2.  4+/5 to 5/5 strength Rt LE Baseline:  Goal status: in progress   3.  Improved gait pattern with patient ambulatory for functional and community distances of 500-700 ft  Baseline:  Goal status: in progress   4.  Patient to report ability to walk dog for 1 mile with appropriate plan to work toward walking 3 miles   Baseline:  Goal status: MET  5.  Independent in all transfers including floor </> chair  Baseline:  Goal status: in progress   6.  Independent in HEP including aquatic exercise program as indicated  Baseline:  Goal status: in progress    PLAN:  PT FREQUENCY: 2-3x/week  PT DURATION: 8 weeks  PLANNED INTERVENTIONS: Therapeutic exercises, Therapeutic activity, Neuromuscular re-education, Balance training, Gait training, Patient/Family education, Self Care, Joint mobilization, Aquatic Therapy, Dry Needling, Electrical stimulation, Cryotherapy, Moist heat, scar mobilization, Taping, Ultrasound, Ionotophoresis 4mg /ml Dexamethasone, Manual therapy, and Re-evaluation  PLAN FOR NEXT SESSION: Progress exercises; gait training; manual work, DN, modalities as indicated   Carlynn Herald, PTA 01/12/2023 2:42 PM

## 2023-01-16 ENCOUNTER — Ambulatory Visit: Payer: Medicare Other

## 2023-01-16 DIAGNOSIS — M25661 Stiffness of right knee, not elsewhere classified: Secondary | ICD-10-CM

## 2023-01-16 DIAGNOSIS — M6281 Muscle weakness (generalized): Secondary | ICD-10-CM

## 2023-01-16 DIAGNOSIS — R600 Localized edema: Secondary | ICD-10-CM

## 2023-01-16 DIAGNOSIS — M25561 Pain in right knee: Secondary | ICD-10-CM | POA: Diagnosis not present

## 2023-01-16 DIAGNOSIS — G8929 Other chronic pain: Secondary | ICD-10-CM

## 2023-01-16 DIAGNOSIS — R262 Difficulty in walking, not elsewhere classified: Secondary | ICD-10-CM

## 2023-01-16 NOTE — Therapy (Signed)
OUTPATIENT PHYSICAL THERAPY LOWER EXTREMITY TREATMENT    Patient Name: Tammy Mills MRN: [id number] DOB:June 26, 1956, 67 y.o., female Today's Date: 01/16/2023  END OF SESSION:  PT End of Session - 01/16/23 1357     Visit Number 15    Number of Visits 20    Date for PT Re-Evaluation 01/19/23    Authorization Type medicare and [employer]    Progress Note Due on Visit 20    PT Start Time 1400    PT Stop Time 1442    PT Time Calculation (min) 42 min    Activity Tolerance Patient tolerated treatment well    Behavior During Therapy WFL for tasks assessed/performed             Past Medical History:  Diagnosis Date   Arthritis    GERD (gastroesophageal reflux disease)    Heart murmur    Mitral valve prolapse    mild to moderate regurgitation   Osteoporosis    Past Surgical History:  Procedure Laterality Date   BREAST EXCISIONAL BIOPSY Right    20 + yrs ago, benign   BREAST SURGERY     COLONOSCOPY     TOTAL KNEE ARTHROPLASTY Left 04/01/2022   Procedure: LEFT TOTAL KNEE ARTHROPLASTY;  Surgeon: Kathryne Hitch, MD;  Location: [facility];  Service: Orthopedics;  Laterality: Left;   TOTAL KNEE ARTHROPLASTY Right 11/25/2022   Procedure: RIGHT TOTAL KNEE ARTHROPLASTY;  Surgeon: Kathryne Hitch, MD;  Location: [facility];  Service: Orthopedics;  Laterality: Right;   WISDOM TOOTH EXTRACTION     Patient Active Problem List   Diagnosis Date Noted   Status post total right knee replacement 11/25/2022   Status post total left knee replacement 04/01/2022   Osteoporosis    Mitral valve prolapse    Osteopenia 05/07/2013   Routine general medical examination at a health care facility 04/30/2013   Right knee pain 05/08/2012   Right hip pain 05/08/2012   Knee pain, bilateral 06/03/2011   WEIGHT GAIN 06/28/2010    PCP: Dr Lynnea Ferrier  REFERRING PROVIDER: Dr Doneen Poisson  REFERRING DIAG: Rt TKA  THERAPY DIAG:  Chronic pain of right knee  Stiffness of right knee, not  elsewhere classified  Muscle weakness (generalized)  Difficulty in walking, not elsewhere classified  Localized edema  Rationale for Evaluation and Treatment: Rehabilitation  ONSET DATE: surgery 11/25/22  SUBJECTIVE:   SUBJECTIVE STATEMENT: Patient reports she walked 2 miles and needed to ice afterwards due to swelling but no pain. Patient states she started wearing orthotic insoles (arch support) to help with pronation.    PERTINENT HISTORY: Arthritis; heart murmur; Lt TKA 7/23 PAIN:  Are you having pain? Yes: NPRS scale: 0/10 Pain location: Rt knee  Pain description: aching Aggravating factors: moving knee; bending knee  Relieving factors: ice; meds  PRECAUTIONS: Post op Total Knee Arthroplasty   WEIGHT BEARING RESTRICTIONS: No  FALLS:  Has patient fallen in last 6 months? 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  No acute fracture. Valgus deformity has progressed since prior films.  Osteochondroma remains  unchanged patella notch area.  Moderate patellofemoral arthritic changes.    EDEMA:  Moderate edema Rt knee and thigh  MUSCLE LENGTH: Hamstrings: Right 65 deg; Left 70 deg Hip flexors: Right tight   POSTURE: rounded shoulders, forward head, and flexed trunk    LOWER EXTREMITY ROM: assessed in supine   Active ROM Right eval Right  12/06/22 Left eval Right 12/12/22 Right 12/29/22  Right 01/09/23   Hip flexion        Hip extension        Hip abduction        Hip adduction        Hip internal rotation        Hip external rotation        Knee flexion 65 95 122 104 112 122  Knee extension -11 -6 0 -4 -5 -3  Ankle dorsiflexion        Ankle plantarflexion        Ankle inversion        Ankle eversion         (Blank rows = not tested)  LOWER EXTREMITY MMT:  MMT Right eval Left eval Right 12/26/22 Right 01/09/23  Hip flexion NT 5 4+ 4+  Hip extension NT 5 4- 4+  Hip abduction NT 5 4 4   Hip adduction NT 5 4 4+  Hip internal rotation      Hip external rotation      Knee flexion NT 5 4+ 5  Knee extension NT 5 4+ 5  Ankle dorsiflexion      Ankle plantarflexion      Ankle inversion      Ankle eversion       (Blank rows = not tested)   FUNCTIONAL TESTS:    GAIT: Distance walked: 120 ft x 2 Assistive device utilized: SPC Level of assistance: SBA  Comments: step through gait pattern with improving weight shift and weight bearing Rt LE    [facility] Adult PT Treatment:                                                DATE: 01/16/2023 Therapeutic Exercise: Recumbent bike L5 x S/L clamshells RTB 2x10 S/L straight leg hip abd RTB above knees 2x10 SLR: parallel, ER, IR x10 each Prone R hip IR Supine R ITB stretch w/strap 3x30" Hip hiking on yoga block (B) Therapeutic Activity: Walking with 10#KB purse carry (orange super band)  Walking with focus on glute activation on R standing leg   [facility] Adult PT Treatment:                                                DATE: 01/12/2023 Therapeutic Exercise: Recumbent bike L3 --> L5 x 7 min Mini-Squat without UE support x 20 Marching w/out UE support x 10 Heel raises bilat x 20  Stomping x 10 Rt/Lt  Side together/side together  Hip abduction leading with heel x 10 x 2 Rt/Lt Quad set 10 sec x 10 heel resting on foam roll  PT assist  for TKE heel on foam roll 10 sec x 3 reps  Patient working on standing with knee  extension Step and stop walking fwd/back x 12 ft x 4 reps focus on posture, balance and wt shift  Rt hip abduction leading with heel in Lt sidelying 3 sec x 10  Leg press: DL 40# (using 5# cuff wts x 2 on foot plate to increase wt)  (P4) 2x 10 Leg press: SL 40# Rt LE x 10     [facility] Adult PT Treatment:                                                DATE: 01/09/2023 Therapeutic Exercise: Recumbent bike L5 x 5 min Resisted side stepping RTB crossed at ankles Resisted fwd zig-zag stepping RTB crossed at ankles AAROM --> AROM heel slides  Knee flexion & extension measurements (see above) Hip & knee MMT Leg press: DL 98# (P4, heels off) 1X91 --> R SL 40# x10   PATIENT EDUCATION:  Education details: Updated HEP Person educated: Patient Education method: Explanation, Demonstration, Tactile cues, Verbal cues, and Handouts Education comprehension: verbalized understanding, returned demonstration, verbal cues required, tactile cues required, and needs further education  HOME EXERCISE PROGRAM:  Access Code: [email or web address] URL: [email or web address] Date: 01/16/2023 Prepared by: Carlynn Herald  Exercises - Supine Quad Set  - 2 x daily - 7 x weekly - 1 sets - 10 reps - 3 sec  hold - Small Range Straight Leg Raise  - 2 x daily - 7 x weekly - 1 sets - 10 reps - 5 sec  hold - Supine Hip Abduction  - 2 x daily - 7 x weekly - 1 sets - 10 reps - 3 sec  hold - Supine Heel Slide with Strap  - 2 x daily - 7 x weekly - 1 sets - 5-10 reps - 10 sec  hold - Seated Knee Flexion Stretch  - 1 x daily - 7 x weekly - 3 sets - 10 reps - Standing Hip Abduction with Counter Support  - 1 x daily - 7 x weekly - 3 sets - 10 reps - Standing Hip Extension with Counter Support  - 1 x daily - 7 x weekly - 3 sets - 10 reps - Staggered Stance Forward Backward Weight Shift with Counter Support  - 1 x daily - 7 x weekly - 3 sets - 10 reps - Side Stepping with Counter Support  - 2 x daily - 7 x weekly - 1 sets - 3  reps - 30 sec  hold - Mini Squat with Counter Support  - 2 x daily - 7 x weekly - 1-2 sets - 10 reps - 3 sec  hold - Standing Knee Flexion Stretch on Step  - 2 x daily - 7 x weekly - 1 sets - 5-10 reps - 10-20 sec  hold - Standing Marching  - 2 x daily - 7 x weekly - 1-2 sets - 10 reps - Step Up  - 2 x daily - 7 x weekly - 1 sets - 10 reps - 2 sec  hold - Standing Hip Extension with Counter Support  - 2 x daily - 7 x weekly - 1-2 sets - 10 reps - 3-5 sec  hold - Standing Hip Abduction with Counter Support  - 2 x daily - 7 x weekly -  2-3 sets - 10 reps - 2-3 sec  hold - Bridge  - 2 x daily - 7 x weekly - 1-2 sets - 10 reps - 5 sec  hold - Standing Terminal Knee Extension with Resistance  - 1 x daily - 7 x weekly - 3 sets - 10 reps - 5 sec hold - Wall Squat with Ball between Knees  - 1 x daily - 7 x weekly - 3 sets - 10 reps - Sidelying Hip Abduction  - 1 x daily - 7 x weekly - 3 sets - 10 reps - 3-5 sec  hold - Clam with Resistance  - 1 x daily - 7 x weekly - 3 sets - 10 reps - Sidelying Hip Abduction with Resistance at Thighs  - 1 x daily - 7 x weekly - 3 sets - 10 reps - Prone Hip Internal Rotation AROM  - 1 x daily - 7 x weekly - 3 sets - 10 reps - Standing Hip Hiking  - 1 x daily - 7 x weekly - 3 sets - 10 reps  ASSESSMENT:  CLINICAL IMPRESSION: Weakness noted in R hip with straight leg raises with internal rotation. Cueing glute activation in stnace phase on R LE during walking improved overall stability. Weakness noted in R hip/glute during hip hikng exercise.    OBJECTIVE IMPAIRMENTS: Abnormal gait, decreased activity tolerance, decreased mobility, difficulty walking, decreased ROM, decreased strength, hypomobility, increased edema, increased fascial restrictions, impaired flexibility, improper body mechanics, postural dysfunction, and pain.    GOALS: Goals reviewed with patient? Yes  SHORT TERM GOALS: Target date: 12/22/2022  Patient independent in initial HEP  Baseline: Goal  status: MET  2.  Patient independent in all transfers sit >/< stand </> supine  Baseline:  Goal status: MET   LONG TERM GOALS: Target date: 01/19/2023  Increase AROM Rt knee to 0 degrees extension to 120 degrees flexion  Baseline: see above Goal status: IN PROGRESS  2.  4+/5 to 5/5 strength Rt LE Baseline:  Goal status: in progress   3.  Improved gait pattern with patient ambulatory for functional and community distances of 500-700 ft  Baseline:  Goal status: in progress   4.  Patient to report ability to walk dog for 1 mile with appropriate plan to work toward walking 3 miles   Baseline:  Goal status: IN PROGRESS  5.  Independent in all transfers including floor </> chair  Baseline:  Goal status: in progress   6.  Independent in HEP including aquatic exercise program as indicated  Goal status: MET    PLAN:  PT FREQUENCY: 2-3x/week  PT DURATION: 8 weeks  PLANNED INTERVENTIONS: Therapeutic exercises, Therapeutic activity, Neuromuscular re-education, Balance training, Gait training, Patient/Family education, Self Care, Joint mobilization, Aquatic Therapy, Dry Needling, Electrical stimulation, Cryotherapy, Moist heat, scar mobilization, Taping, Ultrasound, Ionotophoresis 4mg /ml Dexamethasone, Manual therapy, and Re-evaluation  PLAN FOR NEXT SESSION: Hip hiking, glute/hip strengthening. Progress exercises; gait training; manual work, DN, modalities as indicated   Carlynn Herald, PTA 01/16/2023 2:44 PM

## 2023-01-19 ENCOUNTER — Ambulatory Visit: Payer: Medicare Other

## 2023-01-19 DIAGNOSIS — R600 Localized edema: Secondary | ICD-10-CM

## 2023-01-19 DIAGNOSIS — M6281 Muscle weakness (generalized): Secondary | ICD-10-CM

## 2023-01-19 DIAGNOSIS — R262 Difficulty in walking, not elsewhere classified: Secondary | ICD-10-CM

## 2023-01-19 DIAGNOSIS — G8929 Other chronic pain: Secondary | ICD-10-CM

## 2023-01-19 DIAGNOSIS — M25661 Stiffness of right knee, not elsewhere classified: Secondary | ICD-10-CM

## 2023-01-19 DIAGNOSIS — M25561 Pain in right knee: Secondary | ICD-10-CM | POA: Diagnosis not present

## 2023-01-19 NOTE — Therapy (Addendum)
 OUTPATIENT PHYSICAL THERAPY LOWER EXTREMITY TREATMENT  PHYSICAL THERAPY DISCHARGE SUMMARY  Visits from Start of Care: 16  Current functional level related to goals / functional outcomes: See progress note for discharge status    Remaining deficits: Needs to continue stretching and strengthening LE   Education / Equipment: HEP    Patient agrees to discharge. Patient goals were met. Patient is being discharged due to meeting the stated rehab goals.  Celyn P. Leonor Liv PT, MPH 11/13/23 8:05 AM     Patient Name: Tammy Mills MRN: [id number] DOB:Oct 22, 1955, 67 y.o., female Today's Date: 01/19/2023  END OF SESSION:  PT End of Session - 01/19/23 1445     Visit Number 16    Number of Visits 20    Date for PT Re-Evaluation 01/19/23    Authorization Type medicare and [employer]    Progress Note Due on Visit 20    PT Start Time 1445    PT Stop Time 1530    PT Time Calculation (min) 45 min    Activity Tolerance Patient tolerated treatment well    Behavior During Therapy WFL for tasks assessed/performed             Past Medical History:  Diagnosis Date   Arthritis    GERD (gastroesophageal reflux disease)    Heart murmur    Mitral valve prolapse    mild to moderate regurgitation   Osteoporosis    Past Surgical History:  Procedure Laterality Date   BREAST EXCISIONAL BIOPSY Right    20 + yrs ago, benign   BREAST SURGERY     COLONOSCOPY     TOTAL KNEE ARTHROPLASTY Left 04/01/2022   Procedure: LEFT TOTAL KNEE ARTHROPLASTY;  Surgeon: Kathryne Hitch, MD;  Location: [facility];  Service: Orthopedics;  Laterality: Left;   TOTAL KNEE ARTHROPLASTY Right 11/25/2022   Procedure: RIGHT TOTAL KNEE ARTHROPLASTY;  Surgeon: Kathryne Hitch, MD;  Location: [facility];  Service: Orthopedics;  Laterality: Right;   WISDOM TOOTH EXTRACTION     Patient Active Problem List   Diagnosis Date Noted   Status post total right knee replacement 11/25/2022   Status post total left knee replacement  04/01/2022   Osteoporosis    Mitral valve prolapse    Osteopenia 05/07/2013   Routine general medical examination at a health care facility 04/30/2013   Right knee pain 05/08/2012   Right hip pain 05/08/2012   Knee pain, bilateral 06/03/2011   WEIGHT GAIN 06/28/2010    PCP: Dr Lynnea Ferrier  REFERRING PROVIDER: Dr Doneen Poisson  REFERRING DIAG: Rt TKA  THERAPY DIAG:  Chronic pain of right knee  Stiffness of right knee, not elsewhere classified  Muscle weakness (generalized)  Difficulty in walking, not elsewhere classified  Localized edema  Rationale for Evaluation and Treatment: Rehabilitation  ONSET DATE: surgery 11/25/22  SUBJECTIVE:   SUBJECTIVE STATEMENT: Patient reports she stopped using SPC as of yesterday. Patient states she wants feel stronger before taking her dog for a walk.   PERTINENT HISTORY: Arthritis; heart murmur; Lt TKA 7/23 PAIN:  Are you having pain? Yes: NPRS scale: 0/10 Pain location: Rt knee  Pain description: aching Aggravating factors: moving knee; bending knee  Relieving factors: ice; meds  PRECAUTIONS: Post op Total Knee Arthroplasty   WEIGHT BEARING RESTRICTIONS: No  FALLS:  Has patient fallen in last 6 months? 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  No acute fracture. Valgus deformity has progressed since prior films.  Osteochondroma remains   unchanged patella notch area.  Moderate patellofemoral arthritic changes.    EDEMA:  Moderate edema Rt knee and thigh  MUSCLE LENGTH: Hamstrings: Right 65 deg; Left 70 deg Hip flexors: Right tight   POSTURE: rounded shoulders, forward head, and flexed trunk    LOWER EXTREMITY ROM: assessed in supine   Active ROM Right eval Right  12/06/22 Left eval Right 12/12/22 Right 12/29/22 Right 01/09/23  Right 01/19/23  Hip flexion         Hip extension         Hip abduction         Hip adduction         Hip internal rotation         Hip external rotation         Knee flexion 65 95 122 104 112 122 124  Knee extension -11 -6 0 -4 -5 -3 0  Ankle dorsiflexion         Ankle plantarflexion         Ankle inversion         Ankle eversion          (Blank rows = not tested)  LOWER EXTREMITY MMT:  MMT Right eval Left eval Right 12/26/22 Right 01/09/23 Right 01/19/23  Hip flexion NT 5 4+ 4+ 4+  Hip extension NT 5 4- 4+ 4+  Hip abduction NT 5 4 4  4+  Hip adduction NT 5 4 4+ 4+  Hip internal rotation       Hip external rotation       Knee flexion NT 5 4+ 5 5  Knee extension NT 5 4+ 5 5  Ankle dorsiflexion       Ankle plantarflexion       Ankle inversion       Ankle eversion        (Blank rows = not tested)   FUNCTIONAL TESTS:    GAIT: Distance walked: 120 ft x 2 Assistive device utilized: SPC Level of assistance: SBA  Comments: step through gait pattern with improving weight shift and weight bearing Rt LE    [facility] Adult PT Treatment:                                                DATE: 01/19/2023 Therapeutic Exercise: NuStep L7 x 5 min Knee flex/ext measurements Hip/knee MMT Review of LTG Seated captain morgan Hip hikes on 4" step    [facility] Adult PT Treatment:                                                DATE: 01/16/2023 Therapeutic Exercise:  Recumbent bike L5 x S/L clamshells RTB 2x10 S/L straight leg hip abd RTB above knees 2x10 SLR: parallel, ER, IR x10  each Prone R hip IR Supine R ITB stretch w/strap 3x30" Hip hiking on yoga block (B) Therapeutic Activity: Walking with 10#KB purse carry (orange super band)  Walking with focus on glute activation on R standing leg   [facility] Adult PT Treatment:                                                DATE: 01/12/2023 Therapeutic Exercise: Recumbent bike L3 --> L5 x 7 min Mini-Squat without UE support x 20 Marching w/out UE support x 10 Heel raises bilat x 20  Stomping x 10 Rt/Lt  Side together/side together  Hip abduction leading with heel x 10 x 2 Rt/Lt Quad set 10 sec x 10 heel resting on foam roll  PT assist for TKE heel on foam roll 10 sec x 3 reps  Patient working on standing with knee extension Step and stop walking fwd/back x 12 ft x 4 reps focus on posture, balance and wt shift  Rt hip abduction leading with heel in Lt sidelying 3 sec x 10  Leg press: DL 40# (using 5# cuff wts x 2 on foot plate to increase wt)  (P4) 2x 10 Leg press: SL 40# Rt LE x 10    PATIENT EDUCATION:  Education details: Updated HEP Person educated: Patient Education method: Explanation, Demonstration, Tactile cues, Verbal cues, and Handouts Education comprehension: verbalized understanding, returned demonstration, verbal cues required, tactile cues required, and needs further education  HOME EXERCISE PROGRAM: Access Code: [email or web address] URL: [email or web address] Date: 01/19/2023 Prepared by: Carlynn Herald  Exercises - Standing Marching  - 2 x daily - 7 x weekly - 1-2 sets - 10 reps - Step Up  - 2 x daily - 7 x weekly - 1 sets - 10 reps - 2 sec  hold - Bridge  - 2 x daily - 7 x weekly - 1-2 sets - 10 reps - 5 sec  hold - Standing Terminal Knee Extension with Resistance  - 1 x daily - 7 x weekly - 3 sets - 10 reps - 5 sec hold - Wall Squat with Ball between Knees  - 1 x daily - 7 x weekly - 3 sets - 10 reps - Clam with Resistance  - 1 x daily - 7 x weekly - 3 sets - 10 reps - Sidelying Hip Abduction  with Resistance at Thighs  - 1 x daily - 7 x weekly - 3 sets - 10 reps - Glute Med Wall Lean  - 1 x daily - 7 x weekly - 3 sets - 10 reps - Prone Hip Extension with Resistance Loop  - 1 x daily - 7 x weekly - 3 sets - 10 reps - Hip Hiking on Step  - 1 x daily - 7 x weekly - 3 sets - 10 reps  ASSESSMENT:  CLINICAL IMPRESSION: Good progression demonstrated with knee flexion and extension AROM, as well as hip and knee strength as measured by MMT. Good endurance exhibited during long distance walking; continued mild trendelenburg gait noted. HEP updated with focus on glute and hip strengthening to address gait pattern.   OBJECTIVE IMPAIRMENTS: Abnormal gait, decreased activity tolerance, decreased mobility, difficulty walking, decreased ROM, decreased strength, hypomobility, increased edema,  increased fascial restrictions, impaired flexibility, improper body mechanics, postural dysfunction, and pain.    GOALS: Goals reviewed with patient? Yes  SHORT TERM GOALS: Target date: 12/22/2022  Patient independent in initial HEP  Baseline: Goal status: MET  2.  Patient independent in all transfers sit >/< stand </> supine  Baseline:  Goal status: MET   LONG TERM GOALS: Target date: 01/19/2023  Increase AROM Rt knee to 0 degrees extension to 120 degrees flexion  Baseline: see above Goal status: MET  2.  4+/5 to 5/5 strength Rt LE Baseline: see above Goal status: MET   3.  Improved gait pattern with patient ambulatory for functional and community distances of 500-700 ft  Baseline: mild trendelenburg gait Goal status: in progress (distance met)  4.  Patient to report ability to walk dog for 1 mile with appropriate plan to work toward walking 3 miles   Baseline: 2 miles (waiting to walk dog until feeling more confident) Goal status: MET  5.  Independent in all transfers including floor </> chair  Baseline: independent Goal status: MET   6.  Independent in HEP including aquatic exercise  program as indicated  Goal status: MET    PLAN:  PT FREQUENCY: 2-3x/week  PT DURATION: 8 weeks  PLANNED INTERVENTIONS: Therapeutic exercises, Therapeutic activity, Neuromuscular re-education, Balance training, Gait training, Patient/Family education, Self Care, Joint mobilization, Aquatic Therapy, Dry Needling, Electrical stimulation, Cryotherapy, Moist heat, scar mobilization, Taping, Ultrasound, Ionotophoresis 4mg /ml Dexamethasone, Manual therapy, and Re-evaluation  PLAN FOR NEXT SESSION: Hip hiking, glute/hip strengthening. Progress exercises; gait training; manual work, DN, modalities as indicated   Carlynn Herald, PTA 01/19/2023 3:33 PM

## 2023-02-08 ENCOUNTER — Ambulatory Visit (INDEPENDENT_AMBULATORY_CARE_PROVIDER_SITE_OTHER): Payer: Medicare Other | Admitting: Orthopaedic Surgery

## 2023-02-08 ENCOUNTER — Encounter: Payer: Self-pay | Admitting: Orthopaedic Surgery

## 2023-02-08 DIAGNOSIS — Z96651 Presence of right artificial knee joint: Secondary | ICD-10-CM

## 2023-02-08 NOTE — Progress Notes (Signed)
The patient is at least 10 weeks status post a right total knee arthroplasty.  We replaced her left knee in July of last year.  She has a little bit of a Trendelenburg gait the physical therapy is working with on her.  She is 67 years old and will be 67 soon.  Overall she is doing better.  On exam her right operative knee is still swollen to be expected but overall it feels stable ligamentously and has excellent range of motion.  Both hips move smoothly and fluidly and her left knee seems be doing well.  I did watch her walk and she does have a slight Trendelenburg gait and a limp.  Hopefully this will improve with therapy and time.  It should from my standpoint.  Will see her back in 3 months to see how she is doing overall.  I would like a standing AP and lateral of both knees at that visit.

## 2023-02-22 ENCOUNTER — Other Ambulatory Visit: Payer: Self-pay

## 2023-02-22 DIAGNOSIS — M810 Age-related osteoporosis without current pathological fracture: Secondary | ICD-10-CM

## 2023-02-24 ENCOUNTER — Telehealth: Payer: Self-pay

## 2023-02-24 NOTE — Progress Notes (Signed)
   Care Guide Note  02/24/2023 Name: Tammy Mills MRN: [id number] DOB: 1955/12/04  Referred by: Donita Brooks, MD Reason for referral : Care Coordination (Outreach to schedule with pharm d )   Tammy Mills is a 67 y.o. year old female who is a primary care patient of Tanya Nones, Priscille Heidelberg, MD. Tammy Mills was referred to the pharmacist for assistance related to  osteoperosis .    Successful contact was made with the patient to discuss pharmacy services including being ready for the pharmacist to call at least 5 minutes before the scheduled appointment time, to have medication bottles and any blood sugar or blood pressure readings ready for review. The patient agreed to meet with the pharmacist via with the pharmacist via telephone visit on (date/time).  03/15/2023  Penne Lash, RMA Care Guide [facility]  [place], [place] [address] Direct Dial: [phone] [email or web address]

## 2023-03-03 ENCOUNTER — Other Ambulatory Visit: Payer: Self-pay | Admitting: Orthopaedic Surgery

## 2023-03-15 ENCOUNTER — Other Ambulatory Visit: Payer: Medicare Other | Admitting: Pharmacist

## 2023-03-27 ENCOUNTER — Other Ambulatory Visit: Payer: Self-pay

## 2023-03-27 DIAGNOSIS — M810 Age-related osteoporosis without current pathological fracture: Secondary | ICD-10-CM

## 2023-04-03 ENCOUNTER — Encounter: Payer: Self-pay | Admitting: Family Medicine

## 2023-04-04 ENCOUNTER — Other Ambulatory Visit: Payer: Self-pay

## 2023-04-04 DIAGNOSIS — M810 Age-related osteoporosis without current pathological fracture: Secondary | ICD-10-CM

## 2023-04-07 NOTE — Progress Notes (Signed)
   03/15/2023 Name: Tammy Mills MRN: [id number] DOB: [birth year]  Chief Complaint  Patient presents with   Osteoporosis   Prolia pharmacy referral   Last prolia injection around December Patient has been on Prolia for about 2 years Sent message to PCP nurse re: Prolia to assist with Prolia approval and retrieval on 03/15/23     Please follow up to order and schedule patient   Kieth Brightly, PharmD, BCACP Clinical Pharmacist, [facility]

## 2023-04-24 ENCOUNTER — Encounter: Payer: Self-pay | Admitting: Physician Assistant

## 2023-04-24 ENCOUNTER — Ambulatory Visit (INDEPENDENT_AMBULATORY_CARE_PROVIDER_SITE_OTHER): Payer: Medicare Other | Admitting: Physician Assistant

## 2023-04-24 DIAGNOSIS — L089 Local infection of the skin and subcutaneous tissue, unspecified: Secondary | ICD-10-CM | POA: Diagnosis not present

## 2023-04-24 DIAGNOSIS — Z96652 Presence of left artificial knee joint: Secondary | ICD-10-CM | POA: Diagnosis not present

## 2023-04-24 DIAGNOSIS — S80252A Superficial foreign body, left knee, initial encounter: Secondary | ICD-10-CM | POA: Diagnosis not present

## 2023-04-24 MED ORDER — DOXYCYCLINE HYCLATE 100 MG PO TABS: 100.0000 mg | ORAL_TABLET | Freq: Two times a day (BID) | ORAL | 0 refills | Status: AC

## 2023-04-24 NOTE — Progress Notes (Signed)
HPI: [name] returns today due to redness at the top of her incisions.  She states she had some drainage coming out of the incision over the weekend.  No known injury.  She does wear compression hose when walking and states that does rub this area of the proximal incision.  History of left total knee arthroplasty 04/01/2022.  She denies any fevers chills.  No other ongoing infections.  Review of systems see HPI otherwise negative  Physical exam: General Well-developed well-nourished female who ambulates without any assistive devices nonantalgic gait. Bilateral knees full range of motion without pain.  No instability valgus varus stressing of either knee.  No effusion abnormal warmth or erythema of right knee.  Left knee small area of erythema on proximal incision.  Able to express small amount of purulence.  The area was numbed with lidocaine after Betadine prep 10 blade was used to make an incision over the proximal incision area.  Fibrinous tissue was removed.  Small amount of purulence.  No malodor.  Impression: Left knee superficial infection   Plan: Knee was dressed using gauze ABD pad and Ace wrap.  She will leave this on until tomorrow.  Then she will wash the wound with an antibacterial soap.  Apply a small amount of mupirocin which was supplied.  She is also to pick up doxycycline which she will begin taking immediately.  Will see her back in 1 week to see how she is doing overall sooner if there is any questions concerns.  Questions were encouraged and answered by Dr. Magnus Ivan and myself.

## 2023-04-26 ENCOUNTER — Ambulatory Visit (INDEPENDENT_AMBULATORY_CARE_PROVIDER_SITE_OTHER): Payer: Medicare Other

## 2023-04-26 DIAGNOSIS — M810 Age-related osteoporosis without current pathological fracture: Secondary | ICD-10-CM

## 2023-04-26 MED ORDER — DENOSUMAB 60 MG/ML ~~LOC~~ SOSY
60.0000 mg | PREFILLED_SYRINGE | Freq: Once | SUBCUTANEOUS | Status: AC
Start: 2023-04-26 — End: 2023-04-26
  Administered 2023-04-26: 60 mg via SUBCUTANEOUS

## 2023-04-26 NOTE — Progress Notes (Signed)
Pt was given her prolia injection subQ on the L-upper arm. Pt tol well no c/o and left ambulatory

## 2023-05-01 ENCOUNTER — Ambulatory Visit (INDEPENDENT_AMBULATORY_CARE_PROVIDER_SITE_OTHER): Payer: Medicare Other | Admitting: Physician Assistant

## 2023-05-01 ENCOUNTER — Encounter: Payer: Self-pay | Admitting: Physician Assistant

## 2023-05-01 ENCOUNTER — Other Ambulatory Visit: Payer: Self-pay | Admitting: Orthopaedic Surgery

## 2023-05-01 DIAGNOSIS — S80252A Superficial foreign body, left knee, initial encounter: Secondary | ICD-10-CM | POA: Diagnosis not present

## 2023-05-01 DIAGNOSIS — L089 Local infection of the skin and subcutaneous tissue, unspecified: Secondary | ICD-10-CM | POA: Diagnosis not present

## 2023-05-01 NOTE — Progress Notes (Signed)
HPI: Tammy Mills comes in today for follow-up left knee.  She states that she is still taking doxycycline.  She has no concerns.  She is applying mupirocin after washing the incision daily.  Given her history of left total knee arthroplasty 04/01/2022.  She is also status post right total knee arthroplasty 11/25/2022.  She is due to see [place] back in early September for x-rays of the right knee for 45-month postop visit.  Physical exam: General: Well-developed well-nourished female no acute distress no assistive device. Left knee: Full extension / full flexion.  Proximal incision scabbed over.  There is no evidence of drainage.  Minimal erythema and slight redness noted.  Impression: Left knee superficial infection  Plan: She will continue current care with daily cleansing and mupirocin.  She will finish her doxycycline.  Will see her back early September for follow-up of her right total knee arthroplasty.  Questions were encouraged and answered.

## 2023-05-11 ENCOUNTER — Telehealth: Payer: Self-pay | Admitting: *Deleted

## 2023-05-11 ENCOUNTER — Other Ambulatory Visit (INDEPENDENT_AMBULATORY_CARE_PROVIDER_SITE_OTHER): Payer: Medicare Other

## 2023-05-11 ENCOUNTER — Ambulatory Visit (INDEPENDENT_AMBULATORY_CARE_PROVIDER_SITE_OTHER): Payer: Medicare Other | Admitting: Orthopaedic Surgery

## 2023-05-11 ENCOUNTER — Encounter: Payer: Self-pay | Admitting: Orthopaedic Surgery

## 2023-05-11 DIAGNOSIS — Z96653 Presence of artificial knee joint, bilateral: Secondary | ICD-10-CM

## 2023-05-11 DIAGNOSIS — Z96651 Presence of right artificial knee joint: Secondary | ICD-10-CM | POA: Diagnosis not present

## 2023-05-11 DIAGNOSIS — Z96652 Presence of left artificial knee joint: Secondary | ICD-10-CM | POA: Diagnosis not present

## 2023-05-11 NOTE — Progress Notes (Signed)
The patient comes in today for follow-up as it relates to having both of her knees replaced.  Her left knee was replaced in July 2023 and her right knee in March 2024.  She is an active 67 year old female.  Prior to surgery of both knees had severe valgus malalignment.  She says both her knees are straight now.  On exam both knees are straight.  Both knees have excellent range of motion and are ligamentously stable.  An AP and lateral of both knees show well-seated total knee arthroplasties with no complicating features.  At this point follow-up for her knees can be as needed.  If she does develop any issues at all she knows to let us know.  All questions and concerns were addressed and answered.

## 2023-05-11 NOTE — Telephone Encounter (Signed)
In office meeting completed.

## 2023-05-18 ENCOUNTER — Ambulatory Visit: Payer: Medicare Other | Admitting: Family Medicine

## 2023-05-19 ENCOUNTER — Ambulatory Visit
Admission: RE | Admit: 2023-05-19 | Discharge: 2023-05-19 | Disposition: A | Discharge status: Home / Self Care | Payer: Medicare Other | Source: Ambulatory Visit | Attending: Family Medicine | Admitting: Family Medicine

## 2023-05-19 DIAGNOSIS — M810 Age-related osteoporosis without current pathological fracture: Secondary | ICD-10-CM

## 2023-06-12 ENCOUNTER — Other Ambulatory Visit (HOSPITAL_BASED_OUTPATIENT_CLINIC_OR_DEPARTMENT_OTHER): Payer: Self-pay

## 2023-06-12 MED ORDER — COMIRNATY 30 MCG/0.3ML IM SUSY
PREFILLED_SYRINGE | INTRAMUSCULAR | 0 refills | Status: AC
  Filled 2023-06-12: qty 0.3, 1d supply, fill #0

## 2023-06-12 MED ORDER — FLUAD 0.5 ML IM SUSY
PREFILLED_SYRINGE | INTRAMUSCULAR | 0 refills | Status: AC
  Filled 2023-06-12: qty 0.5, 1d supply, fill #0

## 2023-06-25 ENCOUNTER — Other Ambulatory Visit: Payer: Self-pay | Admitting: Physician Assistant

## 2023-07-11 ENCOUNTER — Encounter: Payer: Self-pay | Admitting: Internal Medicine

## 2023-07-19 ENCOUNTER — Encounter: Payer: Self-pay | Admitting: Family Medicine

## 2023-07-20 ENCOUNTER — Other Ambulatory Visit: Payer: Self-pay

## 2023-07-20 DIAGNOSIS — Z1211 Encounter for screening for malignant neoplasm of colon: Secondary | ICD-10-CM

## 2023-08-12 LAB — COLOGUARD: COLOGUARD: NEGATIVE

## 2023-08-14 IMAGING — MG MM DIGITAL SCREENING BILAT W/ TOMO AND CAD
8 series · 9 of 24 positions shown · non-contrast
Comparison: Previous exam(s).

CLINICAL DATA: Screening.

EXAM:
DIGITAL SCREENING BILATERAL MAMMOGRAM WITH TOMOSYNTHESIS AND CAD
TECHNIQUE: Bilateral screening digital craniocaudal and mediolateral oblique
mammograms were obtained. Bilateral screening digital breast
tomosynthesis was performed. The images were evaluated with
computer-aided detection.

[L CC synth-2D]
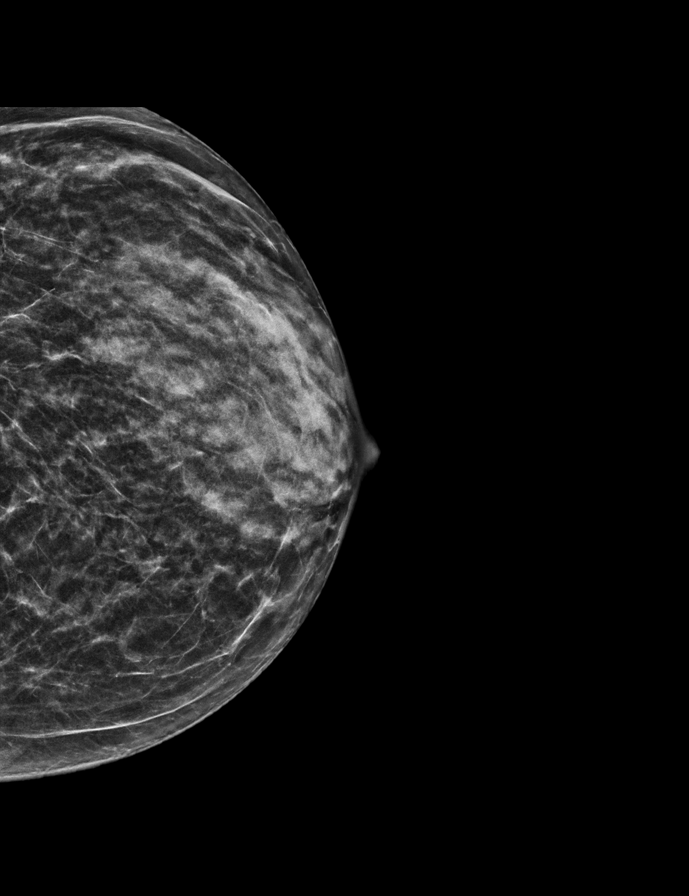

[R CC synth-2D]
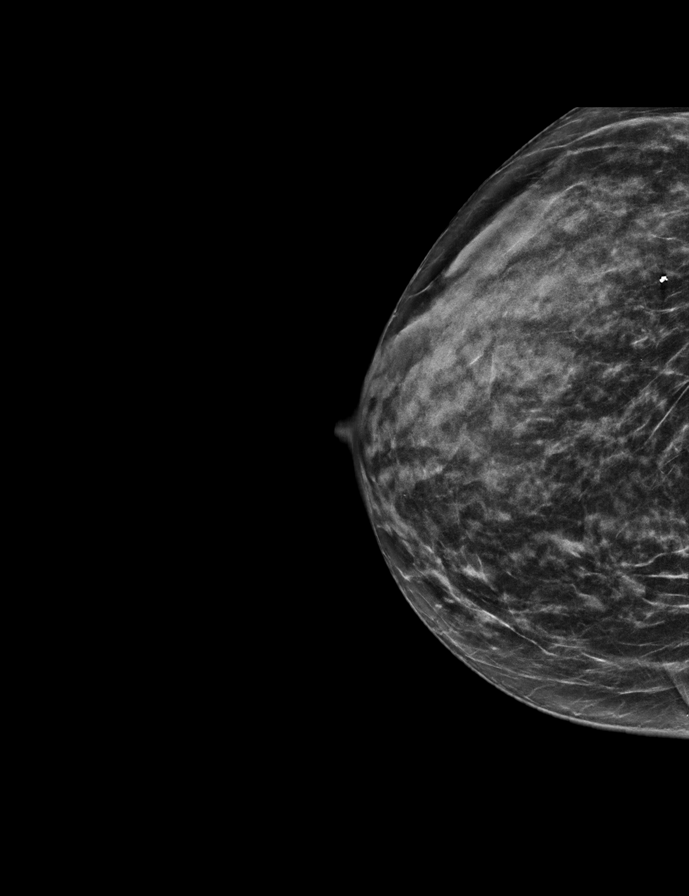

[L MLO synth-2D]
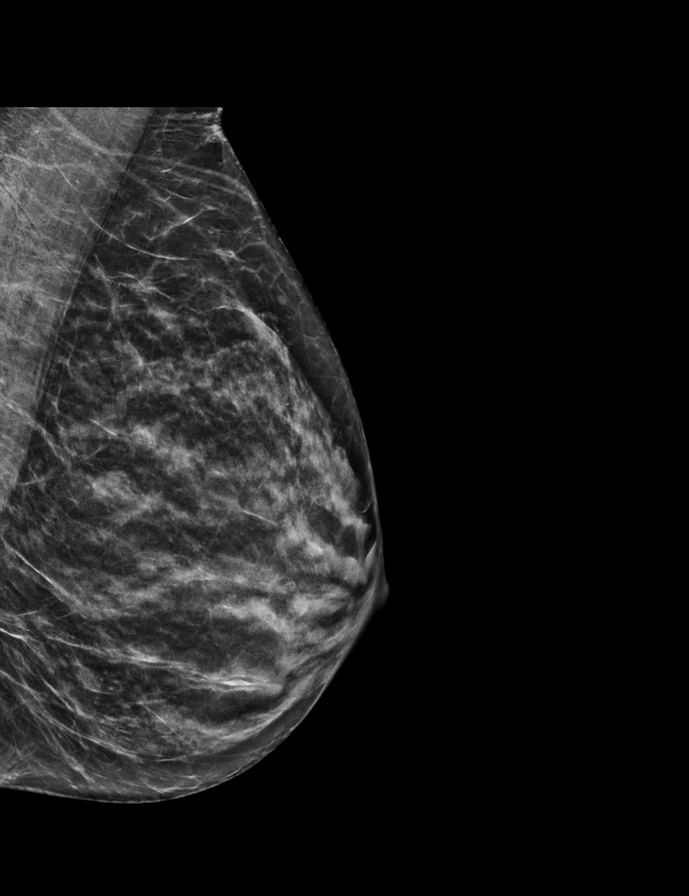

[R MLO synth-2D]
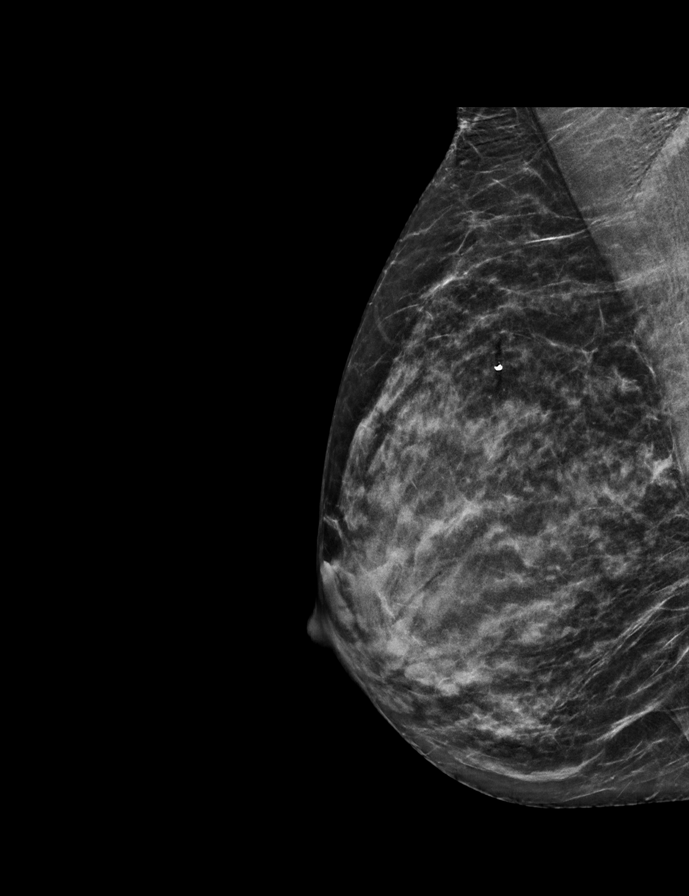

[L MLO tomo · 2 of 52 frames shown]
[frame 17/52]
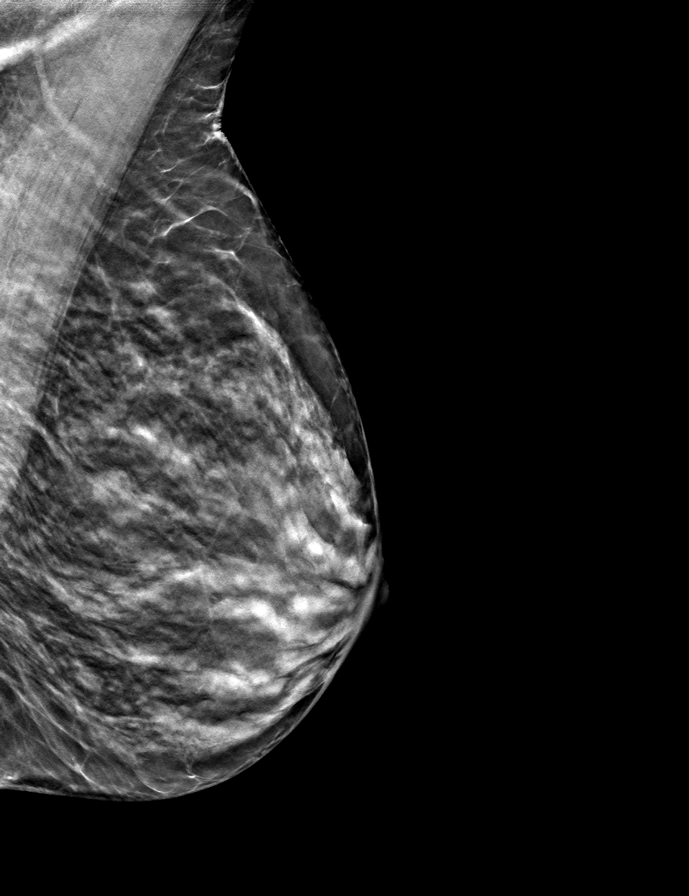
[frame 27/52]
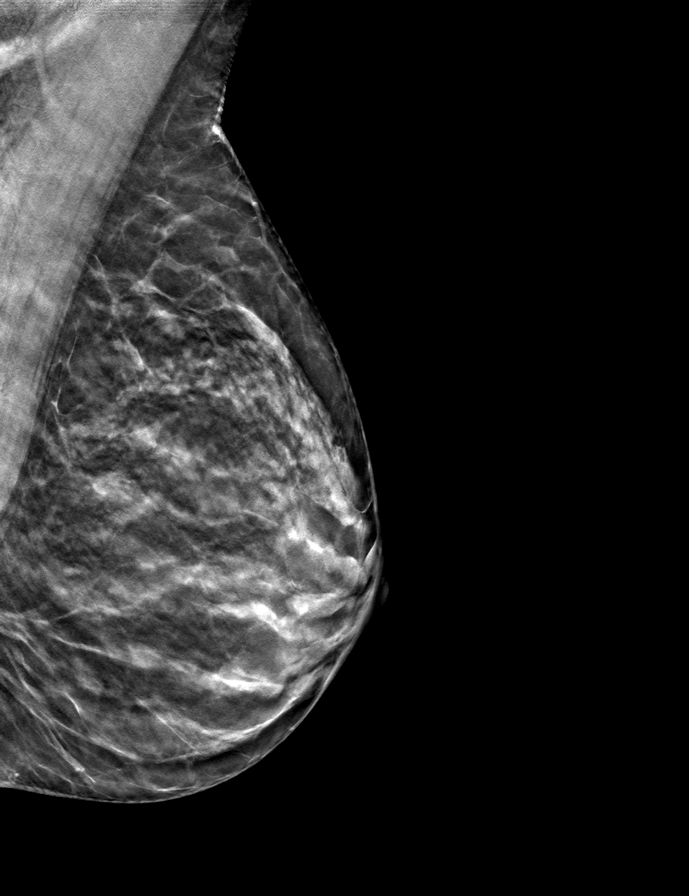

[R MLO tomo · tomo slice 27/52.0]
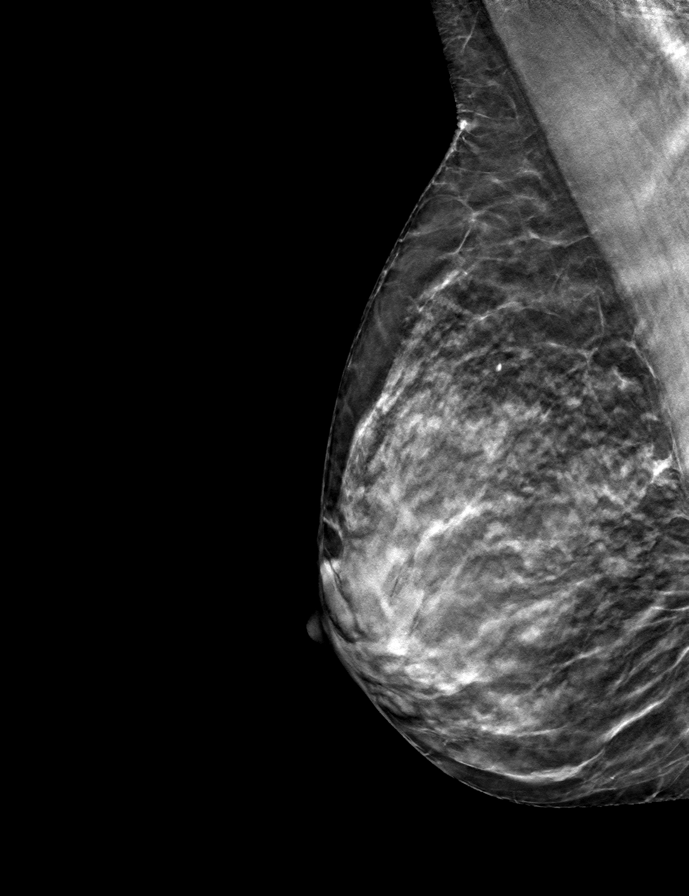

[L CC tomo · tomo slice 24/47.0]
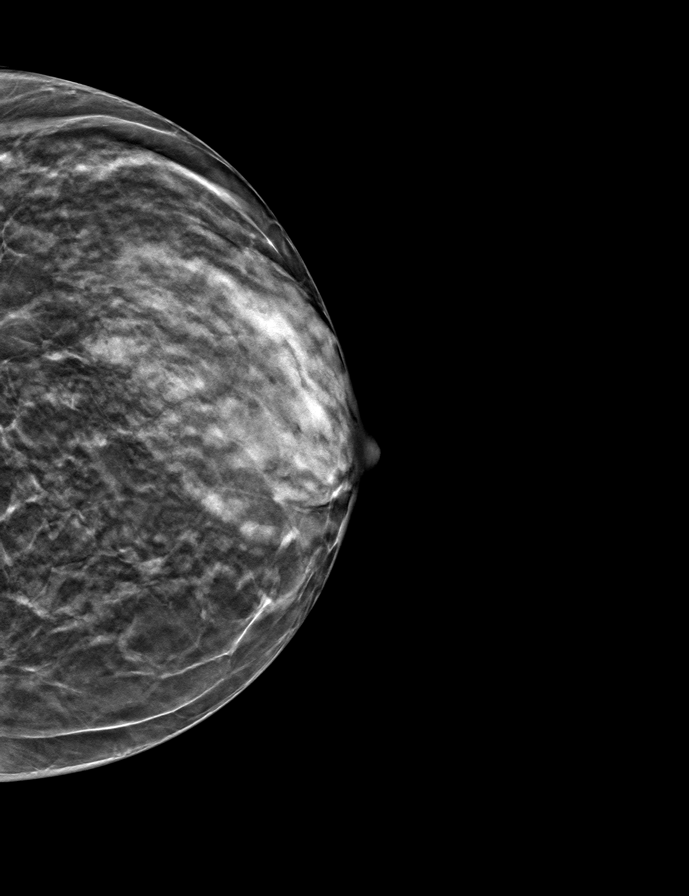

[R CC tomo · tomo slice 27/53.0]
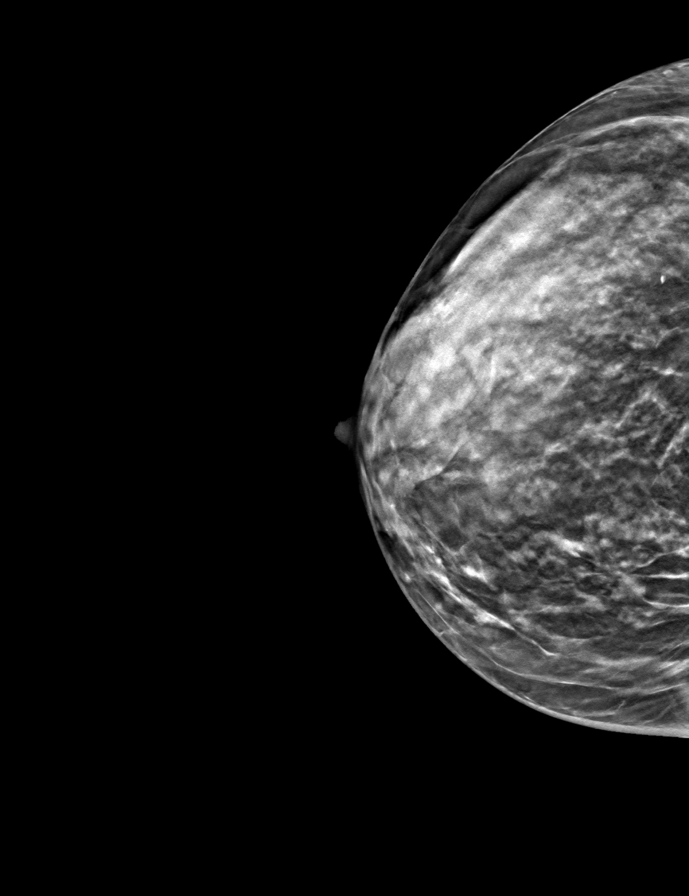

[9 of 24 positions shown; findings below may reference images not displayed]

ACR Breast Density Category c: The breast tissue is heterogeneously
dense, which may obscure small masses.
FINDINGS: There are no findings suspicious for malignancy.
IMPRESSION: No mammographic evidence of malignancy. A result letter of this
screening mammogram will be mailed directly to the patient.

RECOMMENDATION:
Screening mammogram in one year. (Code:Q3-W-BC3)

BI-RADS CATEGORY  1: Negative.

## 2023-08-24 ENCOUNTER — Other Ambulatory Visit: Payer: Self-pay | Admitting: Orthopaedic Surgery

## 2023-09-26 ENCOUNTER — Encounter: Payer: Self-pay | Admitting: Family Medicine

## 2023-10-22 ENCOUNTER — Other Ambulatory Visit: Payer: Self-pay | Admitting: Orthopaedic Surgery

## 2023-10-27 ENCOUNTER — Ambulatory Visit: Payer: Medicare Other | Admitting: Family Medicine

## 2023-10-31 ENCOUNTER — Ambulatory Visit: Payer: Medicare Other

## 2023-11-07 ENCOUNTER — Ambulatory Visit (INDEPENDENT_AMBULATORY_CARE_PROVIDER_SITE_OTHER): Payer: Medicare Other

## 2023-11-07 DIAGNOSIS — M810 Age-related osteoporosis without current pathological fracture: Secondary | ICD-10-CM | POA: Diagnosis not present

## 2023-11-07 MED ORDER — DENOSUMAB 60 MG/ML ~~LOC~~ SOSY
60.0000 mg | PREFILLED_SYRINGE | Freq: Once | SUBCUTANEOUS | Status: AC
Start: 2023-11-21 — End: 2023-11-07
  Administered 2023-11-07: 60 mg via SUBCUTANEOUS

## 2023-11-07 NOTE — Progress Notes (Signed)
 Pt here for her prolia injection given on the L-upper arm sub-q, pt tols injection well. No c/o per pt. Pt left w/no c/o

## 2023-11-29 ENCOUNTER — Ambulatory Visit: Payer: Medicare Other

## 2023-12-07 ENCOUNTER — Ambulatory Visit: Admitting: Family Medicine

## 2023-12-07 VITALS — BP 118/72 | HR 63 | Temp 98.1°F | Ht 67.0 in | Wt 155.0 lb

## 2023-12-07 DIAGNOSIS — Z0001 Encounter for general adult medical examination with abnormal findings: Secondary | ICD-10-CM

## 2023-12-07 DIAGNOSIS — Z1231 Encounter for screening mammogram for malignant neoplasm of breast: Secondary | ICD-10-CM

## 2023-12-07 DIAGNOSIS — Z0000 Encounter for general adult medical examination without abnormal findings: Secondary | ICD-10-CM

## 2023-12-07 DIAGNOSIS — Z1159 Encounter for screening for other viral diseases: Secondary | ICD-10-CM | POA: Diagnosis not present

## 2023-12-07 DIAGNOSIS — E7800 Pure hypercholesterolemia, unspecified: Secondary | ICD-10-CM

## 2023-12-07 NOTE — Progress Notes (Signed)
 Subjective:   Tammy Mills is a 68 y.o. female who presents for an Initial Medicare Annual Wellness Visit.  Visit Complete: In person  Patient Medicare AWV questionnaire was completed by the patient on 12/07/2023; I have confirmed that all information answered by patient is correct and no changes since this date.  Cardiac Risk Factors include: advanced age (>14men, >1 women)     Objective:    Today's Vitals   12/07/23 1029  BP: 118/72  Pulse: 63  Temp: 98.1 F (36.7 C)  SpO2: 98%  Weight: 155 lb (70.3 kg)  Height: 5\' 7"  (1.702 m)   Body mass index is 24.28 kg/m.     12/07/2023   10:40 AM 11/28/2022    2:47 PM 11/25/2022    4:00 PM 11/18/2022    1:08 PM 04/25/2022   11:03 AM 04/01/2022    2:50 PM 03/21/2022    1:06 PM  Advanced Directives  Does Patient Have a Medical Advance Directive? Yes Yes Yes Yes Yes Yes Yes  Type of Estate agent of [place];Living will Healthcare Power of [place];Living will Healthcare Power of [place];Living will Healthcare Power of [place];Living will Healthcare Power of [place];Living will Healthcare Power of [employer] Power of Attorney  Does patient want to make changes to medical advance directive? No - Patient declined  No - Patient declined  No - Patient declined No - Patient declined   Copy of Healthcare Power of Attorney in Chart? Yes - validated most recent copy scanned in chart (See row information) No - copy requested Yes - validated most recent copy scanned in chart (See row information) Yes - validated most recent copy scanned in chart (See row information)   No - copy requested    Current Medications (verified) Outpatient Encounter Medications as of 12/07/2023  Medication Sig   aspirin 81 MG chewable tablet Chew 1 tablet (81 mg total) by mouth 2 (two) times daily.   Calcium Carbonate-Vitamin D (CALCIUM 600+D PO) Take 1 tablet by mouth 2 (two) times daily.   COVID-19 mRNA vaccine, Pfizer, (COMIRNATY) syringe  Inject into the muscle.   Cyanocobalamin (B-12) 5000 MCG CAPS Take 5,000 mcg by mouth daily.   docusate sodium (COLACE) 100 MG capsule Take 100 mg by mouth daily as needed for mild constipation.   famotidine-calcium carbonate-magnesium hydroxide (PEPCID COMPLETE) 10-800-165 MG chewable tablet Chew 1 tablet by mouth daily as needed (heartburn).   L-Lysine 1000 MG TABS Take 1,000 mg by mouth at bedtime.   loratadine (CLARITIN) 10 MG tablet Take 10 mg by mouth daily as needed for allergies.   Melatonin 10 MG TABS Take 10 mg by mouth at bedtime as needed (for sleep).   meloxicam (MOBIC) 7.5 MG tablet TAKE 1 TABLET BY MOUTH EVERY MORNING AND EVERY NIGHT AT BEDTIME   Multiple Vitamin (MULTIVITAMIN) tablet Take 1 tablet by mouth daily.   naproxen sodium (ALEVE) 220 MG tablet Take 660 mg by mouth daily as needed (knee pain).   Omega-3 Fatty Acids (FISH OIL PO) Take 900 mg by mouth daily.   HYDROcodone-acetaminophen (NORCO/VICODIN) 5-325 MG tablet Take 1-2 tablets by mouth every 6 (six) hours as needed for moderate pain. (Patient not taking: Reported on 12/07/2023)   influenza vaccine adjuvanted (FLUAD) 0.5 ML injection Inject into the muscle. (Patient not taking: Reported on 12/07/2023)   ondansetron (ZOFRAN-ODT) 4 MG disintegrating tablet Take 1 tablet (4 mg total) by mouth every 8 (eight) hours as needed for nausea or vomiting. (Patient not taking: Reported  on 12/07/2023)   tiZANidine (ZANAFLEX) 2 MG tablet TAKE 1 TABLET(2 MG) BY MOUTH EVERY 6 HOURS AS NEEDED FOR MUSCLE SPASMS (Patient not taking: Reported on 12/07/2023)   No facility-administered encounter medications on file as of 12/07/2023.    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   Procedure: LEFT TOTAL KNEE ARTHROPLASTY;  Surgeon: Kathryne Hitch, MD;  Location: [facility];  Service: Orthopedics;  Laterality: Left;   TOTAL KNEE ARTHROPLASTY Right 11/25/2022   Procedure: RIGHT TOTAL KNEE ARTHROPLASTY;  Surgeon: Kathryne Hitch, MD;  Location: [facility];  Service: Orthopedics;  Laterality: Right;   WISDOM TOOTH EXTRACTION     Family History  Problem Relation Age of Onset   Stroke Mother    Cancer Father        hogkins   Colon cancer Maternal Grandfather 34   Cancer Maternal Grandfather        colon   Hyperlipidemia Other    Cancer Paternal Grandmother        colon   Heart attack Neg Hx    Diabetes Neg Hx    Hypertension Neg Hx    Sudden death Neg Hx    Social History   Socioeconomic History   Marital status: Single    Spouse name: Not on file   Number of children: Not on file   Years of education: Not on file   Highest education level: Some college, no degree  Occupational History   Not on file  Tobacco Use   Smoking status: Former    Current packs/day: 0.00    Average packs/day: 0.5 packs/day for 32.0 years (16.0 ttl pk-yrs)    Types: Cigarettes    Start date: 09/05/1973    Quit date: 09/05/2005    Years since quitting: 18.2   Smokeless tobacco: Never  Vaping Use   Vaping status: Never Used  Substance and Sexual Activity   Alcohol use: Yes    Alcohol/week: 3.0 standard drinks of alcohol    Types: 1 Glasses of wine, 2 Cans of beer per week    Comment:  2 beers/ 1 glass of wine on weekends   Drug use: No   Sexual activity: Not on file  Other Topics Concern   Not on file  Social History Narrative   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   Lives with mom and 2 sisters   No children   Single   Enjoys walking/hiking/outdoors   Completed 2 years of college   Social Drivers of Manufacturing engineer Strain: Low Risk  (12/04/2023)   Overall Financial Resource Strain (CARDIA)    Difficulty of Paying Living Expenses: Not hard at all  Food Insecurity: No Food Insecurity (12/07/2023)   Hunger Vital Sign    Worried About Running Out of Food in the Last Year: Never true    Ran Out of Food in the Last Year: Never true  Transportation Needs: No Transportation Needs (12/04/2023)   PRAPARE - Transportation    Lack of  Transportation (Medical): No    Lack of Transportation (Non-Medical): No  Physical Activity: Sufficiently Active (12/04/2023)   Exercise Vital Sign    Days of Exercise per Week: 3 days    Minutes of Exercise per Session: 60 min  Stress: No Stress Concern Present (12/04/2023)   [employer] of Occupational Health - Occupational Stress Questionnaire    Feeling of Stress : Only a little  Social Connections: Unknown (12/04/2023)   Social Connection and Isolation Panel [NHANES]    Frequency of Communication with Friends and Family: Patient declined    Frequency of Social Gatherings with Friends and Family: Patient declined    Attends Religious Services: Never    Database administrator or Organizations: No    Attends Engineer, structural: Not on file    Marital Status: Never married    Tobacco Counseling Counseling given: Not Answered   Clinical Intake:  Pre-visit preparation completed: Yes  Pain : No/denies pain     BMI - recorded: 24.28 Nutritional Status: BMI of 19-24  Normal Nutritional Risks: None Diabetes: No  How often do you need to have someone help you when you read instructions, pamphlets, or other written materials from your doctor or pharmacy?: 1 - Never  Interpreter Needed?: No  Information entered by :: mj Beryle Zeitz, lpn   Activities of Daily Living    12/04/2023    6:46 PM  In your present state of health, do you have any difficulty performing the following activities:  Hearing? 0  Vision? 0  Difficulty  concentrating or making decisions? 0  Walking or climbing stairs? 0  Dressing or bathing? 0  Doing errands, shopping? 0  Preparing Food and eating ? N  Using the Toilet? N  In the past six months, have you accidently leaked urine? N  Do you have problems with loss of bowel control? N  Managing your Medications? N  Managing your Finances? N  Housekeeping or managing your Housekeeping? N    Patient Care Team: Donita Brooks, MD as PCP - General (Family Medicine) Tonny Bollman, MD as PCP - Cardiology (Cardiology)  Indicate any recent Medical Services you may have received from other than [facility] providers in the past year (date may be approximate).     Assessment:   This is a routine wellness examination for [place].  Hearing/Vision screen Hearing Screening - Comments:: Mild hearing loss, no hearing aids.  Vision Screening - Comments:: Glasses, Dr. Shea Evans for eye exams.    Goals Addressed             This Visit's Progress    Exercise 150 min/wk Moderate Activity       Continue to exercise and would like to get up to 3 miles.        Depression Screen    12/07/2023   10:31 AM 11/15/2021    8:35 AM 06/03/2021   12:29 PM 06/19/2020    2:28 PM 12/02/2019    4:06 PM  PHQ 2/9 Scores  PHQ - 2 Score 0 0 0 0 0  PHQ- 9 Score  0       Fall Risk    12/07/2023   10:31 AM 12/04/2023    6:46 PM 11/15/2021    8:35 AM 06/03/2021   12:27 PM 06/19/2020    2:28 PM  Fall Risk   Falls in the past year? 0 0 0 0 0  Number falls in past yr: 0 0 0 0 0  Injury with Fall?  0 0 0 0 0  Risk for fall due to : No Fall Risks No Fall Risks  No Fall Risks   Follow up Falls prevention discussed;Falls evaluation completed    Falls evaluation completed    MEDICARE RISK AT HOME: Medicare Risk at Home Any stairs in or around the home?: (Patient-Rptd) Yes If so, are there any without handrails?: (Patient-Rptd) Yes Home free of loose throw rugs in walkways, pet beds, electrical cords, etc?: (Patient-Rptd)  Yes Adequate lighting in your home to reduce risk of falls?: (Patient-Rptd) Yes Life alert?: (Patient-Rptd) No Use of a cane, walker or w/c?: (Patient-Rptd) No Grab bars in the bathroom?: (Patient-Rptd) Yes Shower chair or bench in shower?: (Patient-Rptd) Yes Elevated toilet seat or a handicapped toilet?: (Patient-Rptd) Yes  TIMED UP AND GO:  Was the test performed? Yes  Length of time to ambulate 10 feet: 8 sec Gait steady and fast without use of assistive device    Cognitive Function:        12/07/2023   10:41 AM 06/19/2020    2:32 PM  6CIT Screen  What Year? 0 points 0 points  What month? 0 points 0 points  What time? 0 points 0 points  Count back from 20 0 points 0 points  Months in reverse 0 points 0 points  Repeat phrase 0 points 0 points  Total Score 0 points 0 points    Immunizations Immunization History  Administered Date(s) Administered   Fluad Quad(high Dose 65+) 07/09/2021, 05/30/2022   Fluad Trivalent(High Dose 65+) 06/12/2023   Influenza Split 06/03/2011   Influenza Whole 07/07/2010   Influenza-Unspecified 06/17/2020   PFIZER Comirnaty(Gray Top)Covid-19 Tri-Sucrose Vaccine 03/30/2021   PFIZER(Purple Top)SARS-COV-2 Vaccination 10/27/2019, 11/27/2019, 07/24/2020   PNEUMOCOCCAL CONJUGATE-20 11/15/2021   Pfizer Covid-19 Vaccine Bivalent Booster 65yrs & up 07/09/2021   Pfizer(Comirnaty)Fall Seasonal Vaccine 12 years and older 08/02/2022, 06/12/2023   Pneumococcal Conjugate-13 02/28/2020   Respiratory Syncytial Virus Vaccine,Recomb Aduvanted(Arexvy) 08/02/2022   Td 06/20/2005   Tdap 09/05/2013   Zoster Recombinant(Shingrix) 01/28/2022, 05/31/2022    TDAP status: Due, Education has been provided regarding the importance of this vaccine. Advised may receive this vaccine at local pharmacy or Health Dept. Aware to provide a copy of the vaccination record if obtained from local pharmacy or Health Dept. Verbalized acceptance and understanding.  Flu Vaccine status:  Up to date  Pneumococcal vaccine status: Up to date  Covid-19 vaccine status: Completed vaccines  Qualifies for Shingles Vaccine? Yes   Zostavax completed Yes   Shingrix Completed?: Yes  Screening Tests Health Maintenance  Topic Date Due   Colonoscopy  07/16/2023   DTaP/Tdap/Td (3 - Td or Tdap) 09/06/2023   COVID-19 Vaccine (8 - 2024-25 season) 12/11/2023   INFLUENZA VACCINE  04/05/2024   Medicare Annual Wellness (AWV)  12/06/2024   MAMMOGRAM  01/02/2025   Pneumonia Vaccine 51+ Years old  Completed   DEXA SCAN  Completed   Zoster Vaccines- Shingrix  Completed   HPV VACCINES  Aged Out   Hepatitis C Screening  Discontinued    Health Maintenance  Health Maintenance Due  Topic Date Due   Colonoscopy  07/16/2023   DTaP/Tdap/Td (3 - Td or Tdap) 09/06/2023   COVID-19 Vaccine (8 - 2024-25 season) 12/11/2023    Colorectal cancer screening: Type of screening: Cologuard. Completed 08/08/2023. Repeat every 3 years  Mammogram status: Completed 01/02/25. Repeat every year  Bone Density status: Completed 05/19/2023. Results reflect: Bone density results: NORMAL. Repeat every 2 years.  Lung Cancer Screening: (  Low Dose CT Chest recommended if Age 23-80 years, 20 pack-year currently smoking OR have quit w/in 15years.) does not qualify.   Lung Cancer Screening Referral: N/A   Additional Screening:  Hepatitis C Screening: does qualify; Completed ORDERED TODAY  Vision Screening: Recommended annual ophthalmology exams for early detection of glaucoma and other disorders of the eye. Is the patient up to date with their annual eye exam?  Yes  Who is the provider or what is the name of the office in which the patient attends annual eye exams? Dr. Shea Evans If pt is not established with a provider, would they like to be referred to a provider to establish care? No .   Dental Screening: Recommended annual dental exams for proper oral hygiene  Diabetic Foot Exam: Diabetic Foot Exam: Completed  N/A  Community Resource Referral / Chronic Care Management: CRR required this visit?  No   CCM required this visit?  No     Plan:    Encounter for Medicare annual wellness exam  Encounter for hepatitis C screening test for low risk patient - Plan: Hepatitis panel, acute  Encounter for screening mammogram for malignant neoplasm of breast - Plan: MM Digital Screening  Pure hypercholesterolemia - Plan: CBC with Differential/Platelet, COMPLETE METABOLIC PANEL WITHOUT GFR, Lipid panel  I have personally reviewed and noted the following in the patient's chart:   Medical and social history Use of alcohol, tobacco or illicit drugs  Current medications and supplements including opioid prescriptions. Patient is currently taking opioid prescriptions. Information provided to patient regarding non-opioid alternatives. Patient advised to discuss non-opioid treatment plan with their provider. Functional ability and status Nutritional status Physical activity Advanced directives List of other physicians Hospitalizations, surgeries, and ER visits in previous 12 months Vitals Screenings to include cognitive, depression, and falls Referrals and appointments  In addition, I have reviewed and discussed with patient certain preventive protocols, quality metrics, and best practice recommendations. A written personalized care plan for preventive services as well as general preventive health recommendations were provided to patient.     Darral Dash, LPN   09/09/7827   After Visit Summary: (In Person-Printed) AVS printed and given to the patient  Nurse Notes: Pt would like to do Hepatitis C screening and Tdap today. Mjp,lpn    I have collaborated with the care management provider regarding care management and care coordination activities outlined in this encounter and have reviewed this encounter including documentation in the note and care plan. I am certifying that I agree with the content  of this note and encounter as supervising physician. I saw the patient with nurse.  Blood pressure today is excellent.  She is due for a mammogram in April.  Therefore I will schedule the mammogram for her.  We will also complete hepatitis C screening today.  Cologuard is not due again until 2027.  Due to her age she does not require Pap smear.  Her immunizations are up-to-date except for a tetanus shot which she defers.  I will check CBC CMP fasting lipid panel.  Ideally I would like to see her LDL cholesterol less then 562.  The patient denies any memory loss, dizziness, falls, or depression.

## 2023-12-08 LAB — LIPID PANEL
Cholesterol: 230 mg/dL — ABNORMAL HIGH (ref <200)
HDL: 73 mg/dL (ref >=50)
LDL Cholesterol (Calc): 136 mg/dL — ABNORMAL HIGH
Non-HDL Cholesterol (Calc): 157 mg/dL — ABNORMAL HIGH (ref <130)
Total CHOL/HDL Ratio: 3.2 (calc) (ref <5.0)
Triglycerides: 106 mg/dL (ref <150)

## 2023-12-08 LAB — CBC WITH DIFFERENTIAL/PLATELET
Absolute Lymphocytes: 1530 {cells}/uL (ref 850–3900)
Absolute Monocytes: 705 {cells}/uL (ref 200–950)
Basophils Absolute: 30 {cells}/uL (ref 0–200)
Basophils Relative: 0.6 %
Eosinophils Absolute: 100 {cells}/uL (ref 15–500)
Eosinophils Relative: 2 %
HCT: 39.3 % (ref 35.0–45.0)
Hemoglobin: 12.8 g/dL (ref 11.7–15.5)
MCH: 31.7 pg (ref 27.0–33.0)
MCHC: 32.6 g/dL (ref 32.0–36.0)
MCV: 97.3 fL (ref 80.0–100.0)
MPV: 10.4 fL (ref 7.5–12.5)
Monocytes Relative: 14.1 %
Neutro Abs: 2635 {cells}/uL (ref 1500–7800)
Neutrophils Relative %: 52.7 %
Platelets: 275 10*3/uL (ref 140–400)
RBC: 4.04 10*6/uL (ref 3.80–5.10)
RDW: 12.5 % (ref 11.0–15.0)
Total Lymphocyte: 30.6 %
WBC: 5 10*3/uL (ref 3.8–10.8)

## 2023-12-08 LAB — COMPLETE METABOLIC PANEL WITHOUT GFR
AG Ratio: 1.6 (calc) (ref 1.0–2.5)
ALT: 10 U/L (ref 6–29)
AST: 20 U/L (ref 10–35)
Albumin: 4.4 g/dL (ref 3.6–5.1)
Alkaline phosphatase (APISO): 31 U/L — ABNORMAL LOW (ref 37–153)
BUN: 18 mg/dL (ref 7–25)
CO2: 24 mmol/L (ref 20–32)
Calcium: 9.7 mg/dL (ref 8.6–10.4)
Chloride: 102 mmol/L (ref 98–110)
Creat: 1.04 mg/dL (ref 0.50–1.05)
Globulin: 2.7 g/dL (ref 1.9–3.7)
Glucose, Bld: 81 mg/dL (ref 65–99)
Potassium: 5 mmol/L (ref 3.5–5.3)
Sodium: 138 mmol/L (ref 135–146)
Total Bilirubin: 0.6 mg/dL (ref 0.2–1.2)
Total Protein: 7.1 g/dL (ref 6.1–8.1)

## 2023-12-08 LAB — HEPATITIS PANEL, ACUTE
Hep A IgM: NONREACTIVE
Hep B C IgM: NONREACTIVE
Hepatitis B Surface Ag: NONREACTIVE
Hepatitis C Ab: NONREACTIVE

## 2023-12-11 ENCOUNTER — Other Ambulatory Visit: Payer: Self-pay | Admitting: Family Medicine

## 2023-12-11 DIAGNOSIS — E7800 Pure hypercholesterolemia, unspecified: Secondary | ICD-10-CM

## 2023-12-11 NOTE — Progress Notes (Signed)
cr

## 2023-12-19 ENCOUNTER — Other Ambulatory Visit: Payer: Self-pay | Admitting: Orthopaedic Surgery

## 2023-12-29 ENCOUNTER — Ambulatory Visit
Admission: RE | Admit: 2023-12-29 | Discharge: 2023-12-29 | Disposition: A | Discharge status: Home / Self Care | Source: Ambulatory Visit | Attending: Family Medicine | Admitting: Family Medicine

## 2023-12-29 DIAGNOSIS — E7800 Pure hypercholesterolemia, unspecified: Secondary | ICD-10-CM

## 2024-01-09 ENCOUNTER — Ambulatory Visit

## 2024-02-06 ENCOUNTER — Ambulatory Visit

## 2024-02-18 ENCOUNTER — Other Ambulatory Visit: Payer: Self-pay | Admitting: Orthopaedic Surgery

## 2024-03-22 ENCOUNTER — Telehealth: Payer: Self-pay

## 2024-03-22 NOTE — Telephone Encounter (Signed)
 Fax received from [employer] to verify next dose of Prolia , which will be 05/09/2024. Mjp,lpn

## 2024-04-08 ENCOUNTER — Telehealth: Payer: Self-pay

## 2024-04-08 NOTE — Telephone Encounter (Signed)
 Prolia  VOB initiated via [email or web address]  Next Prolia  inj DUE: 05/09/24

## 2024-04-10 NOTE — Telephone Encounter (Signed)
 Pt ready for scheduling for PROLIA  on or after : 05/09/24  Option# 1: Buy/Bill (Office supplied medication)  Out-of-pocket cost due at time of clinic visit: $0  Number of injection/visits approved: ---  Primary: MEDICARE Prolia  co-insurance: 0% Admin fee co-insurance: 0%  Secondary: AARP-MEDSUP Prolia  co-insurance:  Admin fee co-insurance:   Medical Benefit Details: Date Benefits were checked: 04/09/24 Deductible: $257 Met of $257 Required/ Coinsurance: 0%/ Admin Fee: 0%  Prior Auth: N/A PA# Expiration Date:   # of doses approved: ----------------------------------------------------------------------- Option# 2- Med Obtained from pharmacy:  Pharmacy benefit: Copay $--- (Paid to pharmacy) Admin Fee: --- (Pay at clinic)  Prior Auth: --- PA# Expiration Date:   # of doses approved:   If patient wants fill through the pharmacy benefit please send prescription to: ---, and include estimated need by date in rx notes. Pharmacy will ship medication directly to the office.  Patient NOT eligible for Prolia  Copay Card. Copay Card can make patient's cost as little as $25. Link to apply: [email or web address]://[email or web address]  ** This summary of benefits is an estimation of the patient's out-of-pocket cost. Exact cost may very based on individual plan coverage.

## 2024-04-10 NOTE — Telephone Encounter (Signed)
 SABRA

## 2024-04-24 ENCOUNTER — Encounter: Payer: Self-pay | Admitting: Family Medicine

## 2024-04-29 ENCOUNTER — Other Ambulatory Visit: Payer: Self-pay | Admitting: Orthopaedic Surgery

## 2024-05-09 ENCOUNTER — Ambulatory Visit

## 2024-07-08 ENCOUNTER — Encounter: Payer: Self-pay | Admitting: Radiology

## 2024-07-10 ENCOUNTER — Other Ambulatory Visit: Payer: Self-pay | Admitting: Orthopaedic Surgery

## 2024-07-25 ENCOUNTER — Encounter: Payer: Self-pay | Admitting: Family Medicine

## 2024-07-31 ENCOUNTER — Ambulatory Visit

## 2024-07-31 DIAGNOSIS — M810 Age-related osteoporosis without current pathological fracture: Secondary | ICD-10-CM

## 2024-07-31 MED ORDER — DENOSUMAB 60 MG/ML ~~LOC~~ SOSY
60.0000 mg | PREFILLED_SYRINGE | SUBCUTANEOUS | Status: AC
  Administered 2024-07-31: 60 mg via SUBCUTANEOUS

## 2024-07-31 NOTE — Progress Notes (Signed)
 Patient is in office today for a nurse visit for  Prolia injection . Patient Injection was given in the  Left arm. Patient tolerated injection well.

## 2024-10-09 ENCOUNTER — Other Ambulatory Visit: Payer: Self-pay | Admitting: Orthopaedic Surgery

## 2024-10-21 ENCOUNTER — Ambulatory Visit: Admitting: Physician Assistant

## 2024-12-12 ENCOUNTER — Ambulatory Visit

## 2024-12-17 ENCOUNTER — Encounter: Admitting: Family Medicine

## 2025-01-28 ENCOUNTER — Ambulatory Visit
